# Patient Record
Sex: Male | Born: 1945 | Race: Black or African American | Hispanic: No | Smoking: Never smoker
Health system: Southern US, Community
[De-identification: ages and names within clinical notes are randomized; demographics above are authoritative.]

## PROBLEM LIST (undated history)

## (undated) DIAGNOSIS — I639 Cerebral infarction, unspecified: Secondary | ICD-10-CM

## (undated) DIAGNOSIS — I10 Essential (primary) hypertension: Secondary | ICD-10-CM

---

## 2001-12-26 ENCOUNTER — Emergency Department (HOSPITAL_COMMUNITY): Admission: AC | Admit: 2001-12-26 | Discharge: 2001-12-27 | Payer: Self-pay

## 2001-12-27 ENCOUNTER — Encounter: Payer: Self-pay | Admitting: Emergency Medicine

## 2003-06-22 ENCOUNTER — Emergency Department (HOSPITAL_COMMUNITY): Admission: AD | Admit: 2003-06-22 | Discharge: 2003-06-22 | Payer: Self-pay | Admitting: Family Medicine

## 2003-06-23 ENCOUNTER — Emergency Department (HOSPITAL_COMMUNITY): Admission: EM | Admit: 2003-06-23 | Discharge: 2003-06-23 | Payer: Self-pay | Admitting: Family Medicine

## 2003-11-05 ENCOUNTER — Inpatient Hospital Stay (HOSPITAL_COMMUNITY): Admission: AC | Admit: 2003-11-05 | Discharge: 2003-11-09 | Payer: Self-pay

## 2010-06-08 ENCOUNTER — Encounter (HOSPITAL_COMMUNITY): Payer: Self-pay | Admitting: Radiology

## 2010-06-08 ENCOUNTER — Inpatient Hospital Stay (HOSPITAL_COMMUNITY)
Admission: EM | Admit: 2010-06-08 | Discharge: 2010-06-12 | DRG: 065 | Disposition: A | Discharge status: Skilled Nursing Facility | Payer: PRIVATE HEALTH INSURANCE | Attending: Internal Medicine | Admitting: Internal Medicine

## 2010-06-08 ENCOUNTER — Emergency Department (HOSPITAL_COMMUNITY): Payer: PRIVATE HEALTH INSURANCE

## 2010-06-08 DIAGNOSIS — Z23 Encounter for immunization: Secondary | ICD-10-CM

## 2010-06-08 DIAGNOSIS — E860 Dehydration: Secondary | ICD-10-CM | POA: Diagnosis present

## 2010-06-08 DIAGNOSIS — R000 Tachycardia, unspecified: Secondary | ICD-10-CM | POA: Diagnosis present

## 2010-06-08 DIAGNOSIS — E119 Type 2 diabetes mellitus without complications: Secondary | ICD-10-CM | POA: Diagnosis present

## 2010-06-08 DIAGNOSIS — Z8673 Personal history of transient ischemic attack (TIA), and cerebral infarction without residual deficits: Secondary | ICD-10-CM

## 2010-06-08 DIAGNOSIS — Z7982 Long term (current) use of aspirin: Secondary | ICD-10-CM

## 2010-06-08 DIAGNOSIS — I672 Cerebral atherosclerosis: Secondary | ICD-10-CM | POA: Diagnosis present

## 2010-06-08 DIAGNOSIS — I10 Essential (primary) hypertension: Secondary | ICD-10-CM | POA: Diagnosis present

## 2010-06-08 DIAGNOSIS — E785 Hyperlipidemia, unspecified: Secondary | ICD-10-CM | POA: Diagnosis present

## 2010-06-08 DIAGNOSIS — Z794 Long term (current) use of insulin: Secondary | ICD-10-CM

## 2010-06-08 DIAGNOSIS — N179 Acute kidney failure, unspecified: Secondary | ICD-10-CM | POA: Diagnosis present

## 2010-06-08 DIAGNOSIS — G8110 Spastic hemiplegia affecting unspecified side: Secondary | ICD-10-CM | POA: Diagnosis present

## 2010-06-08 DIAGNOSIS — R1310 Dysphagia, unspecified: Secondary | ICD-10-CM | POA: Diagnosis present

## 2010-06-08 DIAGNOSIS — I6350 Cerebral infarction due to unspecified occlusion or stenosis of unspecified cerebral artery: Principal | ICD-10-CM | POA: Diagnosis present

## 2010-06-08 DIAGNOSIS — R4701 Aphasia: Secondary | ICD-10-CM | POA: Diagnosis present

## 2010-06-08 HISTORY — DX: Essential (primary) hypertension: I10

## 2010-06-08 HISTORY — DX: Cerebral infarction, unspecified: I63.9

## 2010-06-08 LAB — GLUCOSE, CAPILLARY
Glucose-Capillary: 207 mg/dL — ABNORMAL HIGH (ref 70–99)
Glucose-Capillary: 234 mg/dL — ABNORMAL HIGH (ref 70–99)
Glucose-Capillary: 311 mg/dL — ABNORMAL HIGH (ref 70–99)

## 2010-06-08 LAB — CBC
HCT: 41.2 % (ref 39.0–52.0)
Hemoglobin: 13.9 g/dL (ref 13.0–17.0)
MCH: 31 pg (ref 26.0–34.0)
MCHC: 33.7 g/dL (ref 30.0–36.0)
MCV: 92 fL (ref 78.0–100.0)
RBC: 4.48 MIL/uL (ref 4.22–5.81)

## 2010-06-08 LAB — URINALYSIS, ROUTINE W REFLEX MICROSCOPIC
Hgb urine dipstick: NEGATIVE
Ketones, ur: 15 mg/dL — AB
Leukocytes, UA: NEGATIVE
Protein, ur: NEGATIVE mg/dL
Urine Glucose, Fasting: >1000 mg/dL — AB
pH: 5 (ref 5.0–8.0)

## 2010-06-08 LAB — COMPREHENSIVE METABOLIC PANEL
AST: 12 U/L (ref 0–37)
CO2: 24 mEq/L (ref 19–32)
Calcium: 9 mg/dL (ref 8.4–10.5)
Chloride: 108 mEq/L (ref 96–112)
Creatinine, Ser: 1.79 mg/dL — ABNORMAL HIGH (ref 0.4–1.5)
GFR calc non Af Amer: 38 mL/min — ABNORMAL LOW (ref >60)
Glucose, Bld: 328 mg/dL — ABNORMAL HIGH (ref 70–99)
Total Bilirubin: 0.9 mg/dL (ref 0.3–1.2)

## 2010-06-08 LAB — DIFFERENTIAL
Lymphocytes Relative: 27 % (ref 12–46)
Lymphs Abs: 1.4 10*3/uL (ref 0.7–4.0)
Monocytes Absolute: 0.4 10*3/uL (ref 0.1–1.0)
Monocytes Relative: 7 % (ref 3–12)
Neutro Abs: 3.3 10*3/uL (ref 1.7–7.7)
Neutrophils Relative %: 63 % (ref 43–77)

## 2010-06-08 LAB — RAPID URINE DRUG SCREEN, HOSP PERFORMED
Barbiturates: NOT DETECTED
Benzodiazepines: NOT DETECTED
Cocaine: NOT DETECTED

## 2010-06-08 LAB — CK TOTAL AND CKMB (NOT AT ARMC)
CK, MB: 2 ng/mL (ref 0.3–4.0)
Relative Index: INVALID (ref 0.0–2.5)
Total CK: 62 U/L (ref 7–232)

## 2010-06-08 LAB — URINE MICROSCOPIC-ADD ON

## 2010-06-08 LAB — TROPONIN I: Troponin I: 0.02 ng/mL (ref 0.00–0.06)

## 2010-06-08 LAB — POCT CARDIAC MARKERS: Myoglobin, poc: 149 ng/mL (ref 12–200)

## 2010-06-09 ENCOUNTER — Inpatient Hospital Stay (HOSPITAL_COMMUNITY): Payer: PRIVATE HEALTH INSURANCE

## 2010-06-09 LAB — DIFFERENTIAL
Basophils Absolute: 0 10*3/uL (ref 0.0–0.1)
Lymphocytes Relative: 22 % (ref 12–46)
Lymphs Abs: 1.2 10*3/uL (ref 0.7–4.0)
Neutro Abs: 3.7 10*3/uL (ref 1.7–7.7)
Neutrophils Relative %: 71 % (ref 43–77)

## 2010-06-09 LAB — CBC
HCT: 41.6 % (ref 39.0–52.0)
MCV: 91.8 fL (ref 78.0–100.0)
RBC: 4.53 MIL/uL (ref 4.22–5.81)
WBC: 5.2 10*3/uL (ref 4.0–10.5)

## 2010-06-09 LAB — CARDIAC PANEL(CRET KIN+CKTOT+MB+TROPI)
CK, MB: 2.5 ng/mL (ref 0.3–4.0)
CK, MB: 2.9 ng/mL (ref 0.3–4.0)
Total CK: 80 U/L (ref 7–232)
Total CK: 89 U/L (ref 7–232)
Troponin I: 0.04 ng/mL (ref 0.00–0.06)

## 2010-06-09 LAB — GLUCOSE, CAPILLARY
Glucose-Capillary: 304 mg/dL — ABNORMAL HIGH (ref 70–99)
Glucose-Capillary: 159 mg/dL — ABNORMAL HIGH (ref 70–99)

## 2010-06-09 LAB — BASIC METABOLIC PANEL
BUN: 33 mg/dL — ABNORMAL HIGH (ref 6–23)
Chloride: 113 mEq/L — ABNORMAL HIGH (ref 96–112)
Glucose, Bld: 300 mg/dL — ABNORMAL HIGH (ref 70–99)
Potassium: 4.2 mEq/L (ref 3.5–5.1)
Sodium: 143 mEq/L (ref 135–145)

## 2010-06-09 LAB — LIPID PANEL
Cholesterol: 212 mg/dL — ABNORMAL HIGH (ref 0–200)
HDL: 31 mg/dL — ABNORMAL LOW (ref >39)
LDL Cholesterol: 147 mg/dL — ABNORMAL HIGH (ref 0–99)
Total CHOL/HDL Ratio: 6.8 RATIO
Triglycerides: 172 mg/dL — ABNORMAL HIGH (ref <150)
VLDL: 34 mg/dL (ref 0–40)

## 2010-06-09 LAB — TSH: TSH: 0.344 u[IU]/mL — ABNORMAL LOW (ref 0.350–4.500)

## 2010-06-10 ENCOUNTER — Inpatient Hospital Stay (HOSPITAL_COMMUNITY): Payer: PRIVATE HEALTH INSURANCE

## 2010-06-10 LAB — CBC
Hemoglobin: 11.8 g/dL — ABNORMAL LOW (ref 13.0–17.0)
MCH: 30.1 pg (ref 26.0–34.0)
MCHC: 32.8 g/dL (ref 30.0–36.0)
Platelets: 139 10*3/uL — ABNORMAL LOW (ref 150–400)

## 2010-06-10 LAB — GLUCOSE, CAPILLARY
Glucose-Capillary: 104 mg/dL — ABNORMAL HIGH (ref 70–99)
Glucose-Capillary: 125 mg/dL — ABNORMAL HIGH (ref 70–99)
Glucose-Capillary: 324 mg/dL — ABNORMAL HIGH (ref 70–99)

## 2010-06-10 LAB — BASIC METABOLIC PANEL
CO2: 23 mEq/L (ref 19–32)
GFR calc Af Amer: >60 mL/min (ref >60)
Sodium: 145 mEq/L (ref 135–145)

## 2010-06-10 LAB — SODIUM, URINE, RANDOM: Sodium, Ur: 74 mEq/L

## 2010-06-11 DIAGNOSIS — I6789 Other cerebrovascular disease: Secondary | ICD-10-CM

## 2010-06-11 LAB — GLUCOSE, CAPILLARY
Glucose-Capillary: 115 mg/dL — ABNORMAL HIGH (ref 70–99)
Glucose-Capillary: 132 mg/dL — ABNORMAL HIGH (ref 70–99)
Glucose-Capillary: 166 mg/dL — ABNORMAL HIGH (ref 70–99)

## 2010-06-11 NOTE — H&P (Signed)
NAME:  Robert Mcclure, Robert Mcclure NO.:  [id number]  MEDICAL RECORD NO.:  [id number]           PATIENT TYPE:  E  LOCATION:  [facility]                         FACILITY:  [facility]  PHYSICIAN:  Vania Rea, M.D. DATE OF BIRTH:  1945/07/11  DATE OF ADMISSION:  06/08/2010 DATE OF DISCHARGE:                             HISTORY & PHYSICAL   PRIMARY CARE PHYSICIAN:  Jarome Matin, MD  CHIEF COMPLAINT:  Difficulty walking and sitting.  HISTORY OF PRESENT ILLNESS:  This is a 65 year old African American gentleman with a history of left brain stroke in 2005 and mild right hemiplegia and dysphasia who lives alone and has a nurse's aide who visits daily to help with cooking, cleaning, and activities of daily living.  The patient says he has not noticed any change in his status. However, his nurse's aide called EMS and called his friend who helps to look after his fares because she said he had difficulty sitting up and had difficulty walking.  His friend who is present at the interview also indicates that his speech is different.  The patient denies any headache, chest pain, shortness of breath.  He denies any falls.  He denies any frequency or dysuria.  He says he has been eating as normal.  PAST MEDICAL HISTORY: 1. Hypertension. 2. Diabetes. 3. Stroke as noted above.  MEDICATIONS: 1. Lisinopril/HCTZ 20/25 one daily. 2. Metformin 1000 mg twice daily. 3. Amlodipine 10 mg daily. 4. Clonidine 0.3 mg 3 times daily. 5. Actos 30 mg daily. 6. Glipizide 10 mg daily.  ALLERGIES:  No known drug allergies.  SOCIAL HISTORY:  Denies tobacco, alcohol, or illicit drug use.  Lives alone as noted above.  He is divorced.  He has 3 grown children.  He has a friend who have a lady friend who helps to look after him and is present at the interview.  FAMILY HISTORY:  Significant for a mother who had breast cancer.  Father had lung cancer.  Sister who had a stroke in her 30s.  REVIEW OF  SYSTEMS:  Other than noted above, significant only for the fact that his blood sugars tend to run in the 200s and 300s despite taking his medications regularly.  His friend feels that he should be on insulin.  Other than this, a complete review of systems is unremarkable.  PHYSICAL EXAMINATION:  GENERAL:  Pleasant middle-aged African American gentleman, reclining in the stretcher, very difficulty understanding his speech because he takes a long time to get it out and it is not always clear what he is saying. VITAL SIGNS:  His temperature is 97.6, pulse 84, respiration 19, blood pressure 153/99, he is saturating 100% on room air. HEENT:  His pupils are round and equal.  Mucous membranes are pink. Anicteric.  No cervical lymphadenopathy.  He does not have facial asymmetry.  No thyromegaly or carotid bruits. CHEST:  Clear to auscultation bilaterally. CARDIOVASCULAR:  Regular rhythm.  No murmur. ABDOMEN:  Mildly obese, soft, nontender.  No masses. EXTREMITIES:  He has wasting of the small muscles of the right hand and wasting of the thenar eminence of the left hand, has arthritic deformities of  both knees, right greater than left.  He has no edema. CENTRAL NERVOUS SYSTEM:  He has speech impairment as noted above, but no facial asymmetry.  He does appear to be edentulous though.  His power in the right upper extremity is 3/5, power in the left upper extremity is 4/5 with 5/5 being full power, but he cannot lift both arms against gravity easily.  It is difficult to assess the strength of the lower extremities since he seems to have difficulty cooperating with the exam. Reflexes were equal and brisk in both upper extremities which could not be elicited in lower extremities.  Babinski appeared to be upgoing on the right upper extremity and downgoing on the left lower extremity. Gait was not tested.  LABORATORY DATA:  CBC is unremarkable with a white count of 5.2, hemoglobin 13.9, and  platelets 160 with a normal differential.  His sodium is 141, potassium 4.8, chloride 108, CO2 of 24, glucose 328, BUN 49, and creatinine elevated to 1.79.  His calcium was 9.0 and his liver functions were unremarkable.  Urine drug screen was negative for all tested constituents.  Urinalysis had greater than 1000 glucose, but was otherwise unremarkable.  Urine microscopy was unremarkable.  IMAGING DATA:  Two-view chest x-ray showed no acute finding.  CT scan of the brain showed an age indeterminate lacunar infarct involving the anterior limb of the right internal capsule, new since 2005, old left MCA distribution cerebral infarct, and chronic small vessel ischemic disease.  MRI of the brain without contrast showed multifocal areas of right basal ganglia infarction within the right MCA lenticulostriate territory.  No hemorrhage or midline shift.  ASSESSMENT: 1. Acute right basal ganglia infarct. 2. Diabetes type 2 uncontrolled. 3. Hypertension. 4. Renal failure probably acute due to dehydration.  PLAN: 1. We will admit this gentleman to the Stroke Unit for further stroke     workup and risks stratification.  I have discussed with he and his     friend the importance of having his blood pressure, blood sugars,     and his lipids well-controlled.  Neurologists was already been     consulted and we await her input.  Since this gentleman has     automatically failed a swallowing screen, he will be evaluated by     the speech therapist and his diet adjusted     accordingly.  For the time being, we will keep him on a sensitive     sliding scale insulin for management of his diabetes. 2. Other plans as per orders.     Vania Rea, M.D.     LC/MEDQ  D:  06/08/2010  T:  06/08/2010  Job:  [id number]  cc:   Jarome Matin, M.D.  Electronically Signed by Vania Rea M.D. on 06/11/2010 04:54:09 AM

## 2010-06-12 LAB — GLUCOSE, CAPILLARY: Glucose-Capillary: 157 mg/dL — ABNORMAL HIGH (ref 70–99)

## 2010-06-14 NOTE — Discharge Summary (Signed)
NAME:  Robert Mcclure, Robert Mcclure NO.:  [id number]  MEDICAL RECORD NO.:  [id number]           PATIENT TYPE:  I  LOCATION:  3703                         FACILITY:  [facility]  PHYSICIAN:  Andreas Blower, MD       DATE OF BIRTH:  [birth year]  DATE OF ADMISSION:  06/08/2010 DATE OF DISCHARGE:                        DISCHARGE SUMMARY - REFERRING   PRIMARY CARE PHYSICIAN:  Jarome Matin, MD  DISCHARGE DIAGNOSES: 1. Acute right basal ganglia infarct within the right middle cerebral     artery/lenticulostriate territory. 2. Acute renal failure, resolved. 3. Type 2 diabetes. 4. Hypertension. 5. Dysphagia.  Currently on a dysphagia III diet and thin liquids. 6. Tachycardia resolved. 7. Suppressed TSH was normal free T4. 8. History of CVA.  DISCHARGE MEDICATIONS: 1. Acetaminophen 650 mg every 6 years as needed. 2. Aspirin 325 mg p.o. daily. 3. Glipizide 5 mg p.o. twice daily with meals. 4. Metoprolol 25 mg p.o. twice daily. 5. Simvastatin 20 mg p.o. daily. 6. Thick-It food thickener 227 g as needed. 7. Pioglitazone 30 mg p.o. daily. 8. Amlodipine 10 mg p.o. daily. 9. Clonidine 0.3 mg p.o. 3 times a day. 10.Lantus 5 units subcu daily. 11.NovoLog 1-9 units subcu before meals and at bedtime.  BRIEF ADMITTING HISTORY AND PHYSICAL:  Robert Mcclure is a 65 year old African American gentleman with history of CVA, hypertension, and diabetes who presented on June 08, 2010, with difficulty walking and sitting.  RADIOLOGY/IMAGING DATA:  The patient had a chest x-ray two-view on June 08, 2010, which shows no acute findings.  The patient had a head CT without contrast, which was age indeterminate lacunar infarct involving the anterior limb of the right internal capsule, which is new since 2005.  Old left MCA distribution of cerebellar infarct.  Chronic small-vessel disease.  The patient had MRI of the brain, which showed multifocal areas of right basal ganglia infarct  within the right MCA/lenticulostriate territory. No hemorrhage or midline shift.  The patient had a renal ultrasound on June 09, 2010, which shows no hydronephrosis.  Echogenicity of right parenchyma is within normal limits.  Slightly prominent prostate.  The patient had 2-D echocardiogram, which showed cavity size was normal, wall thickness was increased in the pattern of mild to moderate LVH. There was moderate focal basal hypertrophy of the septum.  Systolic function was normal.  Ejection fraction was 55-65%.  Wall motion was normal.  There were no regional wall motion abnormalities.  Aortic valve was mildly calcified.  Pulmonary artery peak pressure was 34 mmHg.  The patient had carotid Dopplers on June 09, 2010, which showed no evidence of significant ICA stenosis.  CONSULTATIONS:  [facility] evaluated the patient during the course of the hospital stay.  HOSPITAL COURSE: 1. Acute right basil ganglia CVA.  Neurology was consulted.  The     patient had the above imaging.  The patient was seen by Neurology     and started the patient on aspirin.  Given his dysphagia, Speech     Therapy was also consulted and he was started on a dysphagia diet.     The patient was evaluated by physical therapy who thought that  the     patient would benefit from rehab at skilled nursing facility, which     will be arranged at the time of discharge. 2. Acute renal failure resolved most likely prerenal in etiology. 3. Hypertension.  Blood pressure improved during the course of the     hospital stay.  The patient had his lisinopril and     hydrochlorothiazide medication held and was started on metoprolol.     Further titration of antihypertensive medications should be done as     an outpatient. 4. Dysphagia.  The patient was evaluated by Speech Therapy and was     started on a dysphagia III diet with thin liquids. 5. Suppressed TSH with normal free T4.  The patient to have his labs      checked as an outpatient. 6. Diabetes.  The patient was continued on home dose of pioglitazone.     His glipizide was changed to 5 mg twice daily.  The patient was     started on Lantus and low-dose NovoLog for coverage during the     course of the hospital stay.  Further titration of that diabetic     medications as outpatient. 7. Tachycardia resolved during the course of the hospital stay.  The     patient was started on metoprolol 25 mg p.o. twice daily.  DISPOSITION AND FOLLOWUP:  The patient to follow up with Neurology as outpatient in 1-2 months.  The patient to follow up with his primary care physician within in 1-2 weeks.  Time spent on discharge talking to the patient, talking to consultants, and coordinating care was 35 minutes.     Andreas Blower, MD     SR/MEDQ  D:  06/12/2010  T:  06/12/2010  Job:  [id number]  cc:   Jarome Matin, M.D.  Electronically Signed by Wardell Heath Jaquayla Hege  on 06/14/2010 09:08:46 PM

## 2010-06-15 NOTE — Consult Note (Signed)
  NAME:  Robert Mcclure, BLANKENHORN NO.:  [id number]  MEDICAL RECORD NO.:  [id number]           PATIENT TYPE:  E  LOCATION:  [facility]                         FACILITY:  [facility]  PHYSICIAN:  Levert Feinstein, MD          DATE OF BIRTH:  December 10, 1945  DATE OF CONSULTATION: DATE OF DISCHARGE:                                CONSULTATION   CHIEF COMPLAINT:  Acute stroke.  HISTORY OF PRESENT ILLNESS:  The patient is a 65 year old right-handed African American male with past medical history of left MCA stroke in 2005.  At baseline, he has aphasia, spastic right hemiparesis, but ambulatory, takes care of personal needs, has aides coming regularly. Monday morning, when the aides came in, he was found to slump over, had increased gait difficulty.  Over the past 2 days, he had persistent gait difficulty, left-sided weakness, prompted ED visit.  MRI of the brain demonstrated a new acute stroke in right basal ganglion.  REVIEW OF SYSTEMS:  Pertinent as above.  PAST MEDICAL HISTORY: 1. Hypertension. 2. Diabetes. 3. Stroke.  MEDICATIONS:  Lisinopril, metformin, amlodipine, clonidine, Actos, glipizide.  ALLERGIES:  No known drug allergy.  FAMILY HISTORY:  Mother had breast cancer, father had colon cancer, in their 46s.  Denies smoking, drinking, illicit drug use.  PHYSICAL EXAMINATION:  VITAL SIGNS:  Temperature 97.6, blood pressure 153/99, heart rate of 84. CARDIAC:  Regular rate and rhythm. NEUROLOGIC:  He has paraphasic errors, slow to response, and following commands. Cranial nerves II through XII:  He had right visual field cut.  Pupils were equal, round, and reactive to light.  Extraocular movements were full.  Facial sensation was normal.  No significant asymmetry.  Uvula and tongue midline.  Head turning, shoulder shrugging normal and symmetric. Motor:  Mild spastic right hemiparesis, upper extremity 4+/5, lower extremity 4/5.  There was slight drift of left upper and  lower extremities. Sensory:  Not reliable and there was no extinction, no dysmetria.  LABORATORY DATA:  Creatinine 1.8.  Normal CBC.  Elevated glucose at 328. UDS negative.  INR 1.03.  ASSESSMENT/PLAN:  Acute right basal ganglion stroke, multiple vascular risk factors, previous stroke, hypertension, poorly controlled diabetes, hyperlipidemia. 1. Complete stroke evaluation including echo, ultrasound of carotid     artery. 2. Aspirin 325 mg every day. 3. Physical therapy, occupational therapy.     Levert Feinstein, MD     YY/MEDQ  D:  06/08/2010  T:  06/09/2010  Job:  [id number]  Electronically Signed by Levert Feinstein MD on 06/15/2010 07:46:22 AM

## 2012-01-23 ENCOUNTER — Encounter (HOSPITAL_COMMUNITY): Payer: Self-pay | Admitting: Emergency Medicine

## 2012-01-23 ENCOUNTER — Emergency Department (HOSPITAL_COMMUNITY): Payer: PRIVATE HEALTH INSURANCE

## 2012-01-23 ENCOUNTER — Inpatient Hospital Stay (HOSPITAL_COMMUNITY)
Admission: EM | Admit: 2012-01-23 | Discharge: 2012-01-27 | DRG: 683 | Disposition: A | Discharge status: Home Health | Payer: PRIVATE HEALTH INSURANCE | Attending: Internal Medicine | Admitting: Internal Medicine

## 2012-01-23 DIAGNOSIS — F0150 Vascular dementia without behavioral disturbance: Secondary | ICD-10-CM | POA: Diagnosis present

## 2012-01-23 DIAGNOSIS — I69959 Hemiplegia and hemiparesis following unspecified cerebrovascular disease affecting unspecified side: Secondary | ICD-10-CM

## 2012-01-23 DIAGNOSIS — Z794 Long term (current) use of insulin: Secondary | ICD-10-CM

## 2012-01-23 DIAGNOSIS — I672 Cerebral atherosclerosis: Secondary | ICD-10-CM | POA: Diagnosis present

## 2012-01-23 DIAGNOSIS — D649 Anemia, unspecified: Secondary | ICD-10-CM | POA: Diagnosis present

## 2012-01-23 DIAGNOSIS — N182 Chronic kidney disease, stage 2 (mild): Secondary | ICD-10-CM | POA: Diagnosis present

## 2012-01-23 DIAGNOSIS — N179 Acute kidney failure, unspecified: Secondary | ICD-10-CM | POA: Diagnosis present

## 2012-01-23 DIAGNOSIS — I674 Hypertensive encephalopathy: Secondary | ICD-10-CM | POA: Diagnosis present

## 2012-01-23 DIAGNOSIS — I69921 Dysphasia following unspecified cerebrovascular disease: Secondary | ICD-10-CM

## 2012-01-23 DIAGNOSIS — E1169 Type 2 diabetes mellitus with other specified complication: Secondary | ICD-10-CM | POA: Diagnosis present

## 2012-01-23 DIAGNOSIS — E119 Type 2 diabetes mellitus without complications: Secondary | ICD-10-CM | POA: Diagnosis present

## 2012-01-23 DIAGNOSIS — Z79899 Other long term (current) drug therapy: Secondary | ICD-10-CM

## 2012-01-23 DIAGNOSIS — I10 Essential (primary) hypertension: Secondary | ICD-10-CM | POA: Diagnosis present

## 2012-01-23 DIAGNOSIS — I129 Hypertensive chronic kidney disease with stage 1 through stage 4 chronic kidney disease, or unspecified chronic kidney disease: Principal | ICD-10-CM | POA: Diagnosis present

## 2012-01-23 DIAGNOSIS — I160 Hypertensive urgency: Secondary | ICD-10-CM

## 2012-01-23 DIAGNOSIS — I639 Cerebral infarction, unspecified: Secondary | ICD-10-CM | POA: Diagnosis present

## 2012-01-23 DIAGNOSIS — R569 Unspecified convulsions: Secondary | ICD-10-CM

## 2012-01-23 HISTORY — DX: Cerebral infarction, unspecified: I63.9

## 2012-01-23 LAB — TROPONIN I: Troponin I: <0.3 ng/mL (ref <0.30)

## 2012-01-23 LAB — COMPREHENSIVE METABOLIC PANEL
ALT: 11 U/L (ref 0–53)
Alkaline Phosphatase: 48 U/L (ref 39–117)
CO2: 23 mEq/L (ref 19–32)
GFR calc Af Amer: 70 mL/min — ABNORMAL LOW (ref >90)
GFR calc non Af Amer: 60 mL/min — ABNORMAL LOW (ref >90)
Glucose, Bld: 211 mg/dL — ABNORMAL HIGH (ref 70–99)
Potassium: 4 mEq/L (ref 3.5–5.1)
Sodium: 141 mEq/L (ref 135–145)

## 2012-01-23 LAB — CBC WITH DIFFERENTIAL/PLATELET
Eosinophils Relative: 0 % (ref 0–5)
Hemoglobin: 12.6 g/dL — ABNORMAL LOW (ref 13.0–17.0)
Lymphocytes Relative: 12 % (ref 12–46)
Lymphs Abs: 1 10*3/uL (ref 0.7–4.0)
MCV: 92.6 fL (ref 78.0–100.0)
Neutrophils Relative %: 83 % — ABNORMAL HIGH (ref 43–77)
Platelets: 172 10*3/uL (ref 150–400)
RBC: 4.03 MIL/uL — ABNORMAL LOW (ref 4.22–5.81)
WBC: 8.8 10*3/uL (ref 4.0–10.5)

## 2012-01-23 LAB — CREATININE, SERUM
Creatinine, Ser: 1.37 mg/dL — ABNORMAL HIGH (ref 0.50–1.35)
GFR calc non Af Amer: 52 mL/min — ABNORMAL LOW (ref >90)

## 2012-01-23 LAB — CBC
Hemoglobin: 11.9 g/dL — ABNORMAL LOW (ref 13.0–17.0)
MCHC: 33.4 g/dL (ref 30.0–36.0)
Platelets: 162 10*3/uL (ref 150–400)

## 2012-01-23 MED ORDER — LOSARTAN POTASSIUM 50 MG PO TABS: 100.0000 mg | ORAL_TABLET | Freq: Every day | ORAL | Status: DC

## 2012-01-23 MED ORDER — ONDANSETRON HCL 4 MG PO TABS: 4.0000 mg | ORAL_TABLET | Freq: Four times a day (QID) | ORAL | Status: DC | PRN

## 2012-01-23 MED ORDER — LOSARTAN POTASSIUM 50 MG PO TABS
100.0000 mg | ORAL_TABLET | Freq: Every day | ORAL | Status: DC
  Administered 2012-01-24: 100 mg via ORAL
  Filled 2012-01-23 (×2): qty 2

## 2012-01-23 MED ORDER — ONDANSETRON HCL 4 MG/2ML IJ SOLN: 4.0000 mg | Freq: Four times a day (QID) | INTRAMUSCULAR | Status: DC | PRN

## 2012-01-23 MED ORDER — ZOLPIDEM TARTRATE 5 MG PO TABS
5.0000 mg | ORAL_TABLET | Freq: Every day | ORAL | Status: DC
  Administered 2012-01-23 – 2012-01-25 (×3): 5 mg via ORAL
  Filled 2012-01-23 (×3): qty 1

## 2012-01-23 MED ORDER — LABETALOL HCL 5 MG/ML IV SOLN
20.0000 mg | Freq: Once | INTRAVENOUS | Status: AC
  Administered 2012-01-23: 20 mg via INTRAVENOUS
  Filled 2012-01-23: qty 4

## 2012-01-23 MED ORDER — BISACODYL 5 MG PO TBEC: 5.0000 mg | DELAYED_RELEASE_TABLET | Freq: Every day | ORAL | Status: DC | PRN

## 2012-01-23 MED ORDER — GLIPIZIDE ER 10 MG PO TB24
10.0000 mg | ORAL_TABLET | Freq: Two times a day (BID) | ORAL | Status: DC
  Administered 2012-01-24: 10 mg via ORAL
  Filled 2012-01-23 (×3): qty 1

## 2012-01-23 MED ORDER — ASPIRIN 325 MG PO TABS
325.0000 mg | ORAL_TABLET | Freq: Every day | ORAL | Status: DC
  Administered 2012-01-24 – 2012-01-27 (×4): 325 mg via ORAL
  Filled 2012-01-23 (×4): qty 1

## 2012-01-23 MED ORDER — SODIUM CHLORIDE 0.9 % IJ SOLN
3.0000 mL | Freq: Two times a day (BID) | INTRAMUSCULAR | Status: DC
  Administered 2012-01-23 – 2012-01-27 (×8): 3 mL via INTRAVENOUS

## 2012-01-23 MED ORDER — AMLODIPINE BESYLATE 10 MG PO TABS
10.0000 mg | ORAL_TABLET | Freq: Every day | ORAL | Status: DC
  Administered 2012-01-24 – 2012-01-27 (×4): 10 mg via ORAL
  Filled 2012-01-23 (×4): qty 1

## 2012-01-23 MED ORDER — DONEPEZIL HCL 10 MG PO TABS
10.0000 mg | ORAL_TABLET | Freq: Every day | ORAL | Status: DC
  Administered 2012-01-24 – 2012-01-27 (×4): 10 mg via ORAL
  Filled 2012-01-23 (×4): qty 1

## 2012-01-23 MED ORDER — LABETALOL HCL 200 MG PO TABS
200.0000 mg | ORAL_TABLET | Freq: Two times a day (BID) | ORAL | Status: DC
  Administered 2012-01-23 – 2012-01-27 (×8): 200 mg via ORAL
  Filled 2012-01-23 (×9): qty 1

## 2012-01-23 MED ORDER — INSULIN GLARGINE 100 UNIT/ML ~~LOC~~ SOLN
36.0000 [IU] | Freq: Every day | SUBCUTANEOUS | Status: DC
  Administered 2012-01-23 – 2012-01-25 (×3): 36 [IU] via SUBCUTANEOUS

## 2012-01-23 MED ORDER — SODIUM CHLORIDE 0.9 % IV SOLN: 250.0000 mL | INTRAVENOUS | Status: DC | PRN

## 2012-01-23 MED ORDER — FUROSEMIDE 20 MG PO TABS: 20.0000 mg | ORAL_TABLET | ORAL | Status: DC

## 2012-01-23 MED ORDER — INSULIN ASPART 100 UNIT/ML ~~LOC~~ SOLN
0.0000 [IU] | Freq: Every day | SUBCUTANEOUS | Status: DC
  Administered 2012-01-23 – 2012-01-25 (×2): 2 [IU] via SUBCUTANEOUS

## 2012-01-23 MED ORDER — LABETALOL HCL 5 MG/ML IV SOLN
2.0000 mg/min | INTRAVENOUS | Status: DC
  Filled 2012-01-23: qty 100

## 2012-01-23 MED ORDER — ENOXAPARIN SODIUM 40 MG/0.4ML ~~LOC~~ SOLN
40.0000 mg | SUBCUTANEOUS | Status: DC
  Administered 2012-01-23 – 2012-01-26 (×4): 40 mg via SUBCUTANEOUS
  Filled 2012-01-23 (×5): qty 0.4

## 2012-01-23 MED ORDER — METOPROLOL TARTRATE 100 MG PO TABS
100.0000 mg | ORAL_TABLET | Freq: Two times a day (BID) | ORAL | Status: DC
  Administered 2012-01-23: 100 mg via ORAL
  Filled 2012-01-23: qty 1

## 2012-01-23 MED ORDER — CLONIDINE HCL 0.3 MG PO TABS
0.3000 mg | ORAL_TABLET | Freq: Three times a day (TID) | ORAL | Status: DC
  Administered 2012-01-23 – 2012-01-27 (×11): 0.3 mg via ORAL
  Filled 2012-01-23 (×14): qty 1

## 2012-01-23 MED ORDER — INSULIN ASPART 100 UNIT/ML ~~LOC~~ SOLN
0.0000 [IU] | Freq: Three times a day (TID) | SUBCUTANEOUS | Status: DC
  Administered 2012-01-24 (×2): 2 [IU] via SUBCUTANEOUS
  Administered 2012-01-25 – 2012-01-26 (×3): 3 [IU] via SUBCUTANEOUS
  Administered 2012-01-26: 11 [IU] via SUBCUTANEOUS

## 2012-01-23 MED ORDER — SODIUM CHLORIDE 0.9 % IJ SOLN: 3.0000 mL | INTRAMUSCULAR | Status: DC | PRN

## 2012-01-23 MED ORDER — HYDRALAZINE HCL 20 MG/ML IJ SOLN
10.0000 mg | INTRAMUSCULAR | Status: DC | PRN
  Filled 2012-01-23: qty 0.5

## 2012-01-23 MED ORDER — ACETAMINOPHEN 325 MG PO TABS: 650.0000 mg | ORAL_TABLET | Freq: Four times a day (QID) | ORAL | Status: DC | PRN

## 2012-01-23 MED ORDER — PIOGLITAZONE HCL 30 MG PO TABS
30.0000 mg | ORAL_TABLET | Freq: Every day | ORAL | Status: DC
  Administered 2012-01-24 – 2012-01-27 (×4): 30 mg via ORAL
  Filled 2012-01-23 (×5): qty 1

## 2012-01-23 MED ORDER — HYDRALAZINE HCL 20 MG/ML IJ SOLN
10.0000 mg | Freq: Once | INTRAMUSCULAR | Status: AC
  Administered 2012-01-23: 10 mg via INTRAVENOUS
  Filled 2012-01-23: qty 1

## 2012-01-23 MED ORDER — LABETALOL HCL 5 MG/ML IV SOLN
10.0000 mg | INTRAVENOUS | Status: DC | PRN
  Administered 2012-01-23: 10 mg via INTRAVENOUS
  Filled 2012-01-23: qty 4

## 2012-01-23 MED ORDER — ACETAMINOPHEN 650 MG RE SUPP: 650.0000 mg | Freq: Four times a day (QID) | RECTAL | Status: DC | PRN

## 2012-01-23 MED ORDER — SIMVASTATIN 20 MG PO TABS
20.0000 mg | ORAL_TABLET | Freq: Every evening | ORAL | Status: DC
  Administered 2012-01-23 – 2012-01-24 (×2): 20 mg via ORAL
  Filled 2012-01-23 (×3): qty 1

## 2012-01-23 MED ORDER — SODIUM CHLORIDE 0.9 % IV SOLN
INTRAVENOUS | Status: DC
  Administered 2012-01-23: 125 mL via INTRAVENOUS

## 2012-01-23 NOTE — ED Notes (Signed)
Nephew came home from work and his uncle was shaking alert called EMS [facility] 151 with BP 240/130 and 200/121 EKG NSR.

## 2012-01-23 NOTE — ED Notes (Signed)
Pt given urinal and asked to call us to assist with a sample as soon as able.

## 2012-01-23 NOTE — Consult Note (Signed)
Name: Robert Mcclure MRN: [id number] DOB: [birth year]    LOS: 0  Referring Provider:  [facility] Reason for Referral:  Hypertensive urgency  PULMONARY / CRITICAL CARE MEDICINE  The patient is sedated, intubated and unable to provide history, which was obtained for available medical records.    HPI:  66 yo with recent CVA, vascular dementia and HTN on multiple medications brought to [facility] ED after having "shaking spell" and found to by hypertensive 220/128.  Past Medical History  Diagnosis Date  . Hypertension   . Diabetes mellitus   . CVA (cerebral vascular accident)   . Stroke    History reviewed. No pertinent past surgical history. Prior to Admission medications   Medication Sig Start Date End Date Taking? Authorizing Provider  amLODipine (NORVASC) 10 MG tablet Take 10 mg by mouth daily.   Yes Historical Provider, MD  aspirin 325 MG tablet Take 325 mg by mouth daily.   Yes Historical Provider, MD  cloNIDine (CATAPRES) 0.3 MG tablet Take 0.3 mg by mouth every 8 (eight) hours.   Yes Historical Provider, MD  donepezil (ARICEPT) 10 MG tablet Take 10 mg by mouth daily.   Yes Historical Provider, MD  furosemide (LASIX) 20 MG tablet Take 20 mg by mouth every Monday, Wednesday, and Friday.   Yes Historical Provider, MD  glipiZIDE (GLUCOTROL XL) 10 MG 24 hr tablet Take 10 mg by mouth 2 (two) times daily.   Yes Historical Provider, MD  insulin glargine (LANTUS) 100 UNIT/ML injection Inject 36 Units into the skin at bedtime.   Yes Historical Provider, MD  losartan (COZAAR) 100 MG tablet Take 100 mg by mouth daily.   Yes Historical Provider, MD  metFORMIN (GLUCOPHAGE) 500 MG tablet Take 500 mg by mouth 2 (two) times daily with a meal.   Yes Historical Provider, MD  metoprolol (LOPRESSOR) 100 MG tablet Take 100 mg by mouth 2 (two) times daily.   Yes Historical Provider, MD  pioglitazone (ACTOS) 30 MG tablet Take 30 mg by mouth daily.   Yes Historical Provider, MD  simvastatin (ZOCOR) 20 MG tablet  Take 20 mg by mouth every evening.   Yes Historical Provider, MD  zolpidem (AMBIEN) 5 MG tablet Take 5 mg by mouth at bedtime.   Yes Historical Provider, MD   Allergies No Known Allergies  Family History No family history on file.  Social History  reports that he has never smoked. He does not have any smokeless tobacco history on file. He reports that he does not drink alcohol or use illicit drugs.  Review Of Systems:  Unable to provide  Vital Signs: Temp:  [99 F (37.2 C)-99.5 F (37.5 C)] 99.5 F (37.5 C) (10/08 1930) Pulse Rate:  [75-105] 105  (10/08 1930) Resp:  [16-27] 19  (10/08 1930) BP: (131-220)/(100-133) 213/120 mmHg (10/08 1930) SpO2:  [97 %-100 %] 98 % (10/08 1930) Weight:  [93.8 kg (206 lb 12.7 oz)] 93.8 kg (206 lb 12.7 oz) (10/08 1930)  Physical Examination: General: No acute distress Neuro:  A x O to self, left sided hemiparesis (baseline) HEENT:  PERRL Neck:  No JVD Cardiovascular:  RRR, no m/r/g Lungs:  Bilateral air entry, no w/r/r Abdomen:  Soft, nontender, nondistended, bowel sounds present Musculoskeletal:  Moves allextremities Skin:  No rash  Labs:  Lab 01/23/12 1933 01/23/12 1056 01/23/12 1008  HGB 11.9* -- 12.6*  WBC 8.4 -- 8.8  PLT 162 -- 172  NA -- -- 141  K -- -- 4.0  CL -- --  107  CO2 -- -- 23  GLUCOSE -- -- 211*  BUN -- -- 21  CREATININE 1.37* -- 1.21  CALCIUM -- -- 9.1  MG -- -- --  PHOS -- -- --  AST -- -- 15  ALT -- -- 11  ALKPHOS -- -- 48  BILITOT -- -- 0.4  PROT -- -- 7.3  ALBUMIN -- -- 3.6  INR -- 1.08 --  APTT -- 37 --  INR -- 1.08 --  LATICACIDVEN -- -- --  TROPONINI <0.30 -- --  PHART -- -- --  PCO2ART -- -- --  PO2ART -- -- --  HCO3 -- -- --  O2SAT -- -- --   Imaging: Ct Head Wo Contrast  01/23/2012  *RADIOLOGY REPORT*  Clinical Data: Tremors with urinary incontinence.  CT HEAD WITHOUT CONTRAST  Technique:  Contiguous axial images were obtained from the base of the skull through the vertex without contrast.   Comparison: None.  Findings: There is no evidence for acute infarction, intracranial hemorrhage, mass lesion, hydrocephalus, or extra-axial fluid. There is mild to moderate premature atrophy with chronic microvascular ischemic change.  There is a moderate sized remote left hemisphere infarct affecting the left basal ganglia, external capsule, insular cortex, parietal cortex, temporal cortex, and associated subcortical white matter.  Compensatory enlargement left lateral ventricle has resulted. There is a smaller area of chronic infarction affecting the right basal ganglia.  The calvarium is intact.  There is advanced vascular calcification. There are no CT signs of proximal vascular thrombosis.  Paranasal sinuses and mastoids are unremarkable.  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 Smaller right basal ganglia infarct.  Atrophy with chronic microvascular ischemic change.  No visible acute stroke or intracranial hemorrhage.   Original Report Authenticated By: Elsie Stain, M.D.    Dg Chest Portable 1 View  01/23/2012  *RADIOLOGY REPORT*  Clinical Data: Hypertension.  Diabetic.  Nonsmoker.  PORTABLE CHEST - 1 VIEW  Comparison: None.  Findings: Cardiomegaly.  Scoliosis with mildly tortuous aorta.  Central pulmonary vascular prominence.  No infiltrate, congestive heart failure or pneumothorax.  The patient would eventually benefit from follow-up two-view chest with cardiac leads removed.  IMPRESSION: Cardiomegaly.  Scoliosis with mildly tortuous aorta.  Central pulmonary vascular prominence.  No infiltrate, congestive heart failure or pneumothorax.  The patient would eventually benefit from follow-up two-view chest with cardiac leads removed.   Original Report Authenticated By: Fuller Canada, M.D.    ASSESSMENT AND PLAN  Hypertension Hypertensive urgency (no evidence of end-organ damage) History of CVA with R hemiparesis and vascular dementia - at baseline, no evidence of  acute CVA Diabetes Renal insufficiency  -->  No indications for ICU admission, appropriate for SDU. -->  Agree with restarting home HTN regimen, with an exception of Metoprolol (poor antihypertensive agent).  Would use Labetalol PO instead. -->  Agree with Labetalol / Hydralazine prn.  Would aim for 25% in BP reduction in the first 24 hours (goal  SBP < 170, DBP < 90). -->  Would hold Lasix as bump in Cr. -->  PCCM will sign off, please reconsult if new issues.  Lonia Farber, M.D. Pulmonary and Critical Care Medicine [facility] Pager: [phone]  01/23/2012, 8:57 PM

## 2012-01-23 NOTE — ED Notes (Signed)
Pt eating applesauce, able to feed himself.

## 2012-01-23 NOTE — ED Notes (Signed)
Family friend Tyler Aas [phone]

## 2012-01-23 NOTE — Progress Notes (Signed)
[facility] History and Physical  Robert Mcclure:[id number] DOB: [birth year] DOA: 01/23/2012  Referring physician: Dr. Ignacia Palma PCP: No primary provider on file.    Chief Complaint: "Shaking" spell, and per ED markedly elevated blood pressures  HPI: Robert Mcclure is a 66 y.o. 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 The history is obtained from EDP and records. It is reported that patient's nephew came home this a.m. and found him to be "shaking" and so brought him to the ED. He has no prior history of seizures, no loss of consciousness reported, or incontinence reported. He denies cough, chest pain, change in weakness, headaches, fevers, diarrhea, melena and no hematochezia. He was seen in the ED and his blood pressures were noted to be markedly elevated -up to 220/128. CT scan of his head was done and showed no acute findings, and chest x-ray showed no acute infiltrates. The patient received a total of 60 of IV labetalol but his blood pressures remained markedly elevated in the 180s/low 100s. Labetalol drip was ordered per EDP and a stepdown bed requested for admission to the hospitalist service, but nursing staff indicated that labetalol drips to not go to step down unit. EDP discussed patient with CCM on the recommended giving patient IV hydralazine stating the patient did not require a continuous antihypertensive drip. This was done but his blood pressures remained unchanged. No seizures reported in the ED. He is admitted to hospitalist service for further valuation and management.  Review of Systems: The patient denies anorexia, fever, weight loss,, vision loss, decreased hearing, hoarseness, chest pain, syncope, dyspnea on exertion, peripheral edema, hemoptysis, abdominal pain, melena, hematochezia, severe ,  suspicious skin lesions,  transient blindness  Past Medical History  Diagnosis Date  . Hypertension   . Diabetes mellitus   . CVA (cerebral vascular accident)   . Stroke    History reviewed. No pertinent past surgical history. Social History:  reports that he has never smoked. He does not have any smokeless tobacco history on file. He reports that he does not drink alcohol or use illicit drugs.  where does patient live--home   No Known Allergies  No family history on file.   Prior to Admission medications   Medication Sig Start Date End Date Taking? Authorizing Provider  amLODipine (NORVASC) 10 MG tablet Take 10 mg by mouth daily.   Yes Historical Provider, MD  aspirin 325 MG tablet Take 325 mg by mouth daily.   Yes Historical Provider, MD  cloNIDine (CATAPRES) 0.3 MG tablet Take 0.3 mg by mouth every 8 (eight) hours.   Yes Historical Provider, MD  donepezil (ARICEPT) 10 MG tablet Take 10 mg by mouth daily.   Yes Historical Provider, MD  furosemide (LASIX) 20 MG tablet Take 20 mg by mouth every Monday, Wednesday, and Friday.   Yes Historical Provider, MD  glipiZIDE (GLUCOTROL XL) 10 MG 24 hr tablet Take 10 mg by mouth 2 (two) times daily.   Yes Historical Provider, MD  insulin glargine (LANTUS) 100 UNIT/ML injection Inject 36 Units into the skin at bedtime.   Yes Historical Provider, MD  losartan (COZAAR) 100 MG tablet Take 100 mg by mouth daily.   Yes Historical Provider, MD  metFORMIN (GLUCOPHAGE) 500 MG tablet Take 500 mg by mouth 2 (two) times daily with a meal.   Yes Historical Provider, MD  metoprolol (LOPRESSOR) 100 MG  tablet Take 100 mg by mouth 2 (two) times daily.   Yes Historical Provider, MD  pioglitazone (ACTOS) 30 MG tablet Take 30 mg by mouth daily.   Yes Historical Provider, MD  simvastatin (ZOCOR) 20 MG tablet Take 20 mg by mouth every evening.   Yes Historical Provider, MD  zolpidem (AMBIEN) 5 MG tablet Take 5 mg by mouth at bedtime.   Yes Historical Provider, MD   Physical Exam: Filed  Vitals:   01/23/12 1600 01/23/12 1624 01/23/12 1630 01/23/12 1646  BP: 185/102 185/102 185/103 184/100  Pulse: 92  95 102  Temp:      TempSrc:      Resp: 22  24 26   SpO2: 97%  98% 97%     Constitutional: Vital signs reviewed.  Patient is a well-developed and well-nourished in no acute distress and cooperative with exam. Alert and oriented x1.  Head: Normocephalic and atraumatic Mouth: no erythema or exudates, MMM Eyes: PERRL, EOMI, conjunctivae normal, No scleral icterus.  Neck: Supple, Trachea midline normal ROM, No JVD, mass, thyromegaly, or carotid bruit present.  Cardiovascular: RRR, S1 normal, S2 normal, no MRG, pulses symmetric and intact bilaterally Pulmonary/Chest: CTAB, no wheezes, rales, or rhonchi Abdominal: Soft. Non-tender, non-distended, bowel sounds are normal, no masses, organomegaly, or guarding present.  Extremities: No cyanosis and no edema  Neurological: A&O x1, right-sided hemiparesis, spastic tone, left upper and lower extremity about 4/5 strength Skin: Warm, dry and intact. No rash.  Psychiatric: Normal mood and affect.   Labs on Admission:  Basic Metabolic Panel:  Lab 01/23/12 1610  NA 141  K 4.0  CL 107  CO2 23  GLUCOSE 211*  BUN 21  CREATININE 1.21  CALCIUM 9.1  MG --  PHOS --   Liver Function Tests:  Lab 01/23/12 1008  AST 15  ALT 11  ALKPHOS 48  BILITOT 0.4  PROT 7.3  ALBUMIN 3.6   No results found for this basename: LIPASE:5,AMYLASE:5 in the last 168 hours No results found for this basename: AMMONIA:5 in the last 168 hours CBC:  Lab 01/23/12 1008  WBC 8.8  NEUTROABS 7.3  HGB 12.6*  HCT 37.3*  MCV 92.6  PLT 172   Cardiac Enzymes: No results found for this basename: CKTOTAL:5,CKMB:5,CKMBINDEX:5,TROPONINI:5 in the last 168 hours  BNP (last 3 results) No results found for this basename: PROBNP:3 in the last 8760 hours CBG: No results found for this basename: GLUCAP:5 in the last 168 hours  Radiological Exams on  Admission: Ct Head Wo Contrast  01/23/2012  *RADIOLOGY REPORT*  Clinical Data: Tremors with urinary incontinence.  CT HEAD WITHOUT CONTRAST  Technique:  Contiguous axial images were obtained from the base of the skull through the vertex without contrast.  Comparison: None.  Findings: There is no evidence for acute infarction, intracranial hemorrhage, mass lesion, hydrocephalus, or extra-axial fluid. There is mild to moderate premature atrophy with chronic microvascular ischemic change.  There is a moderate sized remote left hemisphere infarct affecting the left basal ganglia, external capsule, insular cortex, parietal cortex, temporal cortex, and associated subcortical white matter.  Compensatory enlargement left lateral ventricle has resulted. There is a smaller area of chronic infarction affecting the right basal ganglia.  The calvarium is intact.  There is advanced vascular calcification. There are no CT signs of proximal vascular thrombosis.  Paranasal sinuses and mastoids are unremarkable.  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 Smaller right basal ganglia infarct.  Atrophy with chronic microvascular ischemic change.  No  visible acute stroke or intracranial hemorrhage.   Original Report Authenticated By: Elsie Stain, M.D.    Dg Chest Portable 1 View  01/23/2012  *RADIOLOGY REPORT*  Clinical Data: Hypertension.  Diabetic.  Nonsmoker.  PORTABLE CHEST - 1 VIEW  Comparison: None.  Findings: Cardiomegaly.  Scoliosis with mildly tortuous aorta.  Central pulmonary vascular prominence.  No infiltrate, congestive heart failure or pneumothorax.  The patient would eventually benefit from follow-up two-view chest with cardiac leads removed.  IMPRESSION: Cardiomegaly.  Scoliosis with mildly tortuous aorta.  Central pulmonary vascular prominence.  No infiltrate, congestive heart failure or pneumothorax.  The patient would eventually benefit from follow-up two-view chest with cardiac  leads removed.   Original Report Authenticated By: Fuller Canada, M.D.     EKG: Independently reviewed. Sinus rhythm rate of 75, with no acute ischemic changes.  Assessment/Plan Active Problems:  HTN (hypertension), malignant -As discussed above, he is on multiple oral meds outpatient-will resume the Norvasc, clonidine, metoprolol and Lorsartan -IV labetalol when necessary -The etiology of the" shaking spell" is unclear,? Hypertensive encephalopathy with seizures-will obtain an EEG and follow -Monitor her closely in step down unit and further recommendations pending evolution of clinical course.  History of CVA (cerebral vascular accident) -Continue outpatient medications, blood pressure control as above. -CT with no acute findings as above.  Diabetes mellitus -Continue Lantus, Actos, SSI and follow.   Code Status: presumed full code Family Communication: no family available Disposition Plan: admit to step down  Time spent: >14mins  Kela Millin [facility] Pager [phone]  If 7PM-7AM, please contact night-coverage [email or web address] Password [email or web address] 01/23/2012, 5:46 PM

## 2012-01-23 NOTE — ED Notes (Signed)
Pt assisted to bedside commode.  Pt very unsteady, confused and irritable.  Sample collected but contaminated with tissues.  Sample was disposed of and will collect as soon as possible

## 2012-01-23 NOTE — ED Provider Notes (Addendum)
History     CSN: [id number]  Arrival date & time 01/23/12  2952   None     Chief Complaint  Patient presents with  . Hypertension    (Consider location/radiation/quality/duration/timing/severity/associated sxs/prior treatment) HPI Comments: Pt is a 66 yo man who has a prior history of stroke, diabetes and hypertension.  His nephew came home from work this morning, and found him shaking, with a very high blood pressure.  EMS was called and he was brought to [facility] ED for evaluation.  Review of prior charts shows he was hospitalized for stroke in February of 2012.  He has significant residual dysphasia and right sided weakness from prior stokes, and is unable to give his review of symptoms or much history.  Patient is a 66 y.o. male presenting with hypertension. The history is provided by the patient, a relative and medical records. No language interpreter was used.  Hypertension This is a chronic problem. The current episode started 1 to 2 hours ago (Pt's nephew found him shaky, with very high blood pressure.). The problem occurs constantly. The problem has not changed since onset.Pertinent negatives include no chest pain, no abdominal pain, no headaches and no shortness of breath. Nothing aggravates the symptoms. Nothing relieves the symptoms. Treatments tried: Brought to [facility] ED via EMS.    Past Medical History  Diagnosis Date  . Hypertension   . Diabetes mellitus   . CVA (cerebral vascular accident)   . Stroke     History reviewed. No pertinent past surgical history.  No family history on file.  History  Substance Use Topics  . Smoking status: Never Smoker   . Smokeless tobacco: Not on file  . Alcohol Use: No      Review of Systems  Unable to perform ROS: Other  Respiratory: Negative for shortness of breath.   Cardiovascular: Negative for chest pain.  Gastrointestinal: Negative for abdominal pain.  Neurological: Negative for headaches.    Allergies    Review of patient's allergies indicates no known allergies.  Home Medications  No current outpatient prescriptions on file.  BP 194/107  Pulse 78  Temp 99.4 F (37.4 C) (Oral)  Resp 23  SpO2 99%  Physical Exam  Nursing note and vitals reviewed. Constitutional:       BP 194/107.  He has an expressive dysphasia which his nephew says is his baseline.  He also has right sided weakness, also apparently at baseline.  HENT:  Head: Normocephalic and atraumatic.  Right Ear: External ear normal.  Left Ear: External ear normal.  Mouth/Throat: Oropharynx is clear and moist.  Eyes: Conjunctivae normal and EOM are normal. Pupils are equal, round, and reactive to light.  Neck: Normal range of motion. Neck supple.       No carotid bruit  Cardiovascular: Normal rate, regular rhythm and normal heart sounds.   Pulmonary/Chest: Effort normal and breath sounds normal.  Abdominal: Soft. Bowel sounds are normal.  Musculoskeletal:       1 + edema in ankles.  Neurological: He is alert.       Has right sided weakness, expressive dysphasia.  Skin: Skin is warm and dry.  Psychiatric: He has a normal mood and affect. His behavior is normal.    ED Course  Procedures (including critical care time)   Labs Reviewed  CBC WITH DIFFERENTIAL  COMPREHENSIVE METABOLIC PANEL   84:13 AM  Date: 01/23/2012  Rate: 75  Rhythm: normal sinus rhythm  QRS Axis: left  Intervals: normal  ST/T Wave abnormalities: nonspecific T wave changes  Conduction Disutrbances:none  Narrative Interpretation: Abnormal EKG  Old EKG Reviewed: changes noted--Q waves noted on tracing of 06/09/2010 have normalized.  10:47 AM Pt seen -->> physical exam performed.  Lab workup ordered.  IV labetalol ordered.  12:13 PM BP back up.  Labetalol 20 mg IV repeated.  Results for orders placed during the hospital encounter of 01/23/12  CBC WITH DIFFERENTIAL      Component Value Range   WBC 8.8  4.0 - 10.5 K/uL   RBC 4.03 (*) 4.22 -  5.81 MIL/uL   Hemoglobin 12.6 (*) 13.0 - 17.0 g/dL   HCT 16.1 (*) 09.6 - 04.5 %   MCV 92.6  78.0 - 100.0 fL   MCH 31.3  26.0 - 34.0 pg   MCHC 33.8  30.0 - 36.0 g/dL   RDW 40.9  81.1 - 91.4 %   Platelets 172  150 - 400 K/uL   Neutrophils Relative 83 (*) 43 - 77 %   Neutro Abs 7.3  1.7 - 7.7 K/uL   Lymphocytes Relative 12  12 - 46 %   Lymphs Abs 1.0  0.7 - 4.0 K/uL   Monocytes Relative 5  3 - 12 %   Monocytes Absolute 0.4  0.1 - 1.0 K/uL   Eosinophils Relative 0  0 - 5 %   Eosinophils Absolute 0.0  0.0 - 0.7 K/uL   Basophils Relative 0  0 - 1 %   Basophils Absolute 0.0  0.0 - 0.1 K/uL  COMPREHENSIVE METABOLIC PANEL      Component Value Range   Sodium 141  135 - 145 mEq/L   Potassium 4.0  3.5 - 5.1 mEq/L   Chloride 107  96 - 112 mEq/L   CO2 23  19 - 32 mEq/L   Glucose, Bld 211 (*) 70 - 99 mg/dL   BUN 21  6 - 23 mg/dL   Creatinine, Ser 7.82  0.50 - 1.35 mg/dL   Calcium 9.1  8.4 - 95.6 mg/dL   Total Protein 7.3  6.0 - 8.3 g/dL   Albumin 3.6  3.5 - 5.2 g/dL   AST 15  0 - 37 U/L   ALT 11  0 - 53 U/L   Alkaline Phosphatase 48  39 - 117 U/L   Total Bilirubin 0.4  0.3 - 1.2 mg/dL   GFR calc non Af Amer 60 (*) >90 mL/min   GFR calc Af Amer 70 (*) >90 mL/min  PROTIME-INR      Component Value Range   Prothrombin Time 13.9  11.6 - 15.2 seconds   INR 1.08  0.00 - 1.49  APTT      Component Value Range   aPTT 37  24 - 37 seconds   Ct Head Wo Contrast  01/23/2012  *RADIOLOGY REPORT*  Clinical Data: Tremors with urinary incontinence.  CT HEAD WITHOUT CONTRAST  Technique:  Contiguous axial images were obtained from the base of the skull through the vertex without contrast.  Comparison: None.  Findings: There is no evidence for acute infarction, intracranial hemorrhage, mass lesion, hydrocephalus, or extra-axial fluid. There is mild to moderate premature atrophy with chronic microvascular ischemic change.  There is a moderate sized remote left hemisphere infarct affecting the left basal  ganglia, external capsule, insular cortex, parietal cortex, temporal cortex, and associated subcortical white matter.  Compensatory enlargement left lateral ventricle has resulted. There is a smaller area of chronic infarction affecting the right basal ganglia.  The  calvarium is intact.  There is advanced vascular calcification. There are no CT signs of proximal vascular thrombosis.  Paranasal sinuses and mastoids are unremarkable.  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 Smaller right basal ganglia infarct.  Atrophy with chronic microvascular ischemic change.  No visible acute stroke or intracranial hemorrhage.   Original Report Authenticated By: Elsie Stain, M.D.    Dg Chest Portable 1 View  01/23/2012  *RADIOLOGY REPORT*  Clinical Data: Hypertension.  Diabetic.  Nonsmoker.  PORTABLE CHEST - 1 VIEW  Comparison: None.  Findings: Cardiomegaly.  Scoliosis with mildly tortuous aorta.  Central pulmonary vascular prominence.  No infiltrate, congestive heart failure or pneumothorax.  The patient would eventually benefit from follow-up two-view chest with cardiac leads removed.  IMPRESSION: Cardiomegaly.  Scoliosis with mildly tortuous aorta.  Central pulmonary vascular prominence.  No infiltrate, congestive heart failure or pneumothorax.  The patient would eventually benefit from follow-up two-view chest with cardiac leads removed.   Original Report Authenticated By: Fuller Canada, M.D.     Lab workup showed no new stroke.  His blood pressure remains high despite two doses of labetalol.  Will ask [facility] to admit him for hypertensive urgency.  3:22 PM Case discussed with Dr. Donna Bernard.  Admit to observation staues to a telemetry bed to [facility] Team 2.    1. Hypertensive urgency     3:56 PM No response to 3 doses of Labetalol, so IV Labetalol continuous infusion ordered.  4:05 PM Flow manager advises that if pt needs Labetalol infusion he will need  to go to ICU.  Will discuss with pulmonary and critical care.   4:14 PM Case discussed with Dr. Craige Cotta of Critical Care.  He advised giving hydralazine 10 mg IV.  He does not think pt will need labetalol continuous infusion, recommends admission to [facility].      Carleene Cooper III, MD 01/23/12 1523    Carleene Cooper III, MD 01/23/12 1601    Carleene Cooper III, MD 01/23/12 2015

## 2012-01-23 NOTE — ED Notes (Signed)
Pt moved to room 29. Pt is alert and eating applesauce at time of moving. Pt denies any pain. Is able to follow simple commands

## 2012-01-23 NOTE — ED Notes (Signed)
Attempted IV access x3 without success; another RN to attempt US guided IV

## 2012-01-24 ENCOUNTER — Observation Stay (HOSPITAL_COMMUNITY): Payer: PRIVATE HEALTH INSURANCE

## 2012-01-24 DIAGNOSIS — I6350 Cerebral infarction due to unspecified occlusion or stenosis of unspecified cerebral artery: Secondary | ICD-10-CM

## 2012-01-24 LAB — GLUCOSE, CAPILLARY
Glucose-Capillary: 121 mg/dL — ABNORMAL HIGH (ref 70–99)
Glucose-Capillary: 123 mg/dL — ABNORMAL HIGH (ref 70–99)
Glucose-Capillary: 131 mg/dL — ABNORMAL HIGH (ref 70–99)

## 2012-01-24 LAB — TROPONIN I
Troponin I: <0.3 ng/mL (ref <0.30)
Troponin I: <0.3 ng/mL (ref <0.30)

## 2012-01-24 MED ORDER — GLIPIZIDE 10 MG PO TABS
10.0000 mg | ORAL_TABLET | Freq: Two times a day (BID) | ORAL | Status: DC
  Administered 2012-01-24: 10 mg via ORAL
  Filled 2012-01-24 (×5): qty 1

## 2012-01-24 NOTE — Progress Notes (Signed)
Patient being transferred to [facility] per MD order. All VSS, pt A&O, patient will transfer in wheel chair.

## 2012-01-24 NOTE — Progress Notes (Signed)
Utilization review completed.  

## 2012-01-24 NOTE — Progress Notes (Signed)
EEG completed bedside

## 2012-01-24 NOTE — Evaluation (Signed)
Occupational Therapy Evaluation Patient Details Name: Robert Mcclure MRN: [id number] DOB: 11-18-1945 Today's Date: 01/24/2012 Time: 4403-4742 OT Time Calculation (min): 30 min  OT Assessment / Plan / Recommendation Clinical Impression  Pt admitted with Possible seizure activity versus hypertensive encephalopathy and high BP thus affecting PLOF. Will benefit from acute OT services to address below problem list in prep for d/c home.    OT Assessment  Patient needs continued OT Services    Follow Up Recommendations  No OT follow up;Home health OT (HHOT vs none pending progress)    Barriers to Discharge Decreased caregiver support Pt alone most of time except for aide who comes in 2-3 hrs Monday-Friday.  Equipment Recommendations  None recommended by PT;None recommended by OT    Recommendations for Other Services    Frequency  Min 2X/week    Precautions / Restrictions     Pertinent Vitals/Pain See vitals    ADL  Eating/Feeding: Performed;Modified independent Where Assessed - Eating/Feeding: Bed level Upper Body Dressing: Performed;Set up Where Assessed - Upper Body Dressing: Unsupported sitting Toilet Transfer: Minimal assistance;Simulated Toilet Transfer Method: Sit to Barista:  (ambulated from bed to recliner) Toileting - Clothing Manipulation and Hygiene: Performed;Minimal assistance Where Assessed - Engineer, mining and Hygiene: Standing Equipment Used: Gait belt;Rolling walker Transfers/Ambulation Related to ADLs: Min assist with RW and VC for safety with maneuvering RW. ADL Comments: Upon OT arrival, pt's condom cath had come off and bed linens wet.  Pt able to stand and perform front/back peri care with min assist for thoroughness. 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    OT Diagnosis: Generalized weakness  OT Problem List: Decreased strength;Impaired  balance (sitting and/or standing) OT Treatment Interventions: Self-care/ADL training;DME and/or AE instruction;Therapeutic activities;Patient/family education;Balance training   OT Goals Acute Rehab OT Goals OT Goal Formulation: With patient Time For Goal Achievement: 01/31/12 Potential to Achieve Goals: Good ADL Goals Pt Will Perform Grooming: with modified independence;Standing at sink ADL Goal: Grooming - Progress: Goal set today Pt Will Perform Upper Body Dressing: with modified independence;Sitting, chair;Sitting, bed;Unsupported ADL Goal: Upper Body Dressing - Progress: Goal set today Pt Will Perform Lower Body Dressing: with modified independence;Sit to stand from bed;Sit to stand from chair;Unsupported ADL Goal: Lower Body Dressing - Progress: Goal set today Pt Will Transfer to Toilet: with modified independence;Ambulation;with DME;Regular height toilet ADL Goal: Toilet Transfer - Progress: Goal set today Pt Will Perform Toileting - Clothing Manipulation: with modified independence;Sitting on 3-in-1 or toilet;Standing ADL Goal: Toileting - Clothing Manipulation - Progress: Goal set today Pt Will Perform Toileting - Hygiene: with modified independence;Sit to stand from 3-in-1/toilet;Standing at 3-in-1/toilet ADL Goal: Toileting - Hygiene - Progress: Goal set today  Visit Information  Last OT Received On: 01/24/12 Assistance Needed: +1    Subjective Data      Prior Functioning     Home Living Lives With: Alone Available Help at Discharge: Personal care attendant;Family;Friend(s) (5 days/week, 2-3 hrs; family/friends on w/e & some evenings) Type of Home: Apartment Home Access: Elevator Home Layout: One level Bathroom Shower/Tub: Engineer, manufacturing systems: Standard Bathroom Accessibility: Yes How Accessible: Accessible via walker Home Adaptive Equipment: Walker - rolling;Straight cane;Other (comment) (some type of shower seat--unclear) Prior Function Level of  Independence: Needs assistance Needs Assistance: Bathing;Meal Prep;Light Housekeeping Communication Communication: Expressive difficulties Dominant Hand: Right         Vision/Perception  Cognition  Overall Cognitive Status: Difficult to assess Difficult to assess due to: Impaired communication Arousal/Alertness: Awake/alert Orientation Level: Appears intact for tasks assessed Behavior During Session: [facility] for tasks performed Cognition - Other Comments: pt not able to state place or situation, however when given choices, he could choose the correct answer    Extremity/Trunk Assessment Right Upper Extremity Assessment RUE ROM/Strength/Tone: Deficits;WFL for tasks assessed RUE ROM/Strength/Tone Deficits: h/o CVA .  3+/5 throughout. RUE Sensation: Deficits RUE Sensation Deficits: decreased sensation to light touch (baseline) RUE Coordination: WFL - gross/fine motor Left Upper Extremity Assessment LUE ROM/Strength/Tone: Within functional levels LUE Sensation: WFL - Light Touch;WFL - Proprioception LUE Coordination: WFL - gross/fine motor Right Lower Extremity Assessment RLE ROM/Strength/Tone: Deficits RLE ROM/Strength/Tone Deficits: h/o CVA and pt indicates degree of weakness is his baseline; grossly 3+ to 4/5 RLE Sensation: Deficits RLE Sensation Deficits: decr light touch Left Lower Extremity Assessment LLE ROM/Strength/Tone: WFL for tasks assessed LLE Sensation: WFL - Light Touch     Mobility Bed Mobility Bed Mobility: Supine to Sit;Sitting - Scoot to Edge of Bed Supine to Sit: 5: Supervision;With rails;HOB elevated Sitting - Scoot to Edge of Bed: 5: Supervision;With rail Details for Bed Mobility Assistance: significantly incr time and vc to continue working his way to get his feet on the floor Transfers Sit to Stand: 5: Supervision;With upper extremity assist;With armrests;From bed;From chair/3-in-1 Stand to Sit: 4: Min guard;With upper extremity assist;With  armrests;To chair/3-in-1 Details for Transfer Assistance: pt appropriately pushed off surface to come to stand; required cues for releasing RW and reaching back to surface     Shoulder Instructions     Exercise     Balance Balance Balance Assessed: Yes Static Sitting Balance Static Sitting - Balance Support: No upper extremity supported;Feet unsupported Static Sitting - Level of Assistance: 7: Independent Static Standing Balance Static Standing - Balance Support: No upper extremity supported Static Standing - Level of Assistance: 5: Stand by assistance Dynamic Standing Balance Dynamic Standing - Balance Support: No upper extremity supported Dynamic Standing - Level of Assistance: 5: Stand by assistance   End of Session OT - End of Session Equipment Utilized During Treatment: Gait belt Activity Tolerance: Patient tolerated treatment well Patient left: in chair;with call bell/phone within reach Nurse Communication: Mobility status  GO    01/24/2012 Cipriano Mile OTR/L Pager [phone] Office [phone]  Cipriano Mile 01/24/2012, 5:14 PM

## 2012-01-24 NOTE — Procedures (Signed)
EEG NUMBER:  REFERRING PHYSICIAN:  Kela Millin, M.D.  HISTORY:  A 66 year old male with episode of shaking at home.  MEDICATIONS:  Norvasc, aspirin, clonidine, Aricept, Lasix, Glucotrol, Lantus, Cozaar, Glucophage, Lopressor, Actos, Zocor, Ambien.  CONDITIONS OF RECORDING:  This is a 16-channel EEG carried out with the patient in the drowsy and asleep state.  DESCRIPTION:  The patient does not achieve full wakefulness throughout the tracing.  During drowse, the background activity is slow and poorly organized with the predominance of theta activity noted.  Also noted during drowsiness of further slowing over the left hemisphere that is intermittent and consists of underlying polymorphic delta rhythm.  The patient goes into a light sleep with symmetrical sleep spindles.  The vertex was a sharp activity and irregular slow activity.  It persist intermittently during sleep as well, some focal slowing over the left hemisphere.  Also, noted intermittently over the left hemisphere with sharp transient with phase reversal at T3.  Hyperventilation was not performed.  Intermittent photic stimulation failed to elicit any change in the tracing.  IMPRESSION:  This is an abnormal EEG secondary to focal slowing with sharp transients over the left hemisphere with phase reversal at T3. This finding is suggestive of a focal disturbance that is etiologically nonspecific, but possesses epileptogenic potential.          ______________________________ Thana Farr, MD    [id number] D:  01/24/2012 17:50:43  T:  01/24/2012 18:48:00  Job #:  [id number]

## 2012-01-24 NOTE — Progress Notes (Signed)
[facility] Progress Note [place] TEAM 1 - Stepdown/ICU TEAM   Robert Mcclure ZOX:[id number] DOB: Nov 06, 1945 DOA: 01/23/2012 PCP: No primary provider on file.  Brief narrative: 66 y.o. 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 The patient's nephew came home and found him to be "shaking" and so brought him to the ED. He has no prior history of seizures, no loss of consciousness reported, or incontinence reported. He was seen in the ED and his blood pressures were noted to be markedly elevated -up to 220/128. CT scan of his head was done and showed no acute findings, and chest x-ray showed no acute infiltrates. The patient received a total of 60 of IV labetalol but his blood pressures remained markedly elevated in the 180s/low 100s.   Assessment/Plan:  Malignant hypertension/hypertensive urgency Blood pressure now very well controlled compared to admission - no change in treatment regimen today - continue to follow trend   Possible seizure activity versus hypertensive encephalopathy Exact etiology unclear - awaiting results of the EEG - CT scan of the head without acute findings - with mental status now normalized and with nonfocal exam MRI is likely a low yield test  Diabetes mellitus Reasonably controlled at the present time - follow  History of CVA 2012 - residual right sided weakness and dysphagia No focal neurologic deficits beyond the patient's baseline  Renal insufficiency Creatinine has slightly risen - recheck renal function in a.m.  Normocytic anemia Check anemia panel - guaiac stool  Code Status: Full code Disposition Plan: Transfer to neurology floor  Consultants: None  Procedures: None  Antibiotics: None  DVT prophylaxis: Lovenox  HPI/Subjective: The patient is sitting up in a chair at the bedside.  He denies  headache chest pain shortness of breath nausea vomiting or abdominal pain.  He is alert and oriented.   Objective: Blood pressure 138/94, pulse 80, temperature 97.5 F (36.4 C), temperature source Oral, resp. rate 18, height 5\' 11"  (1.803 m), weight 93.8 kg (206 lb 12.7 oz), SpO2 98.00%.  Intake/Output Summary (Last 24 hours) at 01/24/12 1338 Last data filed at 01/24/12 1202  Gross per 24 hour  Intake    183 ml  Output    150 ml  Net     33 ml     Exam: General: No acute respiratory distress Lungs: Clear to auscultation bilaterally without wheezes or crackles Cardiovascular: Regular rate and rhythm without murmur gallop or rub  Abdomen: Nontender, nondistended, soft, bowel sounds positive, no rebound, no ascites, no appreciable mass Extremities: No significant cyanosis, clubbing, or edema bilateral lower extremities Neuro:  Alert, oriented, 4/5 strength in right upper extremity with 5/5 strength in left upper extremity, cranial nerves intact bilaterally to gross exam  Data Reviewed: Basic Metabolic Panel:  Lab 01/23/12 8119 01/23/12 1008  NA -- 141  K -- 4.0  CL -- 107  CO2 -- 23  GLUCOSE -- 211*  BUN -- 21  CREATININE 1.37* 1.21  CALCIUM -- 9.1  MG -- --  PHOS -- --   Liver Function Tests:  Lab 01/23/12 1008  AST 15  ALT 11  ALKPHOS 48  BILITOT 0.4  PROT 7.3  ALBUMIN 3.6   CBC:  Lab 01/23/12 1933 01/23/12 1008  WBC 8.4 8.8  NEUTROABS -- 7.3  HGB 11.9* 12.6*  HCT 35.6* 37.3*  MCV 91.3 92.6  PLT 162 172  Cardiac Enzymes:  Lab 01/24/12 0720 01/24/12 0125 01/23/12 1933  CKTOTAL -- -- --  CKMB -- -- --  CKMBINDEX -- -- --  TROPONINI <0.30 <0.30 <0.30   CBG:  Lab 01/24/12 1201 01/24/12 0931 01/24/12 0835 01/23/12 2141  GLUCAP 123* 121* 70 217*    Recent Results (from the past 240 hour(s))  MRSA PCR SCREENING     Status: Normal   Collection Time   01/23/12  7:21 PM      Component Value Range Status Comment   MRSA by PCR NEGATIVE  NEGATIVE Final       Studies:  Recent x-ray studies have been reviewed in detail by the Attending Physician  Scheduled Meds:  Reviewed in detail by the Attending Physician   Lonia Blood, MD [facility] Office  [phone] Pager [phone]  On-Call/Text Page:      [email or web address]      password [email or web address]  If 7PM-7AM, please contact night-coverage [email or web address] Password [email or web address] 01/24/2012, 1:38 PM   LOS: 1 day

## 2012-01-24 NOTE — Evaluation (Signed)
Physical Therapy Evaluation Patient Details Name: Robert Mcclure MRN: [id number] DOB: 1945/08/12 Today's Date: 01/24/2012 Time: 5366-4403 PT Time Calculation (min): 30 min  PT Assessment / Plan / Recommendation Clinical Impression  66 yo adm with "shaking episode" and high BP. Pt appears to be at or near his baseline, although there is no family present to confirm this.  Anticipate he will be able to return home with continued support of [facility] aide and family/friends.    PT Assessment  Patient needs continued PT services    Follow Up Recommendations  Home health PT;Supervision - Intermittent    Does the patient have the potential to tolerate intense rehabilitation      Barriers to Discharge Decreased caregiver support      Equipment Recommendations  None recommended by PT    Recommendations for Other Services     Frequency Min 3X/week    Precautions / Restrictions     Pertinent Vitals/Pain BP 138/101 supine       150/101 sitting        170/99  Sit after walking              Mobility  Bed Mobility Bed Mobility: Supine to Sit;Sitting - Scoot to Edge of Bed Supine to Sit: 5: Supervision;With rails;HOB elevated Sitting - Scoot to Edge of Bed: 5: Supervision;With rail Details for Bed Mobility Assistance: significantly incr time and vc to continue working his way to get his feet on the floor Transfers Transfers: Sit to Stand;Stand to Sit Sit to Stand: 5: Supervision;With upper extremity assist;With armrests;From bed;From chair/3-in-1 Stand to Sit: 4: Min guard;With upper extremity assist;With armrests;To chair/3-in-1 Details for Transfer Assistance: pt appropriately pushed off surface to come to stand; required cues for releasing RW and reaching back to surface Ambulation/Gait Ambulation/Gait Assistance: 4: Min assist Ambulation Distance (Feet): 12 Feet Assistive device: Rolling walker Ambulation/Gait Assistance Details: pt required assist x 1 to maneuver RW around an  obstacle on his left; otherwise managed to steer RW well with weak RUE Gait Pattern: Step-through pattern;Decreased stride length;Wide base of support;Decreased hip/knee flexion - right;Decreased hip/knee flexion - left Gait velocity: decr Stairs: No    Shoulder Instructions     Exercises     PT Diagnosis: Hemiplegia dominant side;Generalized weakness  PT Problem List: Decreased strength;Decreased balance;Decreased mobility;Decreased knowledge of use of DME;Impaired sensation PT Treatment Interventions: DME instruction;Gait training;Functional mobility training;Therapeutic activities;Therapeutic exercise;Balance training;Neuromuscular re-education;Patient/family education   PT Goals Acute Rehab PT Goals PT Goal Formulation: With patient Time For Goal Achievement: 01/31/12 Potential to Achieve Goals: Good Pt will go Supine/Side to Sit: with modified independence;with HOB 0 degrees PT Goal: Supine/Side to Sit - Progress: Goal set today Pt will go Sit to Supine/Side: with modified independence;with HOB 0 degrees PT Goal: Sit to Supine/Side - Progress: Goal set today Pt will go Sit to Stand: with modified independence;with upper extremity assist PT Goal: Sit to Stand - Progress: Goal set today Pt will go Stand to Sit: with modified independence;with upper extremity assist PT Goal: Stand to Sit - Progress: Goal set today Pt will Ambulate: 51 - 150 feet;with modified independence;with least restrictive assistive device PT Goal: Ambulate - Progress: Goal set today  Visit Information  Last PT Received On: 01/24/12 Assistance Needed: +1    Subjective Data  Subjective: Pt provided all history; slow, stuttering speech due to expressive aphasia Patient Stated Goal: wants to return to his apartment   Prior Functioning  Home Living Lives With: Alone Available Help at  Discharge: Personal care attendant;Family;Friend(s) (5 days/week, 2-3 hrs; family/friends on w/e & some evenings) Type of  Home: Apartment Home Access: Elevator Home Layout: One level Bathroom Shower/Tub: Engineer, manufacturing systems: Standard Bathroom Accessibility: Yes How Accessible: Accessible via walker Home Adaptive Equipment: Walker - rolling;Straight cane;Other (comment) (some type of shower seat--unclear) Prior Function Level of Independence: Needs assistance Needs Assistance: Bathing;Meal Prep;Light Housekeeping Communication Communication: Expressive difficulties Dominant Hand: Right    Cognition  Overall Cognitive Status: Difficult to assess Difficult to assess due to: Impaired communication Arousal/Alertness: Awake/alert Orientation Level: Appears intact for tasks assessed Behavior During Session: [facility] for tasks performed Cognition - Other Comments: pt not able to state place or situation, however when given choices, he could choose the correct answer    Extremity/Trunk Assessment Right Lower Extremity Assessment RLE ROM/Strength/Tone: Deficits RLE ROM/Strength/Tone Deficits: h/o CVA and pt indicates degree of weakness is his baseline; grossly 3+ to 4/5 RLE Sensation: Deficits RLE Sensation Deficits: decr light touch Left Lower Extremity Assessment LLE ROM/Strength/Tone: WFL for tasks assessed LLE Sensation: WFL - Light Touch   Balance Balance Balance Assessed: Yes Static Sitting Balance Static Sitting - Balance Support: No upper extremity supported;Feet unsupported Static Sitting - Level of Assistance: 7: Independent Static Standing Balance Static Standing - Balance Support: No upper extremity supported Static Standing - Level of Assistance: 5: Stand by assistance Dynamic Standing Balance Dynamic Standing - Balance Support: No upper extremity supported Dynamic Standing - Level of Assistance: 5: Stand by assistance  End of Session PT - End of Session Equipment Utilized During Treatment: Gait belt Activity Tolerance: Patient tolerated treatment well Patient left: in chair;with  call bell/phone within reach Nurse Communication: Mobility status;Other (comment) (condom cath off on arrival with bed linens wet)  GP Functional Assessment Tool Used: clinical judgement Functional Limitation: Mobility: Walking and moving around Mobility: Walking and Moving Around Current Status ([id number]): At least 1 percent but less than 20 percent impaired, limited or restricted Mobility: Walking and Moving Around Goal Status [phone]): At least 1 percent but less than 20 percent impaired, limited or restricted   Shantese Raven 01/24/2012, 2:16 PM  Pager [phone]

## 2012-01-25 DIAGNOSIS — R569 Unspecified convulsions: Secondary | ICD-10-CM

## 2012-01-25 LAB — CBC
Hemoglobin: 10.7 g/dL — ABNORMAL LOW (ref 13.0–17.0)
MCH: 31.7 pg (ref 26.0–34.0)
MCV: 93.5 fL (ref 78.0–100.0)
Platelets: 127 10*3/uL — ABNORMAL LOW (ref 150–400)
RBC: 3.38 MIL/uL — ABNORMAL LOW (ref 4.22–5.81)
WBC: 5 10*3/uL (ref 4.0–10.5)

## 2012-01-25 LAB — BASIC METABOLIC PANEL
CO2: 24 mEq/L (ref 19–32)
Chloride: 109 mEq/L (ref 96–112)
Glucose, Bld: 97 mg/dL (ref 70–99)
Potassium: 3.4 mEq/L — ABNORMAL LOW (ref 3.5–5.1)
Sodium: 143 mEq/L (ref 135–145)

## 2012-01-25 LAB — IRON AND TIBC
Iron: 65 ug/dL (ref 42–135)
TIBC: 219 ug/dL (ref 215–435)

## 2012-01-25 LAB — VITAMIN B12: Vitamin B-12: 374 pg/mL (ref 211–911)

## 2012-01-25 LAB — GLUCOSE, CAPILLARY
Glucose-Capillary: 81 mg/dL (ref 70–99)
Glucose-Capillary: 198 mg/dL — ABNORMAL HIGH (ref 70–99)

## 2012-01-25 LAB — FOLATE: Folate: 19.1 ng/mL

## 2012-01-25 LAB — RETICULOCYTES: Retic Count, Absolute: 40.6 10*3/uL (ref 19.0–186.0)

## 2012-01-25 MED ORDER — GLIPIZIDE 10 MG PO TABS
10.0000 mg | ORAL_TABLET | Freq: Every day | ORAL | Status: DC
  Administered 2012-01-25: 10 mg via ORAL
  Filled 2012-01-25 (×3): qty 1

## 2012-01-25 MED ORDER — POTASSIUM CHLORIDE CRYS ER 10 MEQ PO TBCR
10.0000 meq | EXTENDED_RELEASE_TABLET | Freq: Once | ORAL | Status: AC
  Administered 2012-01-25: 10 meq via ORAL
  Filled 2012-01-25 (×3): qty 1

## 2012-01-25 MED ORDER — SIMVASTATIN 40 MG PO TABS: 40.0000 mg | ORAL_TABLET | Freq: Every evening | ORAL | Status: DC

## 2012-01-25 MED ORDER — LEVETIRACETAM 500 MG PO TABS
500.0000 mg | ORAL_TABLET | Freq: Two times a day (BID) | ORAL | Status: DC
  Administered 2012-01-25 – 2012-01-27 (×5): 500 mg via ORAL
  Filled 2012-01-25 (×6): qty 1

## 2012-01-25 MED ORDER — ATORVASTATIN CALCIUM 20 MG PO TABS
20.0000 mg | ORAL_TABLET | Freq: Every day | ORAL | Status: DC
  Administered 2012-01-25 – 2012-01-26 (×2): 20 mg via ORAL
  Filled 2012-01-25 (×3): qty 1

## 2012-01-25 NOTE — Progress Notes (Signed)
Physical Therapy Treatment Patient Details Name: RAFAEL SALWAY MRN: [id number] DOB: 03/23/1946 Today's Date: 01/25/2012 Time: 0842-0906 PT Time Calculation (min): 24 min  PT Assessment / Plan / Recommendation Comments on Treatment Session  Patient making progress towards goal and willing to participate in therapy. Patient able to increase ambulation today. Noted some increased swelling of R LE. RN made aware.     Follow Up Recommendations  Home health PT;Supervision - Intermittent     Does the patient have the potential to tolerate intense rehabilitation     Barriers to Discharge        Equipment Recommendations  None recommended by OT;None recommended by PT    Recommendations for Other Services    Frequency Min 3X/week   Plan Discharge plan remains appropriate;Frequency remains appropriate    Precautions / Restrictions Restrictions Weight Bearing Restrictions: No   Pertinent Vitals/Pain     Mobility  Bed Mobility Supine to Sit: 5: Supervision;With rails Sitting - Scoot to Edge of Bed: 5: Supervision Details for Bed Mobility Assistance: Patient continues to require VCs to place feet on the floor Transfers Sit to Stand: 5: Supervision;With upper extremity assist;From chair/3-in-1 Stand to Sit: 5: Supervision;With upper extremity assist;To chair/3-in-1 Details for Transfer Assistance: Cues for safe hand placement with stand and for positioning prior to sitting.  Ambulation/Gait Ambulation/Gait Assistance: 4: Min assist Ambulation Distance (Feet): 150 Feet Assistive device: Rolling walker Ambulation/Gait Assistance Details: Patient able to maneuver RW pretty well this session. Patient with one R knee buckle but no LOB.  Gait Pattern: Step-through pattern;Decreased stride length;Wide base of support Gait velocity: decreased    Exercises General Exercises - Lower Extremity Ankle Circles/Pumps: AROM;Right;15 reps Long Arc Quad: AAROM;Right;15 reps Heel Slides:  AAROM;Right;15 reps Straight Leg Raises: AAROM;Right;15 reps Hip Flexion/Marching: AAROM;Right;15 reps   PT Diagnosis:    PT Problem List:   PT Treatment Interventions:     PT Goals Acute Rehab PT Goals PT Goal: Supine/Side to Sit - Progress: Progressing toward goal PT Goal: Sit to Stand - Progress: Progressing toward goal PT Goal: Stand to Sit - Progress: Progressing toward goal PT Goal: Ambulate - Progress: Progressing toward goal  Visit Information  Last PT Received On: 01/25/12 Assistance Needed: +1    Subjective Data      Cognition  Difficult to assess due to: Impaired communication Arousal/Alertness: Awake/alert Orientation Level: Appears intact for tasks assessed Behavior During Session: [facility] for tasks performed    Balance     End of Session PT - End of Session Equipment Utilized During Treatment: Gait belt Activity Tolerance: Patient tolerated treatment well Patient left: in chair;with call bell/phone within reach Nurse Communication: Mobility status   GP     Fredrich Birks 01/25/2012, 9:11 AM 01/25/2012 Fredrich Birks PTA [phone] pager [phone] office

## 2012-01-25 NOTE — Progress Notes (Signed)
[facility] Progress Note [place] TEAM 1 - Stepdown/ICU TEAM   IVEY CINA ZOX:[id number] DOB: April 22, 1945 DOA: 01/23/2012 PCP: Jeri Cos, MD  Brief narrative: 66 y.o. 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 The patient's nephew came home and found him to be "shaking" and so brought him to the ED. He has no prior history of seizures, no loss of consciousness reported, or incontinence reported. He was seen in the ED and his blood pressures were noted to be markedly elevated -up to 220/128. CT scan of his head was done and showed no acute findings, and chest x-ray showed no acute infiltrates. The patient received a total of 60 of IV labetalol but his blood pressures remained markedly elevated in the 180s/low 100s.   Assessment/Plan:  Malignant hypertension/hypertensive urgency The patient was admitted on 01/23/12 from the ED with severe HTN and shaking - he was already on multiple oral meds at home and they were resumed - immediately - including amlodipine 10 mg daily , clonidine 0.3 TID, cozaar 100 daily and labetalol 200 BID. Stopped cozaar 10/10 due to rising creatinine     Possible seizure activity versus hypertensive encephalopathy Exact etiology unclear - - CT scan of the head without acute findings. EEG from 10/9 showing potential epileptogenic area. Neurology - dr. Thana Farr consulted and she recommended we start patient on keppra 500 BID - shich we did from 10/10   Diabetes mellitus With developing renal failure he is at high risk for hypoglycemia - Glipizide decreased to 10 mg daily 10/10   History of CVA 2012 - residual right sided weakness and dysphagia No focal neurologic deficits beyond the patient's baseline  Acute Renal insufficiency With better BP control patient has developed acute renal failure  By 10/10 -  ? Hypoperfusion. On 10/10 ARB placed on hold    Normocytic anemia Anemia panel pending   Code Status: Full code Disposition Plan: home   Consultants: Neurology   Procedures: EEG    DVT prophylaxis: Lovenox  HPI/Subjective: No headache, no seizures   Objective: Blood pressure 155/82, pulse 78, temperature 98.2 F (36.8 C), temperature source Oral, resp. rate 18, height 5\' 11"  (1.803 m), weight 93.8 kg (206 lb 12.7 oz), SpO2 100.00%. No intake or output data in the 24 hours ending 01/25/12 1549  Patient Vitals for the past 24 hrs:  BP Temp Temp src Pulse Resp SpO2  01/25/12 1000 155/82 mmHg 98.2 F (36.8 C) Oral 78  18  100 %  01/25/12 0629 154/84 mmHg 98.1 F (36.7 C) Oral 73  16  100 %  01/25/12 0209 116/68 mmHg 97.8 F (36.6 C) Oral 69  16  98 %  01/24/12 2150 149/93 mmHg 97.5 F (36.4 C) Oral 70  18  100 %  01/24/12 1805 146/83 mmHg 98.3 F (36.8 C) Oral 68  20  99 %  01/24/12 1556 148/90 mmHg 98.1 F (36.7 C) Oral 84  20  100 %    Exam: General: No acute respiratory distress Lungs: Clear to auscultation bilaterally without wheezes or crackles Cardiovascular: Regular rate and rhythm without murmur gallop or rub  Abdomen: Nontender, nondistended, soft, bowel sounds positive, no rebound, no ascites, no appreciable mass Neuro:  Alert, oriented, 4/5 strength in right upper extremity with 5/5 strength in left upper extremity, cranial nerves intact bilaterally to gross exam  Data Reviewed: Basic Metabolic  Panel:  Lab 01/25/12 0630 01/23/12 1933 01/23/12 1008  NA 143 -- 141  K 3.4* -- 4.0  CL 109 -- 107  CO2 24 -- 23  GLUCOSE 97 -- 211*  BUN 33* -- 21  CREATININE 1.65* 1.37* 1.21  CALCIUM 8.7 -- 9.1  MG -- -- --  PHOS -- -- --   Liver Function Tests:  Lab 01/23/12 1008  AST 15  ALT 11  ALKPHOS 48  BILITOT 0.4  PROT 7.3  ALBUMIN 3.6   CBC:  Lab 01/25/12 0630 01/23/12 1933 01/23/12 1008  WBC 5.0 8.4 8.8  NEUTROABS -- -- 7.3  HGB 10.7* 11.9*  12.6*  HCT 31.6* 35.6* 37.3*  MCV 93.5 91.3 92.6  PLT 127* 162 172   Cardiac Enzymes:  Lab 01/24/12 0720 01/24/12 0125 01/23/12 1933  CKTOTAL -- -- --  CKMB -- -- --  CKMBINDEX -- -- --  TROPONINI <0.30 <0.30 <0.30   CBG:  Lab 01/25/12 1256 01/25/12 0632 01/24/12 2154 01/24/12 1641 01/24/12 1201  GLUCAP 198* 81 174* 131* 123*    Recent Results (from the past 240 hour(s))  MRSA PCR SCREENING     Status: Normal   Collection Time   01/23/12  7:21 PM      Component Value Range Status Comment   MRSA by PCR NEGATIVE  NEGATIVE Final      Studies:  Recent x-ray studies have been reviewed in detail by the Attending Physician  Scheduled Meds:     . amLODipine  10 mg Oral Daily  . aspirin  325 mg Oral Daily  . cloNIDine  0.3 mg Oral Q8H  . donepezil  10 mg Oral Daily  . enoxaparin (LOVENOX) injection  40 mg Subcutaneous Q24H  . glipiZIDE  10 mg Oral QAC breakfast  . insulin aspart  0-15 Units Subcutaneous TID WC  . insulin aspart  0-5 Units Subcutaneous QHS  . insulin glargine  36 Units Subcutaneous QHS  . labetalol  200 mg Oral BID  . levETIRAcetam  500 mg Oral BID  . pioglitazone  30 mg Oral Q breakfast  . potassium chloride  10 mEq Oral Once  . simvastatin  40 mg Oral QPM  . sodium chloride  3 mL Intravenous Q12H  . zolpidem  5 mg Oral QHS  . DISCONTD: glipiZIDE  10 mg Oral BID AC  . DISCONTD: losartan  100 mg Oral Daily  . DISCONTD: simvastatin  20 mg Oral QPM      Eliot Bencivenga, MD [phone] If 7PM-7AM, please contact night-coverage [email or web address] Password [email or web address] 01/25/2012, 3:49 PM   LOS: 2 days

## 2012-01-25 NOTE — Consult Note (Signed)
[facility] CONSULT [facility]     Reason for Consult: shaking spells    HPI:    Robert Mcclure is an 66 y.o. male who was brought to the hospital after noted shaking spells.  Per notes, patient was found by nephew "shaking".  After admission to ED her BP was found to be 220/128. CT head showed no acute findings. While hospitalized patients BP has been reasonably controlled between 174-116/93-68.  EEG was obtained showing no epileptiform activity but did show sharp transients over left hemisphere which posses epileptic potential .   Past Medical History  Diagnosis Date  . Hypertension   . Diabetes mellitus   . CVA (cerebral vascular accident)   . Stroke     History reviewed. No pertinent past surgical history.  No family history on file.  Social History:  reports that he has never smoked. He does not have any smokeless tobacco history on file. He reports that he does not drink alcohol or use illicit drugs.  No Known Allergies  Medications:    Prior to Admission:  Prescriptions prior to admission  Medication Sig Dispense Refill  . amLODipine (NORVASC) 10 MG tablet Take 10 mg by mouth daily.      Marland Kitchen aspirin 325 MG tablet Take 325 mg by mouth daily.      . cloNIDine (CATAPRES) 0.3 MG tablet Take 0.3 mg by mouth every 8 (eight) hours.      Marland Kitchen donepezil (ARICEPT) 10 MG tablet Take 10 mg by mouth daily.      . furosemide (LASIX) 20 MG tablet Take 20 mg by mouth every Monday, Wednesday, and Friday.      Marland Kitchen glipiZIDE (GLUCOTROL XL) 10 MG 24 hr tablet Take 10 mg by mouth 2 (two) times daily.      . insulin glargine (LANTUS) 100 UNIT/ML injection Inject 36 Units into the skin at bedtime.      Marland Kitchen losartan (COZAAR) 100 MG tablet Take 100 mg by mouth daily.      . metFORMIN (GLUCOPHAGE) 500 MG tablet Take 500 mg by mouth 2 (two) times daily with a meal.      . metoprolol (LOPRESSOR) 100 MG tablet Take 100 mg by mouth 2 (two) times daily.      . pioglitazone  (ACTOS) 30 MG tablet Take 30 mg by mouth daily.      . simvastatin (ZOCOR) 20 MG tablet Take 20 mg by mouth every evening.      . zolpidem (AMBIEN) 5 MG tablet Take 5 mg by mouth at bedtime.       Scheduled:   . amLODipine  10 mg Oral Daily  . aspirin  325 mg Oral Daily  . cloNIDine  0.3 mg Oral Q8H  . donepezil  10 mg Oral Daily  . enoxaparin (LOVENOX) injection  40 mg Subcutaneous Q24H  . glipiZIDE  10 mg Oral QAC breakfast  . insulin aspart  0-15 Units Subcutaneous TID WC  . insulin aspart  0-5 Units Subcutaneous QHS  . insulin glargine  36 Units Subcutaneous QHS  . labetalol  200 mg Oral BID  . pioglitazone  30 mg Oral Q breakfast  . potassium chloride  10 mEq Oral Once  . simvastatin  20 mg Oral QPM  . sodium chloride  3 mL Intravenous Q12H  . zolpidem  5 mg Oral QHS  . DISCONTD:  glipiZIDE  10 mg Oral BID WC  . DISCONTD: glipiZIDE  10 mg Oral BID AC  . DISCONTD: losartan  100 mg Oral Daily    Review of Systems - General ROS: negative for - chills, fatigue, fever or hot flashes Hematological and Lymphatic ROS: negative for - bruising, fatigue, jaundice or pallor Endocrine ROS: negative for - hair pattern changes, hot flashes, mood swings or skin changes Respiratory ROS: negative for - cough, hemoptysis, orthopnea or wheezing Cardiovascular ROS: negative for - dyspnea on exertion, orthopnea, palpitations or shortness of breath Gastrointestinal ROS: negative for - abdominal pain, appetite loss, blood in stools, diarrhea or hematemesis Musculoskeletal ROS: negative for - joint pain, joint stiffness, joint swelling or muscle pain Neurological ROS: positive for - shaking, weakness, dementia, stroke Dermatological ROS: negative for dry skin, pruritus and rash   Blood pressure 154/84, pulse 73, temperature 98.1 F (36.7 C), temperature source Oral, resp. rate 16, height 5\' 11"  (1.803 m), weight 93.8 kg (206 lb 12.7 oz), SpO2 100.00%.   Neurologic Examination:  Mental  Status: Alert, not oriented, will laugh when he does not know the answer.  He shows some difficulty expressing his thoughts.   Able to follow simple commands without difficulty. Cranial Nerves: II: Discs flat bilaterally; Visual fields grossly normal, pupils equal, round, reactive to light and accommodation III,IV, VI: ptosis not present, extra-ocular motions intact bilaterally V,VII: smile asymmetric on right, facial light touch sensation decreased on right lower face VIII: hearing normal bilaterally IX,X: Uvula  Rises in midline XI: bilateral shoulder shrug XII: midline tongue extension Motor: Action tremor noted when holding arms out stretched Right : Upper extremity   4-/5 Pronator drift   Left:     Upper extremity   5/5  Lower extremity   4-/5     Lower extremity   4/5 Increased tone on the right Sensory: Pinprick and light touch intact throughout, bilaterally Deep Tendon Reflexes: 2+ bilateral UE, 1+ right KJ, 2+ left KJ depressed AJ.  Plantars: Right: downgoing   Left: downgoing Cerebellar: normal finger-to-nose with limitations due to weakness on the right CV: pulses palpable throughout      Lab Results  Component Value Date/Time   CHOL  Value: 212        ATP III CLASSIFICATION:  <200     mg/dL   Desirable  161-096  mg/dL   Borderline High  >=045    mg/dL   High       * 07/24/8117  6:45 AM    Results for orders placed during the hospital encounter of 01/23/12 (from the past 48 hour(s))  MRSA PCR SCREENING     Status: Normal   Collection Time   01/23/12  7:21 PM      Component Value Range Comment   MRSA by PCR NEGATIVE  NEGATIVE   TROPONIN I     Status: Normal   Collection Time   01/23/12  7:33 PM      Component Value Range Comment   Troponin I <0.30  <0.30 ng/mL   CBC     Status: Abnormal   Collection Time   01/23/12  7:33 PM      Component Value Range Comment   WBC 8.4  4.0 - 10.5 K/uL    RBC 3.90 (*) 4.22 - 5.81 MIL/uL    Hemoglobin 11.9 (*) 13.0 - 17.0 g/dL     HCT 14.7 (*) 82.9 - 52.0 %    MCV 91.3  78.0 - 100.0 fL  MCH 30.5  26.0 - 34.0 pg    MCHC 33.4  30.0 - 36.0 g/dL    RDW 40.9  81.1 - 91.4 %    Platelets 162  150 - 400 K/uL   CREATININE, SERUM     Status: Abnormal   Collection Time   01/23/12  7:33 PM      Component Value Range Comment   Creatinine, Ser 1.37 (*) 0.50 - 1.35 mg/dL    GFR calc non Af Amer 52 (*) >90 mL/min    GFR calc Af Amer 60 (*) >90 mL/min   GLUCOSE, CAPILLARY     Status: Abnormal   Collection Time   01/23/12  9:41 PM      Component Value Range Comment   Glucose-Capillary 217 (*) 70 - 99 mg/dL    Comment 1 Notify RN      Comment 2 Documented in Chart     TROPONIN I     Status: Normal   Collection Time   01/24/12  1:25 AM      Component Value Range Comment   Troponin I <0.30  <0.30 ng/mL   TROPONIN I     Status: Normal   Collection Time   01/24/12  7:20 AM      Component Value Range Comment   Troponin I <0.30  <0.30 ng/mL   GLUCOSE, CAPILLARY     Status: Normal   Collection Time   01/24/12  8:35 AM      Component Value Range Comment   Glucose-Capillary 70  70 - 99 mg/dL    Comment 1 Documented in Chart      Comment 2 Notify RN     GLUCOSE, CAPILLARY     Status: Abnormal   Collection Time   01/24/12  9:31 AM      Component Value Range Comment   Glucose-Capillary 121 (*) 70 - 99 mg/dL    Comment 1 Notify RN      Comment 2 Documented in Chart     GLUCOSE, CAPILLARY     Status: Abnormal   Collection Time   01/24/12 12:01 PM      Component Value Range Comment   Glucose-Capillary 123 (*) 70 - 99 mg/dL    Comment 1 Documented in Chart      Comment 2 Notify RN     GLUCOSE, CAPILLARY     Status: Abnormal   Collection Time   01/24/12  4:41 PM      Component Value Range Comment   Glucose-Capillary 131 (*) 70 - 99 mg/dL    Comment 1 Documented in Chart      Comment 2 Notify RN     GLUCOSE, CAPILLARY     Status: Abnormal   Collection Time   01/24/12  9:54 PM      Component Value Range Comment    Glucose-Capillary 174 (*) 70 - 99 mg/dL    Comment 1 Notify RN     IRON AND TIBC     Status: Normal   Collection Time   01/25/12  6:30 AM      Component Value Range Comment   Iron 65  42 - 135 ug/dL    TIBC 782  956 - 213 ug/dL    Saturation Ratios 30  20 - 55 %    UIBC 154  125 - 400 ug/dL   RETICULOCYTES     Status: Abnormal   Collection Time   01/25/12  6:30 AM      Component  Value Range Comment   Retic Ct Pct 1.2  0.4 - 3.1 %    RBC. 3.38 (*) 4.22 - 5.81 MIL/uL    Retic Count, Manual 40.6  19.0 - 186.0 K/uL   BASIC METABOLIC PANEL     Status: Abnormal   Collection Time   01/25/12  6:30 AM      Component Value Range Comment   Sodium 143  135 - 145 mEq/L    Potassium 3.4 (*) 3.5 - 5.1 mEq/L    Chloride 109  96 - 112 mEq/L    CO2 24  19 - 32 mEq/L    Glucose, Bld 97  70 - 99 mg/dL    BUN 33 (*) 6 - 23 mg/dL    Creatinine, Ser 9.60 (*) 0.50 - 1.35 mg/dL    Calcium 8.7  8.4 - 45.4 mg/dL    GFR calc non Af Amer 41 (*) >90 mL/min    GFR calc Af Amer 48 (*) >90 mL/min   CBC     Status: Abnormal   Collection Time   01/25/12  6:30 AM      Component Value Range Comment   WBC 5.0  4.0 - 10.5 K/uL    RBC 3.38 (*) 4.22 - 5.81 MIL/uL    Hemoglobin 10.7 (*) 13.0 - 17.0 g/dL    HCT 09.8 (*) 11.9 - 52.0 %    MCV 93.5  78.0 - 100.0 fL    MCH 31.7  26.0 - 34.0 pg    MCHC 33.9  30.0 - 36.0 g/dL    RDW 14.7  82.9 - 56.2 %    Platelets 127 (*) 150 - 400 K/uL   GLUCOSE, CAPILLARY     Status: Normal   Collection Time   01/25/12  6:32 AM      Component Value Range Comment   Glucose-Capillary 81  70 - 99 mg/dL    Comment 1 Notify RN       No results found.   Assessment/Plan:   66 YO male with known dementia, extensive left MCA infarct, right BG infarct who presented to ED in hypertensive urgency.  Patient had shaking at home prior to presentation.  No further shaking noted.  EEG performed and shows evidence of the left infarct but also shows epileptogenic potential.  With risk  factor of a focal insult and episode of shaking, seizure very high on the differential.  Recommend treatment.    Recommend: 1) Keppra 500 mg BID PO 2) Continue BP control 3) seizure precautions while in hospital 4) Follow up with PCP as out patient.  5) Consider increase in statin   Felicie Morn PA-C [facility] [phone]  01/25/2012, 11:29 AM    Patient seen and examined.  Clinical course and management discussed.  Necessary edits performed.  I agree with the above.  Thana Farr, MD [facility] [phone]  01/25/2012  1:37 PM

## 2012-01-26 DIAGNOSIS — R609 Edema, unspecified: Secondary | ICD-10-CM

## 2012-01-26 DIAGNOSIS — M7989 Other specified soft tissue disorders: Secondary | ICD-10-CM

## 2012-01-26 LAB — GLUCOSE, CAPILLARY
Glucose-Capillary: 224 mg/dL — ABNORMAL HIGH (ref 70–99)
Glucose-Capillary: 147 mg/dL — ABNORMAL HIGH (ref 70–99)
Glucose-Capillary: 157 mg/dL — ABNORMAL HIGH (ref 70–99)
Glucose-Capillary: 342 mg/dL — ABNORMAL HIGH (ref 70–99)

## 2012-01-26 LAB — BASIC METABOLIC PANEL
BUN: 26 mg/dL — ABNORMAL HIGH (ref 6–23)
Calcium: 8.6 mg/dL (ref 8.4–10.5)
GFR calc Af Amer: 61 mL/min — ABNORMAL LOW (ref >90)
GFR calc non Af Amer: 53 mL/min — ABNORMAL LOW (ref >90)
Glucose, Bld: 133 mg/dL — ABNORMAL HIGH (ref 70–99)
Potassium: 3.5 mEq/L (ref 3.5–5.1)
Sodium: 142 mEq/L (ref 135–145)

## 2012-01-26 LAB — CBC
Hemoglobin: 11.2 g/dL — ABNORMAL LOW (ref 13.0–17.0)
MCH: 30.2 pg (ref 26.0–34.0)
MCHC: 32.8 g/dL (ref 30.0–36.0)
Platelets: 133 10*3/uL — ABNORMAL LOW (ref 150–400)
RBC: 3.71 MIL/uL — ABNORMAL LOW (ref 4.22–5.81)

## 2012-01-26 MED ORDER — DEXTROSE 50 % IV SOLN
INTRAVENOUS | Status: AC
  Filled 2012-01-26: qty 50

## 2012-01-26 MED ORDER — INSULIN GLARGINE 100 UNIT/ML ~~LOC~~ SOLN: 30.0000 [IU] | Freq: Every day | SUBCUTANEOUS | Status: DC

## 2012-01-26 MED ORDER — SENNA 8.6 MG PO TABS
2.0000 | ORAL_TABLET | Freq: Every day | ORAL | Status: DC
  Administered 2012-01-26: 17.2 mg via ORAL
  Filled 2012-01-26 (×3): qty 2

## 2012-01-26 MED ORDER — DOCUSATE SODIUM 100 MG PO CAPS
100.0000 mg | ORAL_CAPSULE | Freq: Two times a day (BID) | ORAL | Status: DC
  Administered 2012-01-26 – 2012-01-27 (×3): 100 mg via ORAL
  Filled 2012-01-26 (×3): qty 1

## 2012-01-26 MED ORDER — GLUCOSE 40 % PO GEL
ORAL | Status: AC
  Filled 2012-01-26: qty 1

## 2012-01-26 MED ORDER — DEXTROSE 50 % IV SOLN: 50.0000 mL | Freq: Once | INTRAVENOUS | Status: AC | PRN

## 2012-01-26 MED ORDER — INSULIN GLARGINE 100 UNIT/ML ~~LOC~~ SOLN
20.0000 [IU] | Freq: Every day | SUBCUTANEOUS | Status: DC
  Administered 2012-01-26: 20 [IU] via SUBCUTANEOUS

## 2012-01-26 NOTE — Progress Notes (Signed)
Co-signed for Meredith Leonard RN/BSN for assessments, IV assessments, Medication administration, I and O's, Care plans, patient educations, and progress notes. Ranger Petrich M,RN/BSN 

## 2012-01-26 NOTE — Progress Notes (Signed)
Physical Therapy Treatment Patient Details Name: Robert Mcclure MRN: [id number] DOB: 1945-04-23 Today's Date: 01/26/2012 Time: 7829-5621 PT Time Calculation (min): 20 min  PT Assessment / Plan / Recommendation Comments on Treatment Session  Pt continues to improve. One episode of near buckling of RLEwith incr ambulation. Denies any recent falls. Able to perform 10 minisquats in standing with no LOB.    Follow Up Recommendations  Home health PT;Supervision - Intermittent     Does the patient have the potential to tolerate intense rehabilitation     Barriers to Discharge        Equipment Recommendations  None recommended by PT;None recommended by OT    Recommendations for Other Services    Frequency Min 3X/week   Plan Discharge plan remains appropriate;Frequency remains appropriate    Precautions / Restrictions     Pertinent Vitals/Pain No s/s of pain    Mobility  Bed Mobility Bed Mobility: Supine to Sit;Sitting - Scoot to Edge of Bed;Sit to Supine Supine to Sit: 6: Modified independent (Device/Increase time);HOB flat Sitting - Scoot to Edge of Bed: 7: Independent Sit to Supine: 6: Modified independent (Device/Increase time);HOB flat Transfers Transfers: Sit to Stand;Stand to Sit Sit to Stand: 5: Supervision;With upper extremity assist;From bed Stand to Sit: 5: Supervision;With upper extremity assist;To bed Details for Transfer Assistance: x 2; with RW required cues for safe use of DME/hand placement; with cane, no cuing needed Ambulation/Gait Ambulation/Gait Assistance: 4: Min guard Ambulation Distance (Feet): 150 Feet Assistive device: Rolling walker;Straight cane Ambulation/Gait Assistance Details: RW x 100 ft with good ability to steer RW, slight Rt foot drag/scuffing floor during swing; cane x 50 ft with incr dragging of RLE noted; no LOB Gait Pattern: Step-through pattern;Decreased stride length;Wide base of support;Decreased hip/knee flexion - right;Decreased  dorsiflexion - right Gait velocity: decreased    Exercises     PT Diagnosis:    PT Problem List:   PT Treatment Interventions:     PT Goals Acute Rehab PT Goals Pt will go Supine/Side to Sit: with modified independence;with HOB 0 degrees PT Goal: Supine/Side to Sit - Progress: Progressing toward goal Pt will go Sit to Supine/Side: with modified independence;with HOB 0 degrees PT Goal: Sit to Supine/Side - Progress: Progressing toward goal Pt will go Sit to Stand: with modified independence;with upper extremity assist PT Goal: Sit to Stand - Progress: Progressing toward goal Pt will go Stand to Sit: with modified independence;with upper extremity assist PT Goal: Stand to Sit - Progress: Progressing toward goal Pt will Ambulate: 51 - 150 feet;with modified independence;with least restrictive assistive device PT Goal: Ambulate - Progress: Progressing toward goal  Visit Information  Last PT Received On: 01/26/12 Assistance Needed: +1 Reason Eval/Treat Not Completed: Medical issues which prohibited therapy    Subjective Data  Subjective: Reports the medicine has made him sleepy. Patient Stated Goal: wants to return to his apartment   Cognition  Overall Cognitive Status: Difficult to assess Difficult to assess due to: Impaired communication Arousal/Alertness: Lethargic Behavior During Session: [facility] for tasks performed Cognition - Other Comments: Pt stating he has steps to go up into his house (very broken language, responding to yes/no questions), once in the gym and shown the steps, he shook his head and reported he has no steps into apt building    Balance     End of Session PT - End of Session Equipment Utilized During Treatment: Gait belt Activity Tolerance: Patient tolerated treatment well Patient left: in bed;with call bell/phone within  reach Nurse Communication: Mobility status   GP     Sharline Lehane 01/26/2012, 2:55 PM Pager [phone]

## 2012-01-26 NOTE — Progress Notes (Signed)
Hypoglycemic Event  CBG: 49  Treatment: glucose gel 1 tube and IV Dext 50% 25cc  Symptoms: Shaky  Follow-up CBG: Time:0630 CBG Result:147  Possible Reasons for Event:  Inadequate meal intake and medication regimen  Comments/MD notified:Dr.  Devine-Magick notified and verbal order to hold the 0730 dose of insulin and reduce the dosage of nighttime Lantus to 30 units.    Robert Mcclure, Robert Mcclure  Remember to initiate Hypoglycemia Order Set & complete

## 2012-01-26 NOTE — Progress Notes (Signed)
VASCULAR LAB PRELIMINARY  PRELIMINARY  PRELIMINARY  PRELIMINARY  Right lower extremity venous duplex completed.    Preliminary report:  Right:  No obvious evidence of DVT, superficial thrombosis, or Baker's cyst.  Peroneal vein not adequately visualized.  Talley Kreiser, RVT 01/26/2012, 11:28 AM

## 2012-01-26 NOTE — Progress Notes (Signed)
PT Cancellation Note  Patient Details Name: Robert Mcclure MRN: [id number] DOB: [birth year]   Cancelled Treatment:    Reason Eval/Treat Not Completed: Medical issues which prohibited therapy: noted order for doppler of Rt LE to r/o DVT. Will await test results.   Shandricka Monroy 01/26/2012, 11:29 AM Pager [phone]

## 2012-01-26 NOTE — Progress Notes (Signed)
   CARE MANAGEMENT NOTE 01/26/2012  Patient:  JJ, ENYEART   Account Number:  [id number]  Date Initiated:  01/26/2012  Documentation initiated by:  [facility]  Subjective/Objective Assessment:   residual dysphasia and residual right-sided weakness, diabetes mellitus and hypertension     Action/Plan:   lives at home, has aide that comes M-F from 830am-10am with [facility]. [facility] send [facility] RN also.   Anticipated DC Date:  01/26/2012   Anticipated DC Plan:  HOME W HOME HEALTH SERVICES      DC Planning Services  CM consult      [facility] Choice  HOME HEALTH   Choice offered to / List presented to:  C-4 Adult Children        HH arranged  HH-1 RN  HH-2 PT      [facility] agency  [facility]   Status of service:  Completed, signed off Medicare Important Message given?   (If response is "NO", the following Medicare IM given date fields will be blank) Date Medicare IM given:   Date Additional Medicare IM given:    Discharge Disposition:  HOME W HOME HEALTH SERVICES  Per UR Regulation:    If discussed at Long Length of Stay Meetings, dates discussed:    Comments:  01/26/2012 1545 NCM spoke to pt and states to call his dtr. NCM contact [name] # [phone], his dtr. States he has PCS services and HH with [facility]. Wanted to continue services with [facility]. He has RW and cane at home. Contacted [facility] for HH. States he is active. Requested order and facesheet be faxed to [phone]. Faxed to [facility] for resumption of care and [facility] PT added. Isidoro Donning RN CCM Case Mgmt phone [phone]

## 2012-01-26 NOTE — Progress Notes (Signed)
Hypoglycemic Event  CBG: *35 Treatment: D50 IV 50 mL  Symptoms: Sweaty with decrease level of consciousness  Follow-up CBG: Time:0600 CBG Result:49  Possible Reasons for Event: inadequate meal intake and medication regimen Comments/MD notified    Dorna Bloom, Peng-Sian  Remember to initiate Hypoglycemia Order Set & complete

## 2012-01-26 NOTE — Progress Notes (Signed)
[facility] Progress Note    Robert Mcclure ZOX:[id number] DOB: [birth year] DOA: 01/23/2012 PCP: Jeri Cos, MD  Brief narrative: 67 y.o. 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 The patient's nephew came home and found him to be "shaking" and so brought him to the ED. He has no prior history of seizures, no loss of consciousness reported, or incontinence reported. He was seen in the ED and his blood pressures were noted to be markedly elevated -up to 220/128. CT scan of his head was done and showed no acute findings, and chest x-ray showed no acute infiltrates. The patient received a total of 60 of IV labetalol but his blood pressures remained markedly elevated in the 180s/low 100s.   Assessment/Plan:  Malignant hypertension/hypertensive urgency The patient was admitted on 01/23/12 from the ED with severe HTN and shaking - he was already on multiple oral meds at home and they were resumed - immediately - including amlodipine 10 mg daily , clonidine 0.3 TID, cozaar 100 daily and labetalol 200 BID. Stopped cozaar 10/10 due to rising creatinine     Possible seizure activity versus hypertensive encephalopathy Exact etiology unclear - - CT scan of the head without acute findings. EEG from 10/9 showing potential epileptogenic area. Neurology - dr. Thana Farr consulted and she recommended we start patient on keppra 500 BID - shich we did from 10/10 . Patient became more somnolent after initiation of keppra 10/11  Diabetes mellitus With developing renal failure he is at high risk for hypoglycemia - Glipizide decreased to 10 mg daily 10/10 . Patient did devloep symptomatic hypoglycemia on the morning of 10/11. Glipizide stopped 10/11 and Lantus decreased to 20 units qhs on 10/11.  History of CVA 2012 - residual right sided weakness and  dysphagia No focal neurologic deficits beyond the patient's baseline  Acute Renal insufficiency With better BP control patient has developed acute renal failure  By 10/10 - ? Hypoperfusion. On 10/10 ARB placed on hold . By 10/11 creatinine has improved    Normocytic anemia Anemia panel pending   Code Status: Full code Disposition Plan: home   Consultants: Neurology   Procedures: EEG    DVT prophylaxis: Lovenox  HPI/Subjective:    Objective: Blood pressure 176/85, pulse 78, temperature 97.5 F (36.4 C), temperature source Oral, resp. rate 18, height 5\' 11"  (1.803 m), weight 93.8 kg (206 lb 12.7 oz), SpO2 98.00%. No intake or output data in the 24 hours ending 01/26/12 0944  Patient Vitals for the past 24 hrs:  BP Temp Temp src Pulse Resp SpO2  01/26/12 0530 176/85 mmHg 97.5 F (36.4 C) Oral 78  18  98 %  01/25/12 2134 172/94 mmHg 98.2 F (36.8 C) Oral 71  20  100 %  01/25/12 1400 147/82 mmHg 97.3 F (36.3 C) Oral 77  18  98 %  01/25/12 1000 155/82 mmHg 98.2 F (36.8 C) Oral 78  18  100 %    Exam: General: No acute respiratory distress Lungs: Clear to auscultation bilaterally without wheezes or crackles Cardiovascular: Regular rate and rhythm without murmur gallop or rub  Abdomen: Nontender, nondistended, soft, bowel sounds positive, no rebound, no ascites, no appreciable mass Neuro:  Alert, oriented, 4/5 strength in right upper extremity with 5/5 strength in left upper extremity, cranial nerves intact bilaterally to gross exam  Data Reviewed: Basic Metabolic Panel:  Lab 01/26/12  0710 01/25/12 0630 01/23/12 1933 01/23/12 1008  NA 142 143 -- 141  K 3.5 3.4* -- 4.0  CL 108 109 -- 107  CO2 23 24 -- 23  GLUCOSE 133* 97 -- 211*  BUN 26* 33* -- 21  CREATININE 1.35 1.65* 1.37* 1.21  CALCIUM 8.6 8.7 -- 9.1  MG -- -- -- --  PHOS -- -- -- --   Liver Function Tests:  Lab 01/23/12 1008  AST 15  ALT 11  ALKPHOS 48  BILITOT 0.4  PROT 7.3  ALBUMIN 3.6    CBC:  Lab 01/26/12 0710 01/25/12 0630 01/23/12 1933 01/23/12 1008  WBC 5.7 5.0 8.4 8.8  NEUTROABS -- -- -- 7.3  HGB 11.2* 10.7* 11.9* 12.6*  HCT 34.1* 31.6* 35.6* 37.3*  MCV 91.9 93.5 91.3 92.6  PLT 133* 127* 162 172   Cardiac Enzymes:  Lab 01/24/12 0720 01/24/12 0125 01/23/12 1933  CKTOTAL -- -- --  CKMB -- -- --  CKMBINDEX -- -- --  TROPONINI <0.30 <0.30 <0.30   CBG:  Lab 01/26/12 0808 01/26/12 0629 01/26/12 0607 01/26/12 0535 01/25/12 2137  GLUCAP 157* 147* 49* 30* 224*    Recent Results (from the past 240 hour(s))  MRSA PCR SCREENING     Status: Normal   Collection Time   01/23/12  7:21 PM      Component Value Range Status Comment   MRSA by PCR NEGATIVE  NEGATIVE Final      Studies:  Recent x-ray studies have been reviewed in detail by the Attending Physician  Scheduled Meds:     . amLODipine  10 mg Oral Daily  . aspirin  325 mg Oral Daily  . atorvastatin  20 mg Oral q1800  . cloNIDine  0.3 mg Oral Q8H  . dextrose      . dextrose      . dextrose      . donepezil  10 mg Oral Daily  . enoxaparin (LOVENOX) injection  40 mg Subcutaneous Q24H  . insulin aspart  0-15 Units Subcutaneous TID WC  . insulin aspart  0-5 Units Subcutaneous QHS  . insulin glargine  20 Units Subcutaneous QHS  . labetalol  200 mg Oral BID  . levETIRAcetam  500 mg Oral BID  . pioglitazone  30 mg Oral Q breakfast  . potassium chloride  10 mEq Oral Once  . sodium chloride  3 mL Intravenous Q12H  . DISCONTD: glipiZIDE  10 mg Oral QAC breakfast  . DISCONTD: insulin glargine  30 Units Subcutaneous QHS  . DISCONTD: insulin glargine  36 Units Subcutaneous QHS  . DISCONTD: simvastatin  20 mg Oral QPM  . DISCONTD: simvastatin  40 mg Oral QPM  . DISCONTD: zolpidem  5 mg Oral QHS      Chaka Boyson, MD [phone] If 7PM-7AM, please contact night-coverage [email or web address] Password [email or web address] 01/26/2012, 9:44 AM   LOS: 3 days

## 2012-01-26 NOTE — Progress Notes (Signed)
Inpatient Diabetes Program Recommendations  AACE/ADA: New Consensus Statement on Inpatient Glycemic Control (2013)  Target Ranges:  Prepandial:   less than 140 mg/dL      Peak postprandial:   less than 180 mg/dL (1-2 hours)      Critically ill patients:  140 - 180 mg/dL   Results for PAXON, PROPES (MRN [id number]) as of 01/26/2012 14:04  Ref. Range 01/26/2012 05:35 01/26/2012 06:07 01/26/2012 06:29 01/26/2012 08:08 01/26/2012 10:54 01/26/2012 11:45  Glucose-Capillary Latest Range: 70-99 mg/dL 30 (LL) 49 (L) 147 (H) 157 (H) 361 (H) 342 (H)   Noted patient had hypoglycemia this morning after receiving 36 units Lantus last night.  Reduced Lantus to 20 units QHS as a result.  Agree.  Noted patient did not get any coverage this morning b/c of low CBG and as a result her CBG was up to 342 mg/dl at lunch.  Inpatient Diabetes Program Recommendations Insulin - Meal Coverage: May want to add low dose meal coverage while patient in hospital to help with postprandial elevations- Can add home Glipizide back once patient closer to discharge- Recommend Novolog 3 units tid with meals  Note: Will follow. Ambrose Finland RN, MSN, CDE Diabetes Coordinator Inpatient Diabetes Program [phone]

## 2012-01-27 LAB — GLUCOSE, CAPILLARY: Glucose-Capillary: 116 mg/dL — ABNORMAL HIGH (ref 70–99)

## 2012-01-27 MED ORDER — LEVETIRACETAM 500 MG PO TABS: 500.0000 mg | ORAL_TABLET | Freq: Two times a day (BID) | ORAL | Status: AC

## 2012-01-27 MED ORDER — LOSARTAN POTASSIUM 100 MG PO TABS: 50.0000 mg | ORAL_TABLET | Freq: Every day | ORAL | Status: AC

## 2012-01-27 MED ORDER — INSULIN GLARGINE 100 UNIT/ML ~~LOC~~ SOLN: 30.0000 [IU] | Freq: Every day | SUBCUTANEOUS | Status: AC

## 2012-01-27 NOTE — Progress Notes (Signed)
Patient discharge this morning, assessments remained unchanged at this time, awaiting for transportation.

## 2012-01-27 NOTE — Discharge Summary (Signed)
Physician Discharge Summary  Robert Mcclure ZOX:[id number] DOB: [birth year] DOA: 01/23/2012  PCP: Jeri Cos, MD  Admit date: 01/23/2012 Discharge date: 01/27/2012  Recommendations for Outpatient Follow-up:  1. Patient started on new antiepileptic - keppra - monitor for side effects 2. Patient will need BMET to monitor renal function  3. Stopped Glipizide due to hypoglycemia   Discharge Diagnoses:  Seizure  HTN (hypertension), malignant Hx of  CVA   Diabetes mellitus Hyperlipidemia CKD stage 2   Discharge Condition: back to baseline   Diet recommendation: heart healthy  Filed Weights   01/23/12 1930  Weight: 93.8 kg (206 lb 12.7 oz)    History of present illness:   66 y.o. 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 The patient's nephew came home and found him to be "shaking" and so brought him to the ED. He has no prior history of seizures, no loss of consciousness reported, or incontinence reported. He was seen in the ED and his blood pressures were noted to be markedly elevated -up to 220/128. CT scan of his head was done and showed no acute findings, and chest x-ray showed no acute infiltrates. The patient received a total of 60 of IV labetalol but his blood pressures remained markedly elevated in the 180s/low 100s.     Hospital Course:   Malignant hypertension/hypertensive urgency  The patient was admitted on 01/23/12 from the ED with severe HTN and shaking - he was already on multiple oral meds at home and they were resumed - immediately - including amlodipine 10 mg daily , clonidine 0.3 TID, cozaar 100 daily and labetalol 200 BID. Stopped cozaar 10/10 due to rising creatinine . We resumed metoprolol at discharge and stopped labetalol. We also resumed lasix at discharge and 50 mg a day of cozaar.  Possible seizure activity  versus hypertensive encephalopathy  Exact etiology unclear - - CT scan of the head without acute findings. EEG from 10/9 showing potential epileptogenic area. Neurology - dr. Thana Farr consulted and she recommended we start patient on keppra 500 BID - shich we did from 10/10 .   Diabetes mellitus  With  failure he is at high risk for hypoglycemia - Glipizide decreased to 10 mg daily 10/10 . Patient did develop symptomatic hypoglycemia on the morning of 10/11. Glipizide stopped 10/11 and Lantus decreased to 20 units qhs on 10/11. Plan to decrease Lantus 30 units dose .   History of CVA 2012 - residual right sided weakness and dysphagia  No focal neurologic deficits beyond the patient's baseline   Acute Renal insufficiency on CKD stage 2 With better BP control patient has developed acute renal failure By 10/10 - ? Hypoperfusion. On 10/10 ARB placed on hold . By 10/11 creatinine has improved   Normocytic anemia  Anemia panel B12 374, folate 19, ferritin 174, saturation 30% - studies c/w anemia of chronic ds   Procedures:  EEg  Consultations:  Neurology  Discharge Exam: Filed Vitals:   01/26/12 1828 01/26/12 2135 01/27/12 0134 01/27/12 0732  BP: 148/75 150/86 146/80 151/80  Pulse: 70 71 85 64  Temp: 97.8 F (36.6 C) 98.3 F (36.8 C) 98.2 F (36.8 C) 98.1 F (36.7 C)  TempSrc: Oral Oral Oral Oral  Resp: 18 18 20 20   Height:      Weight:      SpO2: 100% 100% 100% 100%  General: axox3 Cardiovascular: RRR Respiratory: CTab  Discharge Instructions  Discharge Orders    Future Orders Please Complete By Expires   Diet - low sodium heart healthy      Increase activity slowly          Medication List     As of 01/27/2012  8:04 AM    STOP taking these medications         glipiZIDE 10 MG 24 hr tablet   Commonly known as: GLUCOTROL XL      zolpidem 5 MG tablet   Commonly known as: AMBIEN      TAKE these medications         amLODipine 10 MG tablet    Commonly known as: NORVASC   Take 10 mg by mouth daily.      aspirin 325 MG tablet   Take 325 mg by mouth daily.      cloNIDine 0.3 MG tablet   Commonly known as: CATAPRES   Take 0.3 mg by mouth every 8 (eight) hours.      donepezil 10 MG tablet   Commonly known as: ARICEPT   Take 10 mg by mouth daily.      furosemide 20 MG tablet   Commonly known as: LASIX   Take 20 mg by mouth every Monday, Wednesday, and Friday.      insulin glargine 100 UNIT/ML injection   Commonly known as: LANTUS   Inject 30 Units into the skin at bedtime.      levETIRAcetam 500 MG tablet   Commonly known as: KEPPRA   Take 1 tablet (500 mg total) by mouth 2 (two) times daily.      losartan 100 MG tablet   Commonly known as: COZAAR   Take 0.5 tablets (50 mg total) by mouth daily.      metFORMIN 500 MG tablet   Commonly known as: GLUCOPHAGE   Take 500 mg by mouth 2 (two) times daily with a meal.      metoprolol 100 MG tablet   Commonly known as: LOPRESSOR   Take 100 mg by mouth 2 (two) times daily.      pioglitazone 30 MG tablet   Commonly known as: ACTOS   Take 30 mg by mouth daily.      simvastatin 20 MG tablet   Commonly known as: ZOCOR   Take 20 mg by mouth every evening.           Follow-up Information    Follow up with [facility]. (Home Health RN, Physical Therapy and [facility] (aide))    Contact information:   [phone]      Schedule an appointment as soon as possible for a visit with Jeri Cos, MD.   Contact information:   [address] [place] [address] [phone]           The results of significant diagnostics from this hospitalization (including imaging, microbiology, ancillary and laboratory) are listed below for reference.    Significant Diagnostic Studies: Ct Head Wo Contrast  01/23/2012  *RADIOLOGY REPORT*  Clinical Data: Tremors with urinary incontinence.  CT HEAD WITHOUT CONTRAST  Technique:   Contiguous axial images were obtained from the base of the skull through the vertex without contrast.  Comparison: None.  Findings: There is no evidence for acute infarction, intracranial hemorrhage, mass lesion, hydrocephalus, or extra-axial fluid. There is mild to moderate premature atrophy with chronic microvascular ischemic change.  There is a  moderate sized remote left hemisphere infarct affecting the left basal ganglia, external capsule, insular cortex, parietal cortex, temporal cortex, and associated subcortical white matter.  Compensatory enlargement left lateral ventricle has resulted. There is a smaller area of chronic infarction affecting the right basal ganglia.  The calvarium is intact.  There is advanced vascular calcification. There are no CT signs of proximal vascular thrombosis.  Paranasal sinuses and mastoids are unremarkable.  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 Smaller right basal ganglia infarct.  Atrophy with chronic microvascular ischemic change.  No visible acute stroke or intracranial hemorrhage.   Original Report Authenticated By: Elsie Stain, M.D.    Dg Chest Portable 1 View  01/23/2012  *RADIOLOGY REPORT*  Clinical Data: Hypertension.  Diabetic.  Nonsmoker.  PORTABLE CHEST - 1 VIEW  Comparison: None.  Findings: Cardiomegaly.  Scoliosis with mildly tortuous aorta.  Central pulmonary vascular prominence.  No infiltrate, congestive heart failure or pneumothorax.  The patient would eventually benefit from follow-up two-view chest with cardiac leads removed.  IMPRESSION: Cardiomegaly.  Scoliosis with mildly tortuous aorta.  Central pulmonary vascular prominence.  No infiltrate, congestive heart failure or pneumothorax.  The patient would eventually benefit from follow-up two-view chest with cardiac leads removed.   Original Report Authenticated By: Fuller Canada, M.D.     Microbiology: Recent Results (from the past 240 hour(s))  MRSA PCR  SCREENING     Status: Normal   Collection Time   01/23/12  7:21 PM      Component Value Range Status Comment   MRSA by PCR NEGATIVE  NEGATIVE Final      Labs: Basic Metabolic Panel:  Lab 01/26/12 0981 01/25/12 0630 01/23/12 1933 01/23/12 1008  NA 142 143 -- 141  K 3.5 3.4* -- 4.0  CL 108 109 -- 107  CO2 23 24 -- 23  GLUCOSE 133* 97 -- 211*  BUN 26* 33* -- 21  CREATININE 1.35 1.65* 1.37* 1.21  CALCIUM 8.6 8.7 -- 9.1  MG -- -- -- --  PHOS -- -- -- --   Liver Function Tests:  Lab 01/23/12 1008  AST 15  ALT 11  ALKPHOS 48  BILITOT 0.4  PROT 7.3  ALBUMIN 3.6   No results found for this basename: LIPASE:5,AMYLASE:5 in the last 168 hours No results found for this basename: AMMONIA:5 in the last 168 hours CBC:  Lab 01/26/12 0710 01/25/12 0630 01/23/12 1933 01/23/12 1008  WBC 5.7 5.0 8.4 8.8  NEUTROABS -- -- -- 7.3  HGB 11.2* 10.7* 11.9* 12.6*  HCT 34.1* 31.6* 35.6* 37.3*  MCV 91.9 93.5 91.3 92.6  PLT 133* 127* 162 172   Cardiac Enzymes:  Lab 01/24/12 0720 01/24/12 0125 01/23/12 1933  CKTOTAL -- -- --  CKMB -- -- --  CKMBINDEX -- -- --  TROPONINI <0.30 <0.30 <0.30   BNP: BNP (last 3 results) No results found for this basename: PROBNP:3 in the last 8760 hours CBG:  Lab 01/27/12 0654 01/26/12 2138 01/26/12 1631 01/26/12 1145 01/26/12 1054  GLUCAP 116* 145* 177* 342* 361*    Time coordinating discharge: 45 minutes  Signed:  Solei Wubben  [facility] 01/27/2012, 8:04 AM

## 2012-06-01 ENCOUNTER — Emergency Department (HOSPITAL_COMMUNITY): Payer: PRIVATE HEALTH INSURANCE

## 2012-06-01 ENCOUNTER — Emergency Department (HOSPITAL_COMMUNITY)
Admission: EM | Admit: 2012-06-01 | Discharge: 2012-06-01 | Disposition: A | Discharge status: Home / Self Care | Payer: PRIVATE HEALTH INSURANCE | Attending: Emergency Medicine | Admitting: Emergency Medicine

## 2012-06-01 ENCOUNTER — Encounter (HOSPITAL_COMMUNITY): Payer: Self-pay

## 2012-06-01 DIAGNOSIS — Z7982 Long term (current) use of aspirin: Secondary | ICD-10-CM | POA: Insufficient documentation

## 2012-06-01 DIAGNOSIS — M6281 Muscle weakness (generalized): Secondary | ICD-10-CM | POA: Insufficient documentation

## 2012-06-01 DIAGNOSIS — Z79899 Other long term (current) drug therapy: Secondary | ICD-10-CM | POA: Insufficient documentation

## 2012-06-01 DIAGNOSIS — E119 Type 2 diabetes mellitus without complications: Secondary | ICD-10-CM | POA: Insufficient documentation

## 2012-06-01 DIAGNOSIS — R259 Unspecified abnormal involuntary movements: Secondary | ICD-10-CM | POA: Insufficient documentation

## 2012-06-01 DIAGNOSIS — Z794 Long term (current) use of insulin: Secondary | ICD-10-CM | POA: Insufficient documentation

## 2012-06-01 DIAGNOSIS — R112 Nausea with vomiting, unspecified: Secondary | ICD-10-CM | POA: Insufficient documentation

## 2012-06-01 DIAGNOSIS — I10 Essential (primary) hypertension: Secondary | ICD-10-CM | POA: Insufficient documentation

## 2012-06-01 DIAGNOSIS — H5789 Other specified disorders of eye and adnexa: Secondary | ICD-10-CM | POA: Insufficient documentation

## 2012-06-01 DIAGNOSIS — I69920 Aphasia following unspecified cerebrovascular disease: Secondary | ICD-10-CM | POA: Insufficient documentation

## 2012-06-01 DIAGNOSIS — I69998 Other sequelae following unspecified cerebrovascular disease: Secondary | ICD-10-CM | POA: Insufficient documentation

## 2012-06-01 DIAGNOSIS — R1110 Vomiting, unspecified: Secondary | ICD-10-CM

## 2012-06-01 LAB — CBC WITH DIFFERENTIAL/PLATELET
Basophils Relative: 0 % (ref 0–1)
Eosinophils Absolute: 0 10*3/uL (ref 0.0–0.7)
MCH: 31 pg (ref 26.0–34.0)
MCHC: 33.2 g/dL (ref 30.0–36.0)
Neutro Abs: 5.3 10*3/uL (ref 1.7–7.7)
Neutrophils Relative %: 82 % — ABNORMAL HIGH (ref 43–77)
Platelets: 175 10*3/uL (ref 150–400)
RBC: 4.19 MIL/uL — ABNORMAL LOW (ref 4.22–5.81)

## 2012-06-01 LAB — BASIC METABOLIC PANEL
GFR calc Af Amer: 54 mL/min — ABNORMAL LOW (ref >90)
GFR calc non Af Amer: 46 mL/min — ABNORMAL LOW (ref >90)
Potassium: 4.7 mEq/L (ref 3.5–5.1)
Sodium: 148 mEq/L — ABNORMAL HIGH (ref 135–145)

## 2012-06-01 MED ORDER — ONDANSETRON HCL 4 MG PO TABS: 4.0000 mg | ORAL_TABLET | Freq: Three times a day (TID) | ORAL | Status: AC | PRN

## 2012-06-01 MED ORDER — ERYTHROMYCIN 5 MG/GM OP OINT: TOPICAL_OINTMENT | OPHTHALMIC | Status: DC

## 2012-06-01 NOTE — ED Notes (Signed)
Pt states he awoke vomiting today. Denies any abd pain. States he vomited 2x. NAD noted.

## 2012-06-01 NOTE — ED Notes (Signed)
Pt able to tolerate oral fluids

## 2012-06-01 NOTE — ED Provider Notes (Signed)
History     CSN: [id number]  Arrival date & time 06/01/12  1141   First MD Initiated Contact with Patient 06/01/12 1149      Chief Complaint  Patient presents with  . Emesis    (Consider location/radiation/quality/duration/timing/severity/associated sxs/prior treatment) HPI Comments: This is a 67 year old male, who presents emergency department with chief complaint of nausea, vomiting, and tremors which started this morning. Patient called EMS because of the nausea and vomiting. Reportedly, patient was unable to take his regular medications. His blood pressure was high when the EMS arrived, 188/122. Patient did not have any vomiting while with EMS, he has not had any vomiting while in the emergency department thus far. He has not tried taking anything to alleviate his symptoms. Currently he denies fever, chest pain, shortness of breath, nausea and vomiting, diarrhea & constipation, new numbness and tingling of the extremities. Patient has prior history of CVA, and has persistent right-sided weakness at baseline, as well as expressive aphasia at baseline. He denies pain in general.  The history is provided by the patient. No language interpreter was used.    Past Medical History  Diagnosis Date  . Hypertension   . Diabetes mellitus   . CVA (cerebral vascular accident)   . Stroke     History reviewed. No pertinent past surgical history.  History reviewed. No pertinent family history.  History  Substance Use Topics  . Smoking status: Never Smoker   . Smokeless tobacco: Not on file  . Alcohol Use: No      Review of Systems  All other systems reviewed and are negative.    Allergies  Review of patient's allergies indicates no known allergies.  Home Medications   Current Outpatient Rx  Name  Route  Sig  Dispense  Refill  . amLODipine (NORVASC) 10 MG tablet   Oral   Take 10 mg by mouth daily.         Marland Kitchen aspirin 325 MG tablet   Oral   Take 325 mg by mouth daily.          . cloNIDine (CATAPRES) 0.3 MG tablet   Oral   Take 0.3 mg by mouth every 8 (eight) hours.         Marland Kitchen donepezil (ARICEPT) 10 MG tablet   Oral   Take 10 mg by mouth daily.         . furosemide (LASIX) 20 MG tablet   Oral   Take 20 mg by mouth every Monday, Wednesday, and Friday.         . insulin glargine (LANTUS) 100 UNIT/ML injection   Subcutaneous   Inject 30 Units into the skin at bedtime.   10 mL      . levETIRAcetam (KEPPRA) 500 MG tablet   Oral   Take 1 tablet (500 mg total) by mouth 2 (two) times daily.   60 tablet   0   . losartan (COZAAR) 100 MG tablet   Oral   Take 0.5 tablets (50 mg total) by mouth daily.         . metFORMIN (GLUCOPHAGE) 500 MG tablet   Oral   Take 500 mg by mouth 2 (two) times daily with a meal.         . metoprolol (LOPRESSOR) 100 MG tablet   Oral   Take 100 mg by mouth 2 (two) times daily.         . pioglitazone (ACTOS) 30 MG tablet   Oral   Take  30 mg by mouth daily.         . simvastatin (ZOCOR) 20 MG tablet   Oral   Take 20 mg by mouth every evening.           BP 167/94  Pulse 86  Temp(Src) 98.7 F (37.1 C) (Oral)  Resp 20  SpO2 98%  Physical Exam  Nursing note and vitals reviewed. Constitutional: He is oriented to person, place, and time. He appears well-developed and well-nourished.  HENT:  Head: Normocephalic and atraumatic.  Nose: Nose normal.  Mouth/Throat: Oropharynx is clear and moist. No oropharyngeal exudate.  Dry mucous membranes,   Eyes: Conjunctivae and EOM are normal. Pupils are equal, round, and reactive to light. Right eye exhibits no discharge. Left eye exhibits no discharge. No scleral icterus.  Left eye is mildly inflamed and has mild discharge, no visual complaints  Neck: Normal range of motion. Neck supple. No JVD present.  Cardiovascular: Normal rate, regular rhythm, normal heart sounds and intact distal pulses.  Exam reveals no gallop and no friction rub.   No murmur  heard. Pulmonary/Chest: Effort normal and breath sounds normal. No respiratory distress. He has no wheezes. He has no rales. He exhibits no tenderness.  Abdominal: Soft. Bowel sounds are normal. He exhibits no distension and no mass. There is no tenderness. There is no rebound and no guarding.  Musculoskeletal: Normal range of motion. He exhibits no edema and no tenderness.  Neurological: He is alert and oriented to person, place, and time. He has normal reflexes.  Right-sided weakness is observed, which appears to be at his baseline, as well as expressive aphasia also likely at baseline.  Skin: Skin is warm and dry.  Psychiatric: He has a normal mood and affect. His behavior is normal. Judgment and thought content normal.    ED Course  Procedures (including critical care time)  Labs Reviewed  CBC WITH DIFFERENTIAL  BASIC METABOLIC PANEL   Results for orders placed during the hospital encounter of 06/01/12  CBC WITH DIFFERENTIAL      Result Value Range   WBC 6.5  4.0 - 10.5 K/uL   RBC 4.19 (*) 4.22 - 5.81 MIL/uL   Hemoglobin 13.0  13.0 - 17.0 g/dL   HCT 16.1  09.6 - 04.5 %   MCV 93.6  78.0 - 100.0 fL   MCH 31.0  26.0 - 34.0 pg   MCHC 33.2  30.0 - 36.0 g/dL   RDW 40.9  81.1 - 91.4 %   Platelets 175  150 - 400 K/uL   Neutrophils Relative 82 (*) 43 - 77 %   Neutro Abs 5.3  1.7 - 7.7 K/uL   Lymphocytes Relative 11 (*) 12 - 46 %   Lymphs Abs 0.7  0.7 - 4.0 K/uL   Monocytes Relative 7  3 - 12 %   Monocytes Absolute 0.4  0.1 - 1.0 K/uL   Eosinophils Relative 0  0 - 5 %   Eosinophils Absolute 0.0  0.0 - 0.7 K/uL   Basophils Relative 0  0 - 1 %   Basophils Absolute 0.0  0.0 - 0.1 K/uL  BASIC METABOLIC PANEL      Result Value Range   Sodium 148 (*) 135 - 145 mEq/L   Potassium 4.7  3.5 - 5.1 mEq/L   Chloride 111  96 - 112 mEq/L   CO2 24  19 - 32 mEq/L   Glucose, Bld 184 (*) 70 - 99 mg/dL   BUN 19  6 - 23 mg/dL   Creatinine, Ser 1.61 (*) 0.50 - 1.35 mg/dL   Calcium 9.4  8.4 - 09.6  mg/dL   GFR calc non Af Amer 46 (*) >90 mL/min   GFR calc Af Amer 54 (*) >90 mL/min   Ct Head Wo Contrast  06/01/2012  *RADIOLOGY REPORT*  Clinical Data: Weakness and altered mental status.  CT HEAD WITHOUT CONTRAST  Technique:  Contiguous axial images were obtained from the base of the skull through the vertex without contrast.  Comparison: None  Findings: Generalized cerebral volume loss and moderate chronic small vessel white matter ischemic changes noted.  Remote infarcts in the left MCA region and right basal ganglia noted.  No acute intracranial abnormalities are identified, including mass lesion or mass effect, hydrocephalus, extra-axial fluid collection, midline shift, hemorrhage, or acute infarction.  The visualized bony calvarium is unremarkable.  IMPRESSION: No evidence of acute intracranial abnormality.  Remote left MCA and right basal ganglia infarcts.  Chronic small vessel white matter ischemic changes   Original Report Authenticated By: Harmon Pier, M.D.       1. Vomiting       MDM  67 year old male with prior stroke, now presenting with nausea and vomiting. Discussed patient with Dr. Ranae Palms, who tells me that we should maintain a low threshold for scanning the patient based on his prior history. Total order a CT scan of the patient's head, and obtain basic labs, which if normal, I will likely discharge the patient. Patient has not complained of anything at this time. Additionally, I will fluid challenge the patient.        Roxy Horseman, PA-C 06/01/12 1439

## 2012-06-01 NOTE — ED Provider Notes (Signed)
Medical screening examination/treatment/procedure(s) were performed by non-physician practitioner and as supervising physician I was immediately available for consultation/collaboration.   Loren Racer, MD 06/01/12 [phone]

## 2012-06-01 NOTE — ED Notes (Signed)
Per ems- called to pt house for n/v and tremors since this am. Pt unable to take medications. Pt lives alone at [facility]. Pt has hx of right sided deficits from past cva. BP-188/122 HR-89 NSR R-18 O2-97% on RA CBG-178

## 2012-06-01 NOTE — ED Notes (Signed)
PA at bedside.

## 2012-06-02 ENCOUNTER — Encounter (HOSPITAL_COMMUNITY): Payer: Self-pay | Admitting: *Deleted

## 2012-06-02 ENCOUNTER — Other Ambulatory Visit (HOSPITAL_COMMUNITY): Payer: PRIVATE HEALTH INSURANCE

## 2012-06-02 ENCOUNTER — Inpatient Hospital Stay (HOSPITAL_COMMUNITY)
Admission: EM | Admit: 2012-06-02 | Discharge: 2012-07-16 | DRG: 208 | Disposition: E | Payer: PRIVATE HEALTH INSURANCE | Attending: Pulmonary Disease | Admitting: Pulmonary Disease

## 2012-06-02 ENCOUNTER — Emergency Department (HOSPITAL_COMMUNITY): Payer: PRIVATE HEALTH INSURANCE

## 2012-06-02 DIAGNOSIS — R0600 Dyspnea, unspecified: Secondary | ICD-10-CM

## 2012-06-02 DIAGNOSIS — J690 Pneumonitis due to inhalation of food and vomit: Secondary | ICD-10-CM | POA: Diagnosis not present

## 2012-06-02 DIAGNOSIS — E870 Hyperosmolality and hypernatremia: Secondary | ICD-10-CM | POA: Diagnosis present

## 2012-06-02 DIAGNOSIS — N189 Chronic kidney disease, unspecified: Secondary | ICD-10-CM | POA: Diagnosis present

## 2012-06-02 DIAGNOSIS — J9600 Acute respiratory failure, unspecified whether with hypoxia or hypercapnia: Principal | ICD-10-CM | POA: Diagnosis present

## 2012-06-02 DIAGNOSIS — R1310 Dysphagia, unspecified: Secondary | ICD-10-CM | POA: Diagnosis present

## 2012-06-02 DIAGNOSIS — I672 Cerebral atherosclerosis: Secondary | ICD-10-CM | POA: Diagnosis present

## 2012-06-02 DIAGNOSIS — G40901 Epilepsy, unspecified, not intractable, with status epilepticus: Secondary | ICD-10-CM

## 2012-06-02 DIAGNOSIS — G9341 Metabolic encephalopathy: Secondary | ICD-10-CM | POA: Diagnosis present

## 2012-06-02 DIAGNOSIS — E119 Type 2 diabetes mellitus without complications: Secondary | ICD-10-CM | POA: Diagnosis present

## 2012-06-02 DIAGNOSIS — G40401 Other generalized epilepsy and epileptic syndromes, not intractable, with status epilepticus: Secondary | ICD-10-CM | POA: Diagnosis present

## 2012-06-02 DIAGNOSIS — I639 Cerebral infarction, unspecified: Secondary | ICD-10-CM

## 2012-06-02 DIAGNOSIS — I129 Hypertensive chronic kidney disease with stage 1 through stage 4 chronic kidney disease, or unspecified chronic kidney disease: Secondary | ICD-10-CM | POA: Diagnosis present

## 2012-06-02 DIAGNOSIS — Z8673 Personal history of transient ischemic attack (TIA), and cerebral infarction without residual deficits: Secondary | ICD-10-CM

## 2012-06-02 DIAGNOSIS — R4182 Altered mental status, unspecified: Secondary | ICD-10-CM

## 2012-06-02 DIAGNOSIS — I10 Essential (primary) hypertension: Secondary | ICD-10-CM | POA: Diagnosis present

## 2012-06-02 DIAGNOSIS — Z66 Do not resuscitate: Secondary | ICD-10-CM | POA: Diagnosis not present

## 2012-06-02 DIAGNOSIS — Z515 Encounter for palliative care: Secondary | ICD-10-CM | POA: Diagnosis not present

## 2012-06-02 DIAGNOSIS — F0150 Vascular dementia without behavioral disturbance: Secondary | ICD-10-CM | POA: Diagnosis present

## 2012-06-02 DIAGNOSIS — R531 Weakness: Secondary | ICD-10-CM

## 2012-06-02 DIAGNOSIS — N179 Acute kidney failure, unspecified: Secondary | ICD-10-CM | POA: Diagnosis present

## 2012-06-02 LAB — URINALYSIS, ROUTINE W REFLEX MICROSCOPIC
Glucose, UA: 100 mg/dL — AB
Protein, ur: >300 mg/dL — AB
Specific Gravity, Urine: 1.026 (ref 1.005–1.030)
Urobilinogen, UA: 1 mg/dL (ref 0.0–1.0)

## 2012-06-02 LAB — POCT I-STAT 3, ART BLOOD GAS (G3+)
Bicarbonate: 19.6 mEq/L — ABNORMAL LOW (ref 20.0–24.0)
TCO2: 21 mmol/L (ref 0–100)
pH, Arterial: 7.36 (ref 7.350–7.450)
pO2, Arterial: 140 mmHg — ABNORMAL HIGH (ref 80.0–100.0)

## 2012-06-02 LAB — TROPONIN I: Troponin I: <0.3 ng/mL (ref <0.30)

## 2012-06-02 LAB — COMPREHENSIVE METABOLIC PANEL
BUN: 21 mg/dL (ref 6–23)
CO2: 10 mEq/L — CL (ref 19–32)
Calcium: 9.6 mg/dL (ref 8.4–10.5)
Creatinine, Ser: 1.72 mg/dL — ABNORMAL HIGH (ref 0.50–1.35)
GFR calc Af Amer: 46 mL/min — ABNORMAL LOW (ref >90)
GFR calc non Af Amer: 39 mL/min — ABNORMAL LOW (ref >90)
Glucose, Bld: 282 mg/dL — ABNORMAL HIGH (ref 70–99)
Total Protein: 8.1 g/dL (ref 6.0–8.3)

## 2012-06-02 LAB — CBC WITH DIFFERENTIAL/PLATELET
Eosinophils Absolute: 0.1 10*3/uL (ref 0.0–0.7)
Eosinophils Relative: 1 % (ref 0–5)
HCT: 43.1 % (ref 39.0–52.0)
Hemoglobin: 13.2 g/dL (ref 13.0–17.0)
Lymphocytes Relative: 41 % (ref 12–46)
Lymphs Abs: 4.1 10*3/uL — ABNORMAL HIGH (ref 0.7–4.0)
MCH: 30.6 pg (ref 26.0–34.0)
MCV: 100 fL (ref 78.0–100.0)
Monocytes Absolute: 0.7 10*3/uL (ref 0.1–1.0)
Monocytes Relative: 7 % (ref 3–12)
Platelets: 226 10*3/uL (ref 150–400)
RBC: 4.31 MIL/uL (ref 4.22–5.81)

## 2012-06-02 LAB — URINE MICROSCOPIC-ADD ON

## 2012-06-02 LAB — PROTIME-INR: INR: 1.29 (ref 0.00–1.49)

## 2012-06-02 LAB — GLUCOSE, CAPILLARY: Glucose-Capillary: 122 mg/dL — ABNORMAL HIGH (ref 70–99)

## 2012-06-02 LAB — CK TOTAL AND CKMB (NOT AT ARMC)
CK, MB: 5.7 ng/mL — ABNORMAL HIGH (ref 0.3–4.0)
Relative Index: 1.7 (ref 0.0–2.5)
Total CK: 340 U/L — ABNORMAL HIGH (ref 7–232)

## 2012-06-02 LAB — APTT: aPTT: 33 seconds (ref 24–37)

## 2012-06-02 MED ORDER — SODIUM CHLORIDE 0.9 % IV SOLN
1000.0000 mg | Freq: Once | INTRAVENOUS | Status: DC
  Filled 2012-06-02: qty 10

## 2012-06-02 MED ORDER — SODIUM CHLORIDE 0.9 % IV SOLN: INTRAVENOUS | Status: DC

## 2012-06-02 MED ORDER — SODIUM CHLORIDE 0.9 % IV SOLN
1000.0000 mg | Freq: Two times a day (BID) | INTRAVENOUS | Status: DC
  Administered 2012-06-03 – 2012-06-04 (×3): 1000 mg via INTRAVENOUS
  Filled 2012-06-02 (×4): qty 10

## 2012-06-02 MED ORDER — BIOTENE DRY MOUTH MT LIQD
1.0000 "application " | Freq: Four times a day (QID) | OROMUCOSAL | Status: DC
  Administered 2012-06-03 – 2012-06-16 (×46): 15 mL via OROMUCOSAL

## 2012-06-02 MED ORDER — SODIUM CHLORIDE 0.9 % IV SOLN
1000.0000 mg | Freq: Once | INTRAVENOUS | Status: AC
  Administered 2012-06-02: 1000 mg via INTRAVENOUS
  Filled 2012-06-02: qty 10

## 2012-06-02 MED ORDER — SODIUM CHLORIDE 0.9 % IV BOLUS (SEPSIS)
1000.0000 mL | Freq: Once | INTRAVENOUS | Status: AC
  Administered 2012-06-02: 1000 mL via INTRAVENOUS

## 2012-06-02 MED ORDER — SODIUM CHLORIDE 0.9 % IV SOLN
1000.0000 mg | Freq: Two times a day (BID) | INTRAVENOUS | Status: DC
  Filled 2012-06-02: qty 10

## 2012-06-02 MED ORDER — ACETAMINOPHEN 160 MG/5ML PO SOLN
650.0000 mg | Freq: Four times a day (QID) | ORAL | Status: DC | PRN
  Administered 2012-06-02 – 2012-06-21 (×8): 650 mg
  Filled 2012-06-02 (×9): qty 20.3

## 2012-06-02 MED ORDER — PANTOPRAZOLE SODIUM 40 MG IV SOLR
40.0000 mg | Freq: Two times a day (BID) | INTRAVENOUS | Status: DC
  Administered 2012-06-03: 40 mg via INTRAVENOUS
  Filled 2012-06-02 (×2): qty 40

## 2012-06-02 MED ORDER — LABETALOL HCL 5 MG/ML IV SOLN
10.0000 mg | INTRAVENOUS | Status: DC | PRN
  Administered 2012-06-02 – 2012-06-06 (×3): 10 mg via INTRAVENOUS
  Administered 2012-06-06: 14:00:00 via INTRAVENOUS
  Administered 2012-06-06 – 2012-06-21 (×19): 10 mg via INTRAVENOUS
  Filled 2012-06-02 (×22): qty 4

## 2012-06-02 MED ORDER — SODIUM CHLORIDE 0.9 % IV SOLN
1700.0000 mg | INTRAVENOUS | Status: AC
  Administered 2012-06-02: 1700 mg via INTRAVENOUS
  Filled 2012-06-02: qty 34

## 2012-06-02 MED ORDER — CHLORHEXIDINE GLUCONATE 0.12 % MT SOLN
15.0000 mL | Freq: Two times a day (BID) | OROMUCOSAL | Status: DC
  Filled 2012-06-02 (×2): qty 15

## 2012-06-02 MED ORDER — SODIUM CHLORIDE 0.9 % IV SOLN: 20.0000 mg/kg | Freq: Once | INTRAVENOUS | Status: DC

## 2012-06-02 MED ORDER — SODIUM CHLORIDE 0.9 % IV SOLN: 500.0000 mg | Freq: Two times a day (BID) | INTRAVENOUS | Status: DC

## 2012-06-02 MED ORDER — MIDAZOLAM HCL 2 MG/2ML IJ SOLN
2.0000 mg | Freq: Once | INTRAMUSCULAR | Status: AC
  Administered 2012-06-02: 2 mg via INTRAVENOUS
  Filled 2012-06-02: qty 2

## 2012-06-02 MED ORDER — CHLORHEXIDINE GLUCONATE 0.12 % MT SOLN
15.0000 mL | Freq: Two times a day (BID) | OROMUCOSAL | Status: DC
  Administered 2012-06-02 – 2012-06-15 (×25): 15 mL via OROMUCOSAL
  Filled 2012-06-02 (×28): qty 15

## 2012-06-02 MED ORDER — INSULIN ASPART 100 UNIT/ML ~~LOC~~ SOLN: 2.0000 [IU] | SUBCUTANEOUS | Status: DC

## 2012-06-02 MED ORDER — SODIUM CHLORIDE 0.45 % IV SOLN
INTRAVENOUS | Status: DC
  Administered 2012-06-02: 100 mL/h via INTRAVENOUS
  Administered 2012-06-02 – 2012-06-05 (×3): via INTRAVENOUS

## 2012-06-02 MED ORDER — PANTOPRAZOLE SODIUM 40 MG IV SOLR
40.0000 mg | INTRAVENOUS | Status: DC
  Administered 2012-06-02: 40 mg via INTRAVENOUS

## 2012-06-02 MED ORDER — HEPARIN SODIUM (PORCINE) 5000 UNIT/ML IJ SOLN: 5000.0000 [IU] | Freq: Three times a day (TID) | INTRAMUSCULAR | Status: DC

## 2012-06-02 MED ORDER — PROPOFOL 10 MG/ML IV EMUL
5.0000 ug/kg/min | INTRAVENOUS | Status: DC
  Administered 2012-06-02: 5.353 ug/kg/min via INTRAVENOUS
  Administered 2012-06-03 (×2): 20 ug/kg/min via INTRAVENOUS
  Administered 2012-06-03: 10 ug/kg/min via INTRAVENOUS
  Administered 2012-06-04: 20 ug/kg/min via INTRAVENOUS
  Administered 2012-06-04 (×2): 25 ug/kg/min via INTRAVENOUS
  Administered 2012-06-04 – 2012-06-05 (×3): 40 ug/kg/min via INTRAVENOUS
  Filled 2012-06-02 (×10): qty 100

## 2012-06-02 MED ORDER — INSULIN ASPART 100 UNIT/ML ~~LOC~~ SOLN
0.0000 [IU] | SUBCUTANEOUS | Status: DC
  Administered 2012-06-02 – 2012-06-05 (×7): 3 [IU] via SUBCUTANEOUS
  Administered 2012-06-05: 4 [IU] via SUBCUTANEOUS
  Administered 2012-06-07 – 2012-06-08 (×5): 3 [IU] via SUBCUTANEOUS
  Administered 2012-06-09: 4 [IU] via SUBCUTANEOUS
  Administered 2012-06-09: 3 [IU] via SUBCUTANEOUS

## 2012-06-02 MED ORDER — SODIUM CHLORIDE 0.9 % IV SOLN
1.0000 mg/h | INTRAVENOUS | Status: DC
  Administered 2012-06-02: 1 mg/h via INTRAVENOUS
  Filled 2012-06-02: qty 10

## 2012-06-02 MED ORDER — SODIUM CHLORIDE 0.9 % IV SOLN
1000.0000 mg | Freq: Two times a day (BID) | INTRAVENOUS | Status: DC
  Filled 2012-06-02 (×2): qty 10

## 2012-06-02 NOTE — ED Notes (Signed)
Pt was LSN 0900 by family and ems found patient unresponsive.  Ems reported that patient originally appeared to be having focal seizure and original bp 200/120  cbg 225.  En route patient had grandmal seizure lasting 2 minutes and no history of seizures.  Post seizure patient had left sided gaze and no gag.  Pt was intubated with 8.0 ETT by ems.  Pt had a second grandmal seizure and was given Versed 3mg  IV by ems and patient continues to still show symptoms of possible seizing with eyes blinking.  Bp decreased to 179/115 and HR 140.  Pt is breathing on his own with assisted ventilations via ETT.  Pt was seen at our facilty yesterday and sent home.

## 2012-06-02 NOTE — Progress Notes (Signed)
Pt came to ED intubated by EMS with an 8.0 ET Tube taped at 26 @ Lip clear BBS SATS 100%, HR 156, RR 22 placed on vent on above settings.

## 2012-06-02 NOTE — ED Notes (Signed)
Returned to Trauma C with pt from CT.  Family at bedside.  Updated family about pt's progress and explained procedures being done.

## 2012-06-02 NOTE — ED Notes (Signed)
Pt to CT with RN and RT 

## 2012-06-02 NOTE — ED Provider Notes (Addendum)
History     CSN: [id number]  Arrival date & time 05/21/2012  1144   First MD Initiated Contact with Patient 05/30/2012 1150      Chief Complaint  Patient presents with  . Unresponsive   . Seizures    (Consider location/radiation/quality/duration/timing/severity/associated sxs/prior treatment) HPI Comments: Patient brought to the ER by ambulance for evaluation of seizure. Patient was last seen normal at 9 AM today. Family found him unresponsive. EMS reported that he had symmetrical movement of the eyes at might of been a focal seizure with extreme hypertension. He then had generalized tonic-clonic seizure and became completely unresponsive. He had no gag reflex seemed to be having a leftward gaze and was intubated without difficulty. Patient had a second generalized tonic-clonic seizure was given Versed 3 mg IV. No further generalized seizure type movement, but is still experiencing some focal movements of the eyes. Blood pressure now improved after the Versed.  Patient is a 67 y.o. male presenting with seizures.  Seizures   Past Medical History  Diagnosis Date  . Hypertension   . Diabetes mellitus   . CVA (cerebral vascular accident)   . Stroke     No past surgical history on file.  No family history on file.  History  Substance Use Topics  . Smoking status: Never Smoker   . Smokeless tobacco: Not on file  . Alcohol Use: No      Review of Systems  Unable to perform ROS Neurological: Positive for seizures.    Allergies  Review of patient's allergies indicates no known allergies.  Home Medications   Current Outpatient Rx  Name  Route  Sig  Dispense  Refill  . allopurinol (ZYLOPRIM) 100 MG tablet   Oral   Take 100 mg by mouth 3 (three) times a week. Take on Monday, Wednesday, Friday         . amLODipine (NORVASC) 10 MG tablet   Oral   Take 10 mg by mouth daily.         Marland Kitchen aspirin 325 MG tablet   Oral   Take 325 mg by mouth daily.         . cloNIDine  (CATAPRES) 0.3 MG tablet   Oral   Take 0.3 mg by mouth every 8 (eight) hours.         Marland Kitchen donepezil (ARICEPT) 10 MG tablet   Oral   Take 10 mg by mouth daily.         Marland Kitchen erythromycin ophthalmic ointment      Place a 1/2 inch ribbon of ointment into the lower eyelid.   1 g   0   . furosemide (LASIX) 20 MG tablet   Oral   Take 20 mg by mouth every Monday, Wednesday, and Friday.         . insulin glargine (LANTUS) 100 UNIT/ML injection   Subcutaneous   Inject 30 Units into the skin at bedtime.   10 mL      . levETIRAcetam (KEPPRA) 500 MG tablet   Oral   Take 1 tablet (500 mg total) by mouth 2 (two) times daily.   60 tablet   0   . losartan (COZAAR) 100 MG tablet   Oral   Take 0.5 tablets (50 mg total) by mouth daily.         . metFORMIN (GLUCOPHAGE) 500 MG tablet   Oral   Take 500 mg by mouth 2 (two) times daily with a meal.         .  metoprolol (LOPRESSOR) 100 MG tablet   Oral   Take 100 mg by mouth 2 (two) times daily.         . ondansetron (ZOFRAN) 4 MG tablet   Oral   Take 1 tablet (4 mg total) by mouth every 8 (eight) hours as needed for nausea.   10 tablet   0   . pioglitazone (ACTOS) 30 MG tablet   Oral   Take 30 mg by mouth daily.         . simvastatin (ZOCOR) 20 MG tablet   Oral   Take 20 mg by mouth every evening.           BP 175/87  Pulse 158  Temp(Src) 99.5 F (37.5 C) (Rectal)  Resp 22  SpO2 100%  Physical Exam  Constitutional: He appears well-developed and well-nourished.  HENT:  Head: Normocephalic and atraumatic.  Eyes: Right eye exhibits abnormal extraocular motion. Left eye exhibits abnormal extraocular motion.  Persistent leftward gaze Pupils 1mm, non-reactive  Neck: Neck supple.  Cardiovascular: Regular rhythm, S1 normal and S2 normal.  Tachycardia present.   Pulmonary/Chest:  Coarse breath sounds, equal bilateral - intubated and bagged  Abdominal: Soft.  Musculoskeletal: He exhibits no edema.  Neurological:  He is unresponsive. A cranial nerve deficit is present.  Skin: Skin is warm and dry.    ED Course  Procedures (including critical care time)   Date: 05/28/2012  Rate: 154  Rhythm: sinus tachycardia  QRS Axis: normal  Intervals: normal  ST/T Wave abnormalities: nonspecific ST/T changes  Conduction Disutrbances:none  Narrative Interpretation:   Old EKG Reviewed: now tachycardia, o/w no sig change    Labs Reviewed  CBC WITH DIFFERENTIAL - Abnormal; Notable for the following:    Lymphs Abs 4.1 (*)    All other components within normal limits  COMPREHENSIVE METABOLIC PANEL - Abnormal; Notable for the following:    Sodium 151 (*)    CO2 10 (*)    Glucose, Bld 282 (*)    Creatinine, Ser 1.72 (*)    GFR calc non Af Amer 39 (*)    GFR calc Af Amer 46 (*)    All other components within normal limits  PROTIME-INR - Abnormal; Notable for the following:    Prothrombin Time 15.8 (*)    All other components within normal limits  LACTIC ACID, PLASMA - Abnormal; Notable for the following:    Lactic Acid, Venous 21.2 (*)    All other components within normal limits  URINALYSIS, ROUTINE W REFLEX MICROSCOPIC - Abnormal; Notable for the following:    Color, Urine AMBER (*)    APPearance TURBID (*)    Glucose, UA 100 (*)    Hgb urine dipstick LARGE (*)    Bilirubin Urine MODERATE (*)    Ketones, ur 15 (*)    Protein, ur >300 (*)    Leukocytes, UA TRACE (*)    All other components within normal limits  URINE MICROSCOPIC-ADD ON - Abnormal; Notable for the following:    Casts HYALINE CASTS (*)    All other components within normal limits  POCT I-STAT 3, BLOOD GAS (G3+) - Abnormal; Notable for the following:    pCO2 arterial 34.6 (*)    pO2, Arterial 140.0 (*)    Bicarbonate 19.6 (*)    Acid-base deficit 5.0 (*)    All other components within normal limits  URINE CULTURE  TROPONIN I  APTT  CK TOTAL AND CKMB   Ct Head Wo Contrast  05/19/2012  *RADIOLOGY REPORT*  Clinical Data:  Seizure and altered mental status.  CT HEAD WITHOUT CONTRAST  Technique:  Contiguous axial images were obtained from the base of the skull through the vertex without contrast.  Comparison: 06/01/2012  Findings: Atrophy, chronic small vessel white matter ischemic changes again noted. Remote left MCA and right basal ganglia infarcts again noted. No acute intracranial abnormalities are identified, including mass lesion or mass effect, hydrocephalus, extra-axial fluid collection, midline shift, hemorrhage, or acute infarction.  The visualized bony calvarium is unremarkable.  IMPRESSION: No evidence of acute intracranial abnormality.  Atrophy, chronic small vessel white matter ischemic changes and remote left MCA and right basal ganglia infarcts.   Original Report Authenticated By: Harmon Pier, M.D.    Ct Head Wo Contrast  06/01/2012  *RADIOLOGY REPORT*  Clinical Data: Weakness and altered mental status.  CT HEAD WITHOUT CONTRAST  Technique:  Contiguous axial images were obtained from the base of the skull through the vertex without contrast.  Comparison: None  Findings: Generalized cerebral volume loss and moderate chronic small vessel white matter ischemic changes noted.  Remote infarcts in the left MCA region and right basal ganglia noted.  No acute intracranial abnormalities are identified, including mass lesion or mass effect, hydrocephalus, extra-axial fluid collection, midline shift, hemorrhage, or acute infarction.  The visualized bony calvarium is unremarkable.  IMPRESSION: No evidence of acute intracranial abnormality.  Remote left MCA and right basal ganglia infarcts.  Chronic small vessel white matter ischemic changes   Original Report Authenticated By: Harmon Pier, M.D.    Dg Chest Port 1 View  05/20/2012  *RADIOLOGY REPORT*  Clinical Data: Found unresponsive.  Intubation.  PORTABLE CHEST - 1 VIEW  Comparison: None.  Findings: Endotracheal tube tip is 3-4 cm above the carina in expected position.  NG  tube enters the stomach.  Minimal left base density, likely lingular scarring.  Right lung is clear.  Heart is normal size.  Blunting of the left costophrenic angle could reflect scarring or small left effusion.  IMPRESSION: Suspect lingular scarring.  Small left effusion versus pleural thickening/scar.  Endotracheal tube 3-4 cm above the carina.   Original Report Authenticated By: Charlett Nose, M.D.      Diagnosis: 1. Status Epillepticus 2. Lactic Acidosis    MDM  Patient presents to the ER after 2 generalized tonic clonic seizures requiring intubation by EMS. Patient was extremely hypertensive both prior to arrival and upon arrival. Patient reportedly has never had a seizure before. He has a history of hypertension diabetes and stroke. Review of the patient's records, however, do show that he is on Keppra. It's not clear why at this time.  Patient was given additional Versed at arrival because he was having some rhythmic movements of his eyes it could be focal seizure activity. He was then loaded with fosphenytoin and placed on Versed drip. Patient's vital signs have significantly improved. He became normotensive and his significant tachycardia has resolved.  Laboratory workup reveals grossly normal findings other than slight renal insufficiency and hypernatremia. He does, however, have a significant lactic acidosis. This is presumably from the seizures, no signs of sepsis at this time. CPK pending at this time to rule out rhabdomyolysis.  Patient will require hospitalization in the intensive care unit. Discussed with critical care, Dr. De Burrs, will evaluate patient in the ER.  CRITICAL CARE Performed by: Gilda Crease   Total critical care time: 30  Critical care time was exclusive of separately billable procedures and treating  other patients.  Critical care was necessary to treat or prevent imminent or life-threatening deterioration.  Critical care was time spent  personally by me on the following activities: development of treatment plan with patient and/or surrogate as well as nursing, discussions with consultants, evaluation of patient's response to treatment, examination of patient, obtaining history from patient or surrogate, ordering and performing treatments and interventions, ordering and review of laboratory studies, ordering and review of radiographic studies, pulse oximetry and re-evaluation of patient's condition.  Addendum: After the patient was admitted by critical care, the patient's nurse came to me and told me that the patient has now spiked a fever. I did briefly discuss this with critical care. They were aware of the fever and her covering it with antibiotics. They did not feel that I needed to perform a lumbar puncture in the patient at this time.      Gilda Crease, MD 06-10-12 1540  Gilda Crease, MD 06/10/12 1623

## 2012-06-02 NOTE — ED Notes (Signed)
Pt clothing in belongings bag and sent to floor.  Pt had on a necklace that was removed from neck and placed on left wrist with tape securing it.

## 2012-06-02 NOTE — H&P (Signed)
PULMONARY  / CRITICAL CARE MEDICINE  Name: Robert Mcclure MRN: [id number] DOB: 05-11-1945    ADMISSION DATE:  05/30/2012 CONSULTATION DATE: 05/18/2012  REFERRING MD :  EDP  CHIEF COMPLAINT:  Resp Failure/Seizure   BRIEF PATIENT DESCRIPTION:  67 yo with hx of  CVA, vascular dementia, seizure disorder  and HTN brought to ER 2/16 status epilepticus intubated for airway protection . PCCM asked to admit .  2 day hx of n/v prior to admit .   SIGNIFICANT EVENTS / STUDIES:  CT head 2/16 no acute process   LINES / TUBES: 2/16 ETT   CULTURES: 2/16 LP >> 2/16 BC x 2 > 2/16 UC >   ANTIBIOTICS:   HISTORY OF PRESENT ILLNESS:   Patient brought to the ER by ambulance for evaluation of seizure. Patient was last seen normal at 9 AM today. Family found him unresponsive. EMS reported that he had symmetrical movement of the eyes at might of been a focal seizure with extreme hypertension. He then had generalized tonic-clonic seizure and became completely unresponsive. He had no gag reflex seemed to be having a leftward gaze and was intubated without difficulty. Patient had a second generalized tonic-clonic seizure was given Versed 3 mg IV. No further generalized seizure type movement, but is still experiencing some focal movements of the eyes. Blood pressure now improved after the Versed.   Hx of CVA in past  New onset seizure 10/13 started on Keppra    PAST MEDICAL HISTORY :  Past Medical History  Diagnosis Date  . Hypertension   . Diabetes mellitus   . CVA (cerebral vascular accident)   . Stroke    No past surgical history on file. Prior to Admission medications   Medication Sig Start Date End Date Taking? Authorizing Provider  allopurinol (ZYLOPRIM) 100 MG tablet Take 100 mg by mouth 3 (three) times a week. Take on Monday, Wednesday, Friday   Yes Historical Provider, MD  amLODipine (NORVASC) 10 MG tablet Take 10 mg by mouth daily.   Yes Historical Provider, MD  aspirin 325 MG tablet  Take 325 mg by mouth daily.   Yes Historical Provider, MD  cloNIDine (CATAPRES) 0.3 MG tablet Take 0.3 mg by mouth every 8 (eight) hours.   Yes Historical Provider, MD  donepezil (ARICEPT) 10 MG tablet Take 10 mg by mouth daily.   Yes Historical Provider, MD  furosemide (LASIX) 20 MG tablet Take 20 mg by mouth every Monday, Wednesday, and Friday.   Yes Historical Provider, MD  insulin glargine (LANTUS) 100 UNIT/ML injection Inject 30 Units into the skin at bedtime. 01/27/12  Yes Sorin Luanne Bras, MD  levETIRAcetam (KEPPRA) 500 MG tablet Take 1 tablet (500 mg total) by mouth 2 (two) times daily. 01/27/12  Yes Sorin Luanne Bras, MD  losartan (COZAAR) 100 MG tablet Take 0.5 tablets (50 mg total) by mouth daily. 01/27/12  Yes Sorin Luanne Bras, MD  metFORMIN (GLUCOPHAGE) 500 MG tablet Take 500 mg by mouth 2 (two) times daily with a meal.   Yes Historical Provider, MD  metoprolol (LOPRESSOR) 100 MG tablet Take 100 mg by mouth 2 (two) times daily.   Yes Historical Provider, MD  ondansetron (ZOFRAN) 4 MG tablet Take 1 tablet (4 mg total) by mouth every 8 (eight) hours as needed for nausea. 06/01/12  Yes Roxy Horseman, PA-C  pioglitazone (ACTOS) 30 MG tablet Take 30 mg by mouth daily.   Yes Historical Provider, MD  simvastatin (ZOCOR) 20 MG tablet Take 20 mg  by mouth every evening.   Yes Historical Provider, MD   No Known Allergies  FAMILY HISTORY:  No family history on file. SOCIAL HISTORY:  reports that he has never smoked. He does not have any smokeless tobacco history on file. He reports that he does not drink alcohol or use illicit drugs.  REVIEW OF SYSTEMS:  Unable to obtain on vent sedated   SUBJECTIVE:  Sedated on vent   VITAL SIGNS: Temp:  [99.3 F (37.4 C)-102 F (38.9 C)] 102 F (38.9 C) (02/16 1515) Pulse Rate:  [131-158] 143 (02/16 1515) Resp:  [17-22] 21 (02/16 1515) BP: (132-175)/(76-104) 146/104 mmHg (02/16 1515) SpO2:  [98 %-100 %] 99 % (02/16 1515) FiO2 (%):  [40 %] 40 % (02/16  1224) Weight:  [93.441 kg (206 lb)] 93.441 kg (206 lb) (02/16 1247) HEMODYNAMICS:   VENTILATOR SETTINGS: Vent Mode:  [-] PRVC FiO2 (%):  [40 %] 40 % Set Rate:  [14 bmp] 14 bmp Vt Set:  [500 mL] 500 mL PEEP:  [5 cmH20] 5 cmH20 Plateau Pressure:  [7 cmH20] 7 cmH20 INTAKE / OUTPUT: Intake/Output   None     PHYSICAL EXAMINATION: General:  Sedated on vent  Neuro:  Sedated, no seizure act noted, HEENT:  ETT  Cardiovascular:  ST  Lungs:  diminshed BS in bases  Abdomen:  Soft , NT  Musculoskeletal:  Intact  Skin:  No rash   LABS:  Recent Labs Lab 06/01/12 1247 06/12/2012 1152 05/28/2012 1419  HGB 13.0 13.2  --   WBC 6.5 10.1  --   PLT 175 226  --   NA 148* 151*  --   K 4.7 4.4  --   CL 111 107  --   CO2 24 10*  --   GLUCOSE 184* 282*  --   BUN 19 21  --   CREATININE 1.50* 1.72*  --   CALCIUM 9.4 9.6  --   AST  --  33  --   ALT  --  16  --   ALKPHOS  --  58  --   BILITOT  --  0.4  --   PROT  --  8.1  --   ALBUMIN  --  3.8  --   APTT  --  33  --   INR  --  1.29  --   LATICACIDVEN  --  21.2*  --   TROPONINI  --  <0.30  --   PHART  --   --  7.360  PCO2ART  --   --  34.6*  PO2ART  --   --  140.0*   No results found for this basename: GLUCAP,  in the last 168 hours  CXR: Suspect lingular scarring. Small left effusion versus pleural thickening/scar.   ASSESSMENT / PLAN:  PULMONARY A: Respiratory Failure s/p status epilepticus  Intubated for airway protection   P:   Vent support -wean as tolerated.  Sedation protocol  Check xray am  Check abg in   CARDIOVASCULAR A: Tachycardia -card enzymes neg x 1  Hx of HTN  P:  Related to seizure/ dehydration  SCD   RENAL A:  Hx of acute on chronic renal failure  Hypernatremia  P:   Follow bmet  Replace electrolytes as indicated  1/2 NS at 100cc  I/o  Follow UOP   GASTROINTESTINAL A:  N/v ? Etiology  P:    PPI Twice daily   Hemoccult GI emesis ? Hem positive  SCD  D/c hep  HEMATOLOGIC A:   P:   Monitor   INFECTIOUS A:  Fever ? Related to seizure   P:   Hold on LP for now  Tr fever /wbc curve  Check UCx   ENDOCRINE A:  DM    P:   ICU SSI  NPO   NEUROLOGIC A:  Hx of Seizure disorder s/p status Epilepticus  Hx of CVA w/ weakness  >CT head neg for acute  P:   IV keppra  Consider neuro consult  Consider EEG     TODAY'S SUMMARY: 67 yo male with hx DM , CVA and seizure disorder with n/v over last 2 days w/ status epilepticus requiring intubation for airway protection .   I have personally obtained a history, examined the patient, evaluated laboratory and imaging results, formulated the assessment and plan and placed orders. CRITICAL CARE: The patient is critically ill with multiple organ systems failure and requires high complexity decision making for assessment and support, frequent evaluation and titration of therapies, application of advanced monitoring technologies and extensive interpretation of multiple databases. Critical Care Time devoted to patient care services described in this note is  minutes.   PARRETT,TAMMY NP-C  Pulmonary and Critical Care Medicine [facility] Pager: [phone]  06/01/2012, 4:08 PM    I have interviewed and examined the patient and reviewed the database. I have formulated the assessment and plan as reflected in the note above with amendments made by me.   Billy Fischer, MD;  PCCM service; Mobile [phone]

## 2012-06-03 ENCOUNTER — Encounter (HOSPITAL_COMMUNITY): Payer: Self-pay | Admitting: *Deleted

## 2012-06-03 ENCOUNTER — Inpatient Hospital Stay (HOSPITAL_COMMUNITY): Payer: PRIVATE HEALTH INSURANCE

## 2012-06-03 DIAGNOSIS — E119 Type 2 diabetes mellitus without complications: Secondary | ICD-10-CM

## 2012-06-03 DIAGNOSIS — I10 Essential (primary) hypertension: Secondary | ICD-10-CM

## 2012-06-03 DIAGNOSIS — I6350 Cerebral infarction due to unspecified occlusion or stenosis of unspecified cerebral artery: Secondary | ICD-10-CM

## 2012-06-03 DIAGNOSIS — J9600 Acute respiratory failure, unspecified whether with hypoxia or hypercapnia: Principal | ICD-10-CM

## 2012-06-03 LAB — URINE CULTURE
Colony Count: NO GROWTH
Culture: NO GROWTH

## 2012-06-03 LAB — BLOOD GAS, ARTERIAL
O2 Saturation: 98.9 %
PEEP: 5 cmH2O
Patient temperature: 98.6
RATE: 14 resp/min
pCO2 arterial: 31.6 mmHg — ABNORMAL LOW (ref 35.0–45.0)
pO2, Arterial: 128 mmHg — ABNORMAL HIGH (ref 80.0–100.0)

## 2012-06-03 LAB — GLUCOSE, CAPILLARY
Glucose-Capillary: 120 mg/dL — ABNORMAL HIGH (ref 70–99)
Glucose-Capillary: 133 mg/dL — ABNORMAL HIGH (ref 70–99)
Glucose-Capillary: 51 mg/dL — ABNORMAL LOW (ref 70–99)
Glucose-Capillary: 93 mg/dL (ref 70–99)
Glucose-Capillary: 61 mg/dL — ABNORMAL LOW (ref 70–99)
Glucose-Capillary: 125 mg/dL — ABNORMAL HIGH (ref 70–99)

## 2012-06-03 LAB — COMPREHENSIVE METABOLIC PANEL
BUN: 27 mg/dL — ABNORMAL HIGH (ref 6–23)
Calcium: 8.1 mg/dL — ABNORMAL LOW (ref 8.4–10.5)
Creatinine, Ser: 2.31 mg/dL — ABNORMAL HIGH (ref 0.50–1.35)
GFR calc Af Amer: 32 mL/min — ABNORMAL LOW (ref >90)
Glucose, Bld: 135 mg/dL — ABNORMAL HIGH (ref 70–99)
Total Protein: 6.3 g/dL (ref 6.0–8.3)

## 2012-06-03 LAB — SODIUM, URINE, RANDOM: Sodium, Ur: 49 mEq/L

## 2012-06-03 LAB — CBC
HCT: 37.3 % — ABNORMAL LOW (ref 39.0–52.0)
Hemoglobin: 12.4 g/dL — ABNORMAL LOW (ref 13.0–17.0)
MCH: 31.1 pg (ref 26.0–34.0)
MCHC: 33.2 g/dL (ref 30.0–36.0)

## 2012-06-03 LAB — LEGIONELLA ANTIGEN, URINE: Legionella Antigen, Urine: NEGATIVE

## 2012-06-03 LAB — MAGNESIUM
Magnesium: 2 mg/dL (ref 1.5–2.5)
Magnesium: 1.9 mg/dL (ref 1.5–2.5)

## 2012-06-03 LAB — PHOSPHORUS: Phosphorus: 5.1 mg/dL — ABNORMAL HIGH (ref 2.3–4.6)

## 2012-06-03 LAB — BASIC METABOLIC PANEL
CO2: 19 mEq/L (ref 19–32)
Calcium: 8 mg/dL — ABNORMAL LOW (ref 8.4–10.5)
Creatinine, Ser: 2.34 mg/dL — ABNORMAL HIGH (ref 0.50–1.35)
GFR calc non Af Amer: 27 mL/min — ABNORMAL LOW (ref >90)

## 2012-06-03 LAB — STREP PNEUMONIAE URINARY ANTIGEN: Strep Pneumo Urinary Antigen: NEGATIVE

## 2012-06-03 LAB — ALBUMIN: Albumin: 2.9 g/dL — ABNORMAL LOW (ref 3.5–5.2)

## 2012-06-03 MED ORDER — VANCOMYCIN HCL 500 MG IV SOLR
500.0000 mg | Freq: Once | INTRAVENOUS | Status: AC
  Administered 2012-06-03: 500 mg via INTRAVENOUS
  Filled 2012-06-03: qty 500

## 2012-06-03 MED ORDER — PRO-STAT SUGAR FREE PO LIQD
30.0000 mL | Freq: Four times a day (QID) | ORAL | Status: DC
  Administered 2012-06-03 – 2012-06-04 (×7): 30 mL
  Filled 2012-06-03 (×11): qty 30

## 2012-06-03 MED ORDER — PANTOPRAZOLE SODIUM 40 MG PO PACK
40.0000 mg | PACK | Freq: Every day | ORAL | Status: DC
  Administered 2012-06-04: 40 mg
  Filled 2012-06-03 (×3): qty 20

## 2012-06-03 MED ORDER — JEVITY 1.2 CAL PO LIQD
1000.0000 mL | ORAL | Status: DC
  Administered 2012-06-03 – 2012-06-04 (×2)
  Filled 2012-06-03 (×4): qty 1000

## 2012-06-03 MED ORDER — VANCOMYCIN HCL 1000 MG IV SOLR
750.0000 mg | Freq: Two times a day (BID) | INTRAVENOUS | Status: DC
  Filled 2012-06-03: qty 750

## 2012-06-03 MED ORDER — DEXTROSE 50 % IV SOLN
INTRAVENOUS | Status: AC
  Filled 2012-06-03: qty 50

## 2012-06-03 MED ORDER — PIPERACILLIN-TAZOBACTAM 3.375 G IVPB 30 MIN
3.3750 g | Freq: Once | INTRAVENOUS | Status: AC
  Administered 2012-06-03: 3.375 g via INTRAVENOUS
  Filled 2012-06-03: qty 50

## 2012-06-03 MED ORDER — PHENYTOIN SODIUM 50 MG/ML IJ SOLN
50.0000 mg | Freq: Three times a day (TID) | INTRAMUSCULAR | Status: DC
  Administered 2012-06-03 – 2012-06-04 (×2): 50 mg via INTRAVENOUS
  Filled 2012-06-03 (×6): qty 1

## 2012-06-03 MED ORDER — VANCOMYCIN HCL 1000 MG IV SOLR
750.0000 mg | Freq: Once | INTRAVENOUS | Status: AC
  Administered 2012-06-03: 750 mg via INTRAVENOUS
  Filled 2012-06-03: qty 750

## 2012-06-03 MED ORDER — PIPERACILLIN-TAZOBACTAM 3.375 G IVPB
3.3750 g | Freq: Three times a day (TID) | INTRAVENOUS | Status: DC
  Administered 2012-06-03 – 2012-06-06 (×10): 3.375 g via INTRAVENOUS
  Filled 2012-06-03 (×11): qty 50

## 2012-06-03 MED ORDER — HEPARIN SODIUM (PORCINE) 5000 UNIT/ML IJ SOLN
5000.0000 [IU] | Freq: Three times a day (TID) | INTRAMUSCULAR | Status: DC
  Administered 2012-06-03 – 2012-06-10 (×22): 5000 [IU] via SUBCUTANEOUS
  Filled 2012-06-03 (×28): qty 1

## 2012-06-03 MED ORDER — SODIUM CHLORIDE 0.9 % IV SOLN
1000.0000 mg | Freq: Once | INTRAVENOUS | Status: AC
  Administered 2012-06-03: 1000 mg via INTRAVENOUS
  Filled 2012-06-03: qty 20

## 2012-06-03 MED ORDER — VANCOMYCIN HCL 10 G IV SOLR
1250.0000 mg | INTRAVENOUS | Status: DC
  Administered 2012-06-04 – 2012-06-06 (×3): 1250 mg via INTRAVENOUS
  Filled 2012-06-03 (×3): qty 1250

## 2012-06-03 MED ORDER — PHENYTOIN SODIUM 50 MG/ML IJ SOLN
100.0000 mg | Freq: Three times a day (TID) | INTRAMUSCULAR | Status: DC
  Administered 2012-06-03: 100 mg via INTRAVENOUS
  Filled 2012-06-03 (×3): qty 2

## 2012-06-03 MED ORDER — DEXTROSE 50 % IV SOLN
25.0000 mL | Freq: Once | INTRAVENOUS | Status: AC | PRN
  Administered 2012-06-03: 25 mL via INTRAVENOUS

## 2012-06-03 MED ORDER — DEXTROSE 50 % IV SOLN
25.0000 g | Freq: Once | INTRAVENOUS | Status: AC
  Administered 2012-06-03: 12.5 g via INTRAVENOUS

## 2012-06-03 MED ORDER — FOSPHENYTOIN SODIUM 100 MG PE/2ML IJ SOLN
1500.0000 mg | Freq: Once | INTRAMUSCULAR | Status: DC
  Filled 2012-06-03: qty 30

## 2012-06-03 NOTE — Consult Note (Signed)
NEURO HOSPITALIST CONSULT NOTE    Reason for Consult: seizures.  HPI:                                                                                                                                          Robert Mcclure is an 67 y.o. male with a past medical history significant for hypertension, diabetes mellitus, remote left MCA distribution infarct, symptomatic post stroke seizures, and vascular dementia, admitted to the hospital on 2/15 with GTC status epilepticus requiring loading dose fosphenytoin, IV midazolam, propofol and intubation for airway protection. As per chart review, he takes keppra at home but was unable to do so due to recent nausea and vomiting for 2-3 days. He stopped having clinical seizures but today the nursing staff reported intermittent jerking movements of the left side that subsequently generalizes. He is now getting keppra IV 1,000 mg BID and propofol. Clinical course complicated by fever and possible pneumonia.    Past Medical History  Diagnosis Date  . Hypertension   . Diabetes mellitus   . CVA (cerebral vascular accident)   . Stroke     No past surgical history on file.  No family history on file.  Family History:   Social History:  reports that he has never smoked. He does not have any smokeless tobacco history on file. He reports that he does not drink alcohol or use illicit drugs.  No Known Allergies  MEDICATIONS:                                                                                                                     I have reviewed the patient's current medications.   ROS:  Unable to obtain.  History obtained from unobtainable from patient due to mental status   Physical exam: intubated on the vent.Blood pressure 102/67, pulse 116, temperature 100.6 F (38.1 C), temperature source Oral, resp.  rate 14, height 6' (1.829 m), weight 87.4 kg (192 lb 10.9 oz), SpO2 99.00%. Head: normocephalic. Neck: supple, no bruits, no JVD. Cardiac: no murmurs. Lungs: clear. Abdomen: soft, no tender, no mass. Extremities: no edema.  Neurologic Examination:  Patient is on propofol and intubated.                                                               Mental Status: He is sedated on propofol but open eyes on verbal commands. Cranial Nerves: Pupils 4 mm bilaterally, reactive to light. No gaze preference. Blinks threat. EOM full without nystagmus. Right face seem to be droopier than left. Tongue: intubated. Motor: Seems to move the left side better than the left. Deep Tendon Reflexes: 2+ and symmetric throughout Plantars: Right: uping   Left: upgoing. Cerebellar: Unable to test. Gait: unable to test. CV: pulses palpable throughout    Lab Results  Component Value Date/Time   CHOL  Value: 212        ATP III CLASSIFICATION:  <200     mg/dL   Desirable  098-119  mg/dL   Borderline High  >=147    mg/dL   High       * 12/14/5619  6:45 AM    Results for orders placed during the hospital encounter of 06/04/2012 (from the past 48 hour(s))  CBC WITH DIFFERENTIAL     Status: Abnormal   Collection Time    05/24/2012 11:52 AM      Result Value Range   WBC 10.1  4.0 - 10.5 K/uL   RBC 4.31  4.22 - 5.81 MIL/uL   Hemoglobin 13.2  13.0 - 17.0 g/dL   HCT 30.8  65.7 - 84.6 %   MCV 100.0  78.0 - 100.0 fL   MCH 30.6  26.0 - 34.0 pg   MCHC 30.6  30.0 - 36.0 g/dL   RDW 96.2  95.2 - 84.1 %   Platelets 226  150 - 400 K/uL   Neutrophils Relative 51  43 - 77 %   Neutro Abs 5.1  1.7 - 7.7 K/uL   Lymphocytes Relative 41  12 - 46 %   Lymphs Abs 4.1 (*) 0.7 - 4.0 K/uL   Monocytes Relative 7  3 - 12 %   Monocytes Absolute 0.7  0.1 - 1.0 K/uL   Eosinophils Relative 1  0 - 5 %   Eosinophils Absolute 0.1  0.0 - 0.7 K/uL   Basophils Relative 1  0 - 1 %   Basophils Absolute 0.1  0.0 - 0.1 K/uL  COMPREHENSIVE  METABOLIC PANEL     Status: Abnormal   Collection Time    05/19/2012 11:52 AM      Result Value Range   Sodium 151 (*) 135 - 145 mEq/L   Potassium 4.4  3.5 - 5.1 mEq/L   Comment: HEMOLYSIS AT THIS LEVEL MAY AFFECT RESULT   Chloride 107  96 - 112 mEq/L   CO2 10 (*) 19 - 32 mEq/L   Comment: CRITICAL RESULT CALLED TO, READ BACK BY AND VERIFIED WITH:  KYKER A,RN 1306 06-11-2012 SCALES H   Glucose, Bld 282 (*) 70 - 99 mg/dL   BUN 21  6 - 23 mg/dL   Creatinine, Ser 1.61 (*) 0.50 - 1.35 mg/dL   Calcium 9.6  8.4 - 09.6 mg/dL   Total Protein 8.1  6.0 - 8.3 g/dL   Albumin 3.8  3.5 - 5.2 g/dL   AST 33  0 - 37 U/L   ALT 16  0 - 53 U/L   Alkaline Phosphatase 58  39 - 117 U/L   Total Bilirubin 0.4  0.3 - 1.2 mg/dL   GFR calc non Af Amer 39 (*) >90 mL/min   GFR calc Af Amer 46 (*) >90 mL/min   Comment:            The eGFR has been calculated     using the CKD EPI equation.     This calculation has not been     validated in all clinical     situations.     eGFR's persistently     <90 mL/min signify     possible Chronic Kidney Disease.  TROPONIN I     Status: None   Collection Time    Jun 11, 2012 11:52 AM      Result Value Range   Troponin I <0.30  <0.30 ng/mL   Comment:            Due to the release kinetics of cTnI,     a negative result within the first hours     of the onset of symptoms does not rule out     myocardial infarction with certainty.     If myocardial infarction is still suspected,     repeat the test at appropriate intervals.  PROTIME-INR     Status: Abnormal   Collection Time    2012/06/11 11:52 AM      Result Value Range   Prothrombin Time 15.8 (*) 11.6 - 15.2 seconds   INR 1.29  0.00 - 1.49  APTT     Status: None   Collection Time    2012-06-11 11:52 AM      Result Value Range   aPTT 33  24 - 37 seconds  LACTIC ACID, PLASMA     Status: Abnormal   Collection Time    2012/06/11 11:52 AM      Result Value Range   Lactic Acid, Venous 21.2 (*) 0.5 - 2.2 mmol/L   URINALYSIS, ROUTINE W REFLEX MICROSCOPIC     Status: Abnormal   Collection Time    06/11/12 12:11 PM      Result Value Range   Color, Urine AMBER (*) YELLOW   Comment: BIOCHEMICALS MAY BE AFFECTED BY COLOR   APPearance TURBID (*) CLEAR   Specific Gravity, Urine 1.026  1.005 - 1.030   pH 5.0  5.0 - 8.0   Glucose, UA 100 (*) NEGATIVE mg/dL   Hgb urine dipstick LARGE (*) NEGATIVE   Bilirubin Urine MODERATE (*) NEGATIVE   Ketones, ur 15 (*) NEGATIVE mg/dL   Protein, ur >045 (*) NEGATIVE mg/dL   Urobilinogen, UA 1.0  0.0 - 1.0 mg/dL   Nitrite NEGATIVE  NEGATIVE   Leukocytes, UA TRACE (*) NEGATIVE  URINE MICROSCOPIC-ADD ON     Status: Abnormal   Collection Time    2012/06/11 12:11 PM      Result Value Range   Squamous Epithelial / LPF RARE  RARE   WBC, UA 3-6  <3 WBC/hpf   RBC /  HPF 11-20  <3 RBC/hpf   Casts HYALINE CASTS (*) NEGATIVE   Comment: GRANULAR CAST   Sperm, UA PRESENT     Urine-Other AMORPHOUS URATES/PHOSPHATES    POCT I-STAT 3, BLOOD GAS (G3+)     Status: Abnormal   Collection Time    06/01/2012  2:19 PM      Result Value Range   pH, Arterial 7.360  7.350 - 7.450   pCO2 arterial 34.6 (*) 35.0 - 45.0 mmHg   pO2, Arterial 140.0 (*) 80.0 - 100.0 mmHg   Bicarbonate 19.6 (*) 20.0 - 24.0 mEq/L   TCO2 21  0 - 100 mmol/L   O2 Saturation 99.0     Acid-base deficit 5.0 (*) 0.0 - 2.0 mmol/L   Patient temperature 98.6 F     Collection site RADIAL, ALLEN'S TEST ACCEPTABLE     Drawn by Operator     Sample type ARTERIAL    CK TOTAL AND CKMB     Status: Abnormal   Collection Time    05/21/2012  2:28 PM      Result Value Range   Total CK 340 (*) 7 - 232 U/L   CK, MB 5.7 (*) 0.3 - 4.0 ng/mL   Relative Index 1.7  0.0 - 2.5  GLUCOSE, CAPILLARY     Status: Abnormal   Collection Time    06/03/2012  5:56 PM      Result Value Range   Glucose-Capillary 146 (*) 70 - 99 mg/dL  MRSA PCR SCREENING     Status: None   Collection Time    05/29/2012  6:13 PM      Result Value Range   MRSA by  PCR NEGATIVE  NEGATIVE   Comment:            The GeneXpert MRSA Assay (FDA     approved for NASAL specimens     only), is one component of a     comprehensive MRSA colonization     surveillance program. It is not     intended to diagnose MRSA     infection nor to guide or     monitor treatment for     MRSA infections.  GLUCOSE, CAPILLARY     Status: Abnormal   Collection Time    05/26/2012  7:59 PM      Result Value Range   Glucose-Capillary 108 (*) 70 - 99 mg/dL  GLUCOSE, CAPILLARY     Status: Abnormal   Collection Time    05/29/2012 11:07 PM      Result Value Range   Glucose-Capillary 122 (*) 70 - 99 mg/dL    Ct Head Wo Contrast  05/18/2012  *RADIOLOGY REPORT*  Clinical Data: Seizure and altered mental status.  CT HEAD WITHOUT CONTRAST  Technique:  Contiguous axial images were obtained from the base of the skull through the vertex without contrast.  Comparison: 06/01/2012  Findings: Atrophy, chronic small vessel white matter ischemic changes again noted. Remote left MCA and right basal ganglia infarcts again noted. No acute intracranial abnormalities are identified, including mass lesion or mass effect, hydrocephalus, extra-axial fluid collection, midline shift, hemorrhage, or acute infarction.  The visualized bony calvarium is unremarkable.  IMPRESSION: No evidence of acute intracranial abnormality.  Atrophy, chronic small vessel white matter ischemic changes and remote left MCA and right basal ganglia infarcts.   Original Report Authenticated By: Harmon Pier, M.D.    Ct Head Wo Contrast  06/01/2012  *RADIOLOGY REPORT*  Clinical Data: Weakness and altered  mental status.  CT HEAD WITHOUT CONTRAST  Technique:  Contiguous axial images were obtained from the base of the skull through the vertex without contrast.  Comparison: None  Findings: Generalized cerebral volume loss and moderate chronic small vessel white matter ischemic changes noted.  Remote infarcts in the left MCA region and right  basal ganglia noted.  No acute intracranial abnormalities are identified, including mass lesion or mass effect, hydrocephalus, extra-axial fluid collection, midline shift, hemorrhage, or acute infarction.  The visualized bony calvarium is unremarkable.  IMPRESSION: No evidence of acute intracranial abnormality.  Remote left MCA and right basal ganglia infarcts.  Chronic small vessel white matter ischemic changes   Original Report Authenticated By: Harmon Pier, M.D.    Dg Chest Port 1 View  06/07/2012  *RADIOLOGY REPORT*  Clinical Data: Found unresponsive.  Intubation.  PORTABLE CHEST - 1 VIEW  Comparison: None.  Findings: Endotracheal tube tip is 3-4 cm above the carina in expected position.  NG tube enters the stomach.  Minimal left base density, likely lingular scarring.  Right lung is clear.  Heart is normal size.  Blunting of the left costophrenic angle could reflect scarring or small left effusion.  IMPRESSION: Suspect lingular scarring.  Small left effusion versus pleural thickening/scar.  Endotracheal tube 3-4 cm above the carina.   Original Report Authenticated By: Charlett Nose, M.D.      Assessment/Plan: 67 years old with symptomatic post stroke GTC seizures, admitted on 2/15 with GTC SE. Noted to have clinical seizures earlier today, with a pattern suggestive of focal motor seizures that subsequently generalizes. He received 1 gram fosphenytoin the day of admission but no level available. Will suggest given 1 gram fosphenytoin now and checking a dilantin level in 2 hours. Cn increase keppra to 1,500 mg BID IV. Can give ativan 2 mg IV for seizure activity, do not exceed 8 mg in 24 hours. EEG. Will follow up with you.  [name], MD  [facility] [phone]  06/03/2012, 1:56 AM

## 2012-06-03 NOTE — Progress Notes (Signed)
Portable EEG completed

## 2012-06-03 NOTE — Progress Notes (Signed)
PULMONARY  / CRITICAL CARE MEDICINE  Name: Robert Mcclure MRN: [id number] DOB: 04/13/1946    ADMISSION DATE:  06/01/2012 CONSULTATION DATE: 06/13/2012  REFERRING MD :  EDP  CHIEF COMPLAINT:  Resp Failure/Seizure   BRIEF PATIENT DESCRIPTION:  67 yo male admitted with status epilepticus.  Intubated for airway protection. PCCM asked to admit.    Significant PMHx of vascular dementia, seizures, HTN, DM, CVA, Gout  SIGNIFICANT EVENTS / STUDIES:  CT head 2/16 no acute process   LINES / TUBES: 2/16 ETT >>  CULTURES: 2/16 BC x 2 >> 2/16 UC >> 2/17 Sputum >> 2/17 Pneumococcal Ag >> negative 2/17 Legionella Ag >>  2/17 Respiratory viral panel >>   ANTIBIOTICS: 2/16 Vancomycin >> 2/16 Zosyn >>   SUBJECTIVE:  No further seizures.  Fever curve improved.  VITAL SIGNS: Temp:  [99.2 F (37.3 C)-102.7 F (39.3 C)] 99.2 F (37.3 C) (02/17 0400) Pulse Rate:  [88-151] 105 (02/17 1144) Resp:  [12-25] 14 (02/17 1144) BP: (102-197)/(66-177) 135/70 mmHg (02/17 1144) SpO2:  [97 %-100 %] 100 % (02/17 1144) FiO2 (%):  [40 %] 40 % (02/17 1144) Weight:  [192 lb 10.9 oz (87.4 kg)-206 lb (93.441 kg)] 192 lb 10.9 oz (87.4 kg) (02/16 1745) HEMODYNAMICS:   VENTILATOR SETTINGS: Vent Mode:  [-] PRVC FiO2 (%):  [40 %] 40 % Set Rate:  [14 bmp] 14 bmp Vt Set:  [500 mL-600 mL] 600 mL PEEP:  [5 cmH20] 5 cmH20 Pressure Support:  [10 cmH20] 10 cmH20 Plateau Pressure:  [12 cmH20-19 cmH20] 19 cmH20 INTAKE / OUTPUT: Intake/Output     02/16 0701 - 02/17 0700 02/17 0701 - 02/18 0700   I.V. (mL/kg) 1585.5 (18.1) 39.2 (0.4)   Other  80   IV Piggyback 310 260   Total Intake(mL/kg) 1895.5 (21.7) 379.2 (4.3)   Urine (mL/kg/hr) 195 100 (0.2)   Total Output 195 100   Net +1700.5 +279.2          PHYSICAL EXAMINATION: General: No distress Neuro:  Sedated HEENT:  ETT in place Cardiovascular:  s1s2 regular, no murmur Lungs:  Decreased breath sounds Lt base Abdomen:  Soft , non  tender Musculoskeletal:  No edema Skin:  No rash   LABS:  Recent Labs Lab 06/01/12 1247 06/01/2012 1152 06/09/2012 1419 06/03/12 0137 06/03/12 0138 06/03/12 0230 06/03/12 0420  HGB 13.0 13.2  --  12.4*  --   --   --   WBC 6.5 10.1  --  8.6  --   --   --   PLT 175 226  --  148*  --   --   --   NA 148* 151*  --  148*  --   --  147*  K 4.7 4.4  --  3.5  --   --  3.9  CL 111 107  --  113*  --   --  113*  CO2 24 10*  --  21  --   --  19  GLUCOSE 184* 282*  --  135*  --   --  141*  BUN 19 21  --  27*  --   --  28*  CREATININE 1.50* 1.72*  --  2.31*  --   --  2.34*  CALCIUM 9.4 9.6  --  8.1*  --   --  8.0*  MG  --   --   --  2.0  --   --  1.9  PHOS  --   --   --   --   --   --  5.1*  AST  --  33  --  44*  --   --   --   ALT  --  16  --  16  --   --   --   ALKPHOS  --  58  --  43  --   --   --   BILITOT  --  0.4  --  0.5  --   --   --   PROT  --  8.1  --  6.3  --   --   --   ALBUMIN  --  3.8  --  2.9*  --   --   --   APTT  --  33  --   --   --   --   --   INR  --  1.29  --   --   --   --   --   LATICACIDVEN  --  21.2*  --   --  1.5  --   --   TROPONINI  --  <0.30  --   --   --   --   --   PHART  --   --  7.360  --   --  7.437  --   PCO2ART  --   --  34.6*  --   --  31.6*  --   PO2ART  --   --  140.0*  --   --  128.0*  --     Recent Labs Lab 05/19/2012 1756 06/09/2012 1959 06/01/2012 2307 06/03/12 0419 06/03/12 0814  GLUCAP 146* 108* 122* 120* 133*    Imaging: Ct Head Wo Contrast  05/24/2012  *RADIOLOGY REPORT*  Clinical Data: Seizure and altered mental status.  CT HEAD WITHOUT CONTRAST  Technique:  Contiguous axial images were obtained from the base of the skull through the vertex without contrast.  Comparison: 06/01/2012  Findings: Atrophy, chronic small vessel white matter ischemic changes again noted. Remote left MCA and right basal ganglia infarcts again noted. No acute intracranial abnormalities are identified, including mass lesion or mass effect, hydrocephalus, extra-axial  fluid collection, midline shift, hemorrhage, or acute infarction.  The visualized bony calvarium is unremarkable.  IMPRESSION: No evidence of acute intracranial abnormality.  Atrophy, chronic small vessel white matter ischemic changes and remote left MCA and right basal ganglia infarcts.   Original Report Authenticated By: Harmon Pier, M.D.    Ct Head Wo Contrast  06/01/2012  *RADIOLOGY REPORT*  Clinical Data: Weakness and altered mental status.  CT HEAD WITHOUT CONTRAST  Technique:  Contiguous axial images were obtained from the base of the skull through the vertex without contrast.  Comparison: None  Findings: Generalized cerebral volume loss and moderate chronic small vessel white matter ischemic changes noted.  Remote infarcts in the left MCA region and right basal ganglia noted.  No acute intracranial abnormalities are identified, including mass lesion or mass effect, hydrocephalus, extra-axial fluid collection, midline shift, hemorrhage, or acute infarction.  The visualized bony calvarium is unremarkable.  IMPRESSION: No evidence of acute intracranial abnormality.  Remote left MCA and right basal ganglia infarcts.  Chronic small vessel white matter ischemic changes   Original Report Authenticated By: Harmon Pier, M.D.    Dg Chest Port 1 View  06/03/2012  *RADIOLOGY REPORT*  Clinical Data: Respiratory failure  PORTABLE CHEST - 1 VIEW  Comparison: Film at 0200 hours  Findings: Stable positioning of endotracheal tube with tip approximately 2 cm above the carina.  Atelectasis/consolidation of  the left lower lobe is stable.  No pulmonary edema.  Stable heart size.  IMPRESSION: Stable atelectasis/consolidation of the left lower lobe.   Original Report Authenticated By: [name], M.D.    Dg Chest Port 1 View  06/03/2012  *RADIOLOGY REPORT*  Clinical Data: Respiratory failure  PORTABLE CHEST - 1 VIEW  Comparison: 06/03/2012  Findings: Endotracheal tube tip 2.8 cm proximal to the carina.  NG tube descends  below the level of the image.  Small left pleural effusion with associated consolidation.  Right lung predominate clear.  Heart size and mediastinal contours unchanged, mildly prominent.  No acute osseous finding.  Curvature of the spine may be accentuated by positioning.  IMPRESSION: Endotracheal tube tip 2.8 cm proximal to the carina.  Small left effusion and associated consolidation; atelectasis versus infiltrate.   Original Report Authenticated By: Jearld Lesch, M.D.    Dg Chest Port 1 View  06/10/2012  *RADIOLOGY REPORT*  Clinical Data: Found unresponsive.  Intubation.  PORTABLE CHEST - 1 VIEW  Comparison: None.  Findings: Endotracheal tube tip is 3-4 cm above the carina in expected position.  NG tube enters the stomach.  Minimal left base density, likely lingular scarring.  Right lung is clear.  Heart is normal size.  Blunting of the left costophrenic angle could reflect scarring or small left effusion.  IMPRESSION: Suspect lingular scarring.  Small left effusion versus pleural thickening/scar.  Endotracheal tube 3-4 cm above the carina.   Original Report Authenticated By: Charlett Nose, M.D.       ASSESSMENT / PLAN:  PULMONARY A:  Acute respiratory failure 2nd to seizure and aspiration. P:   Full vent support for now F/u CXR Bronchial hygiene  CARDIOVASCULAR A: Hx of HTN. P:  Monitor blood pressure  RENAL A:  Acute renal failure. Hypernatremia. P:   Continue 1/2 NS at 50 ml/hr Monitor renal fx, urine outpt, electrolytes  GASTROINTESTINAL A:  Nutrition. P:   Tube feeds while on vent Protonix for SUP  HEMATOLOGIC A:  No acute issues. P:  F/u CBC SQ heparin for DVT prevention  INFECTIOUS A:  Fever >> likely from seizure, and aspiration pneumonia. P:   Continue vancomycin, zosyn pending cx results  ENDOCRINE A:  DM type II. Hx of gout. P:   SSI Hold allopurinol  NEUROLOGIC A:  Hx of Seizure disorder s/p status epilepticus. Hx of CVA. P:   AED's per  neurology F/u EEG from 2/17  Updated family at bedside.  CC time 35 minutes.    Coralyn Helling, MD [facility] Pulmonary/Critical Care 06/03/2012, 12:04 PM Pager:  [phone] After 3pm call: [phone]

## 2012-06-03 NOTE — Progress Notes (Addendum)
INITIAL NUTRITION ASSESSMENT  DOCUMENTATION CODES Per approved criteria  -Not Applicable   Consult received to initiate TF. Orders written.   INTERVENTION: If pt remain intubated recommend Initiate Jevity 1.2 @ 20 ml/hr via OG tube and increase by 10 ml every 4 hours to goal rate of 40 ml/hr. 30 ml Prostat QID.  At goal rate, tube feeding regimen will provide 1552 kcal, 113 grams of protein, and 777 ml of H2O.   With current rate of Propofol will provide 1847 total kcal (97% of needs)  NUTRITION DIAGNOSIS: Inadequate oral intake related to inability to eat as evidenced by NPO status.  Goal: Pt to meet >/= 90% of their estimated nutrition needs.   Monitor:  Vent status, weight, labs  Reason for Assessment: Mechanical Ventilation   67 y.o. male  Admitting Dx: Seizures.   ASSESSMENT: Patient is currently intubated on ventilator support for airway protection. Pt was admitted s/p seizure. Pt with hx of uncontrolled DM, no recent hgbA1C on file. Pt with 2 day hx of N/V PTA. Has OG tube in place. Sister at bedside and reports that pt has had no recent weight changes. Sister reports good appetite PTA. Pt has aide who comes daily and cooks meals for pt which he can then re-heat for the day. Per sister blood sugar is in better control.  Pt discussed during ICU rounds and with RN.  MV: 8.6 Temp:Temp (24hrs), Avg:101.1 F (38.4 C), Min:99.2 F (37.3 C), Max:102.7 F (39.3 C) temp better controlled at this time, 37.3  Propofol: 11.2 ml/hr providing 295 kcal   Height: Ht Readings from Last 1 Encounters:  05/29/2012 6' (1.829 m)    Weight: Wt Readings from Last 1 Encounters:  05/21/2012 192 lb 10.9 oz (87.4 kg)    Ideal Body Weight: 80.9 kg  % Ideal Body Weight: 108%  Wt Readings from Last 10 Encounters:  05/31/2012 192 lb 10.9 oz (87.4 kg)  01/23/12 206 lb 12.7 oz (93.8 kg)    Usual Body Weight: unknown  % Usual Body Weight: -  BMI:  Body mass index is 26.13  kg/(m^2).  Estimated Nutritional Needs: Kcal: 1907 Protein: 104-122 grams  Fluid: >1.8 L/day  Skin: no issues noted. Nutrition Focused Physical Exam:  Subcutaneous Fat:  Orbital Region: WNL Upper Arm Region: WNL Thoracic and Lumbar Region: WNL  Muscle:  Temple Region: WNL Clavicle Bone Region: WNL Clavicle and Acromion Bone Region: WNL Scapular Bone Region: NA Dorsal Hand: WNL Patellar Region: WNL Anterior Thigh Region: WNL Posterior Calf Region: NA  Edema: not noted  Diet Order: NPO  EDUCATION NEEDS: -No education needs identified at this time   Intake/Output Summary (Last 24 hours) at 06/03/12 1055 Last data filed at 06/03/12 1000  Gross per 24 hour  Intake 2218.47 ml  Output    265 ml  Net 1953.47 ml    Last BM: PTA   Labs:   Recent Labs Lab 06/13/2012 1152 06/03/12 0137 06/03/12 0420  NA 151* 148* 147*  K 4.4 3.5 3.9  CL 107 113* 113*  CO2 10* 21 19  BUN 21 27* 28*  CREATININE 1.72* 2.31* 2.34*  CALCIUM 9.6 8.1* 8.0*  MG  --  2.0 1.9  PHOS  --   --  5.1*  GLUCOSE 282* 135* 141*    CBG (last 3)   Recent Labs  06/05/2012 2307 06/03/12 0419 06/03/12 0814  GLUCAP 122* 120* 133*    Scheduled Meds: . antiseptic oral rinse  1 application Mouth Rinse QID  .  chlorhexidine  15 mL Mouth/Throat BID  . insulin aspart  0-20 Units Subcutaneous Q4H  . levetiracetam  1,000 mg Intravenous Q12H  . pantoprazole (PROTONIX) IV  40 mg Intravenous Q12H  . piperacillin-tazobactam (ZOSYN)  IV  3.375 g Intravenous Q8H  . [START ON 06/04/2012] vancomycin  1,250 mg Intravenous Q24H    Continuous Infusions: . sodium chloride 100 mL/hr at 06/03/12 0600  . propofol 20 mcg/kg/min (06/03/12 1000)    Past Medical History  Diagnosis Date  . Hypertension   . Diabetes mellitus   . CVA (cerebral vascular accident)   . Stroke     No past surgical history on file.  Kendell Bane RD, LDN, CNSC [phone] Pager [phone] After Hours Pager

## 2012-06-03 NOTE — Procedures (Signed)
Central Venous Catheter Insertion Procedure Note ALVIA TORY [id number] September 19, 1945  Procedure: Insertion of Central Venous Catheter Indications: Assessment of intravascular volume  Procedure Details Consent: Risks of procedure as well as the alternatives and risks of each were explained to the (patient/caregiver).  Consent for procedure obtained. and Unable to obtain consent because of altered level of consciousness. Time Out: Verified patient identification, verified procedure, site/side was marked, verified correct patient position, special equipment/implants available, medications/allergies/relevent history reviewed, required imaging and test results available.  Performed  Maximum sterile technique was used including antiseptics, cap, gloves, gown, hand hygiene, mask and sheet. Skin prep: Chlorhexidine; local anesthetic administered A antimicrobial bonded/coated triple lumen catheter was placed in the right internal jugular vein using the Seldinger technique. Ultrasound guidance used.yes Catheter placed to 16 cm. Blood aspirated via all 3 ports and then flushed x 3. Line sutured x 2 and dressing applied.  Evaluation Blood flow good Complications: No apparent complications Patient did tolerate procedure well. Chest X-ray ordered to verify placement.  CXR: pending.  Brett Canales Minor ACNP Adolph Pollack PCCM Pager [phone] till 3 pm If no answer page [phone] 06/03/2012, 12:48 PM   I was present for procedure.  Coralyn Helling, MD [facility] Pulmonary/Critical Care 06/03/2012, 1:10 PM Pager:  [phone] After 3pm call: [phone]

## 2012-06-03 NOTE — Progress Notes (Signed)
The patient febrile and nursing staff reporting seizure activity.   Plan: seizure, cont Keprra and Propofol; neurology consulted  Fever: unclear etiology, patient with cc of nausea and vomiting, ARF and increased density in the L on CXR; also intubated for airway protection, concern for aspiration PNA;   Start vanc and zosyn for possible aspiration PNA (covers also an abdominal source and possible UTI); obtain BC, respiratory culture, RVP,  stool culture, urine culture, renal US (acute renal failure and abnormal UA).   Deescalate antibiotics according to culture and sensitivity.

## 2012-06-03 NOTE — Progress Notes (Signed)
Nursing 1215 Patient's SBG  is 51.  Dr. Craige Cotta made aware.  Dextrose 50% 12.5gm given.  CBG reassessed.  CBG now 93.  No further dextrose given. Will continue to assess CBGs.

## 2012-06-03 NOTE — Progress Notes (Signed)
Nursing 1600 Patient's CBG 61.  Hypoglycemia protocol initiated.  Dextrose 50% 12.5gm given IVP.  CBG checked in 15 minutes.  CBG 101.  Dr. Delton Coombes made aware of hypoglycemic event.  No new orders at this time.

## 2012-06-03 NOTE — Progress Notes (Addendum)
ANTIBIOTIC CONSULT NOTE - INITIAL  Pharmacy Consult for Vancocin and Zosyn Indication: rule out pneumonia  No Known Allergies  Patient Measurements: Height: 6' (182.9 cm) Weight: 192 lb 10.9 oz (87.4 kg) IBW/kg (Calculated) : 77.6  Vital Signs: Temp: 100.6 F (38.1 C) (02/17 0000) Temp src: Oral (02/17 0000) BP: 102/67 mmHg (02/17 0100) Pulse Rate: 116 (02/17 0100) Intake/Output from previous day: 02/16 0701 - 02/17 0700 In: 937.7 [I.V.:827.7; IV Piggyback:110] Out: 80 [Urine:80] Intake/Output from this shift: Total I/O In: 621.2 [I.V.:511.2; IV Piggyback:110] Out: 80 [Urine:80]  Labs:  Recent Labs  06/01/12 1247 06/09/2012 1152  WBC 6.5 10.1  HGB 13.0 13.2  PLT 175 226  CREATININE 1.50* 1.72*   Estimated Creatinine Clearance: 45.7 ml/min (by C-G formula based on Cr of 1.72).  Microbiology: Recent Results (from the past 720 hour(s))  MRSA PCR SCREENING     Status: None   Collection Time    05/28/2012  6:13 PM      Result Value Range Status   MRSA by PCR NEGATIVE  NEGATIVE Final   Comment:            The GeneXpert MRSA Assay (FDA     approved for NASAL specimens     only), is one component of a     comprehensive MRSA colonization     surveillance program. It is not     intended to diagnose MRSA     infection nor to guide or     monitor treatment for     MRSA infections.    Medical History: Past Medical History  Diagnosis Date  . Hypertension   . Diabetes mellitus   . CVA (cerebral vascular accident)   . Stroke     Medications:  Prescriptions prior to admission  Medication Sig Dispense Refill  . allopurinol (ZYLOPRIM) 100 MG tablet Take 100 mg by mouth 3 (three) times a week. Take on Monday, Wednesday, Friday      . amLODipine (NORVASC) 10 MG tablet Take 10 mg by mouth daily.      Marland Kitchen aspirin 325 MG tablet Take 325 mg by mouth daily.      . cloNIDine (CATAPRES) 0.3 MG tablet Take 0.3 mg by mouth every 8 (eight) hours.      Marland Kitchen donepezil (ARICEPT) 10 MG  tablet Take 10 mg by mouth daily.      . furosemide (LASIX) 20 MG tablet Take 20 mg by mouth every Monday, Wednesday, and Friday.      . insulin glargine (LANTUS) 100 UNIT/ML injection Inject 30 Units into the skin at bedtime.  10 mL    . levETIRAcetam (KEPPRA) 500 MG tablet Take 1 tablet (500 mg total) by mouth 2 (two) times daily.  60 tablet  0  . losartan (COZAAR) 100 MG tablet Take 0.5 tablets (50 mg total) by mouth daily.      . metFORMIN (GLUCOPHAGE) 500 MG tablet Take 500 mg by mouth 2 (two) times daily with a meal.      . metoprolol (LOPRESSOR) 100 MG tablet Take 100 mg by mouth 2 (two) times daily.      . ondansetron (ZOFRAN) 4 MG tablet Take 1 tablet (4 mg total) by mouth every 8 (eight) hours as needed for nausea.  10 tablet  0  . pioglitazone (ACTOS) 30 MG tablet Take 30 mg by mouth daily.      . simvastatin (ZOCOR) 20 MG tablet Take 20 mg by mouth every evening.  Scheduled:  . antiseptic oral rinse  1 application Mouth Rinse QID  . chlorhexidine  15 mL Mouth/Throat BID  . [COMPLETED] fosPHENYtoin (CEREBYX) IV  1,700 mg PE Intravenous STAT  . insulin aspart  0-20 Units Subcutaneous Q4H  . [COMPLETED] levetiracetam  1,000 mg Intravenous Once  . levetiracetam  1,000 mg Intravenous Q12H  . [COMPLETED] midazolam  2 mg Intravenous Once  . pantoprazole (PROTONIX) IV  40 mg Intravenous Q12H  . piperacillin-tazobactam  3.375 g Intravenous Once  . piperacillin-tazobactam (ZOSYN)  IV  3.375 g Intravenous Q8H  . [COMPLETED] sodium chloride  1,000 mL Intravenous Once  . vancomycin  750 mg Intravenous Once  . vancomycin  750 mg Intravenous Q12H  . [DISCONTINUED] chlorhexidine  15 mL Mouth/Throat BID  . [DISCONTINUED] fosPHENYtoin (CEREBYX) IV  20 mg PE/kg Intravenous Once  . [DISCONTINUED] heparin subcutaneous  5,000 Units Subcutaneous Q8H  . [DISCONTINUED] insulin aspart  2-6 Units Subcutaneous Q4H  . [DISCONTINUED] levetiracetam  1,000 mg Intravenous Q12H  . [DISCONTINUED]  levetiracetam  1,000 mg Intravenous Once  . [DISCONTINUED] levetiracetam  1,000 mg Intravenous Q12H  . [DISCONTINUED] levetiracetam  500 mg Intravenous Q12H  . [DISCONTINUED] pantoprazole (PROTONIX) IV  40 mg Intravenous Q24H   Infusions:  . sodium chloride 100 mL/hr at 05/31/2012 2100  . propofol 10 mcg/kg/min (06/03/12 0030)  . [DISCONTINUED] sodium chloride    . [DISCONTINUED] midazolam (VERSED) infusion Stopped (05/21/2012 1716)    Assessment: 67yo male w/ h/o Sz d/o had c/o N/V for 2d and today arrived in status epilepticus w/ ARF, now febrile with cont'd Sz activity and inc'd WBC, concern for aspiration PNA vs other source, to begin broad-spectrum ABX.  Goal of Therapy:  Vancomycin trough level 15-20 mcg/ml  Plan:  Will begin vancomycin 750mg  IV Q12H and Zosyn 3.375g IV Q8H and monitor CBC, CrCl, Cx, levels prn.  Colleen Can PharmD BCPS 06/03/2012,1:35 AM  Addendum:  Scr up to 2.34  Plan: 1) Change Vancomycin to 1250 mg iv Q 24 hours 2) Continue to follow  Thank you. Okey Regal, PharmD

## 2012-06-03 NOTE — Progress Notes (Signed)
UR completed 

## 2012-06-03 NOTE — Progress Notes (Signed)
Nursing 1300 Dr. Craige Cotta made aware of patient's continued low UOP.  No new orders at this time.

## 2012-06-03 NOTE — Progress Notes (Signed)
  Pt developed R side tremors which became generalized. Lasted ~60 sec. Paused for 30 sec and then  generalized tremors began again for ~75sec. VS WNL ELINK paged.  Dr. Frederico Hamman placed orders in Epic. Will continue to monitor.  Oletta Cohn RN

## 2012-06-04 ENCOUNTER — Inpatient Hospital Stay (HOSPITAL_COMMUNITY): Payer: PRIVATE HEALTH INSURANCE

## 2012-06-04 DIAGNOSIS — J690 Pneumonitis due to inhalation of food and vomit: Secondary | ICD-10-CM | POA: Diagnosis not present

## 2012-06-04 LAB — URINE CULTURE
Colony Count: NO GROWTH
Culture: NO GROWTH

## 2012-06-04 LAB — CBC
HCT: 32.2 % — ABNORMAL LOW (ref 39.0–52.0)
Hemoglobin: 10.4 g/dL — ABNORMAL LOW (ref 13.0–17.0)
MCHC: 32.3 g/dL (ref 30.0–36.0)
WBC: 7.6 10*3/uL (ref 4.0–10.5)

## 2012-06-04 LAB — GLUCOSE, CAPILLARY
Glucose-Capillary: 97 mg/dL (ref 70–99)
Glucose-Capillary: 113 mg/dL — ABNORMAL HIGH (ref 70–99)
Glucose-Capillary: 105 mg/dL — ABNORMAL HIGH (ref 70–99)

## 2012-06-04 LAB — BASIC METABOLIC PANEL
BUN: 35 mg/dL — ABNORMAL HIGH (ref 6–23)
Chloride: 109 mEq/L (ref 96–112)
Glucose, Bld: 133 mg/dL — ABNORMAL HIGH (ref 70–99)
Potassium: 3.6 mEq/L (ref 3.5–5.1)

## 2012-06-04 MED ORDER — SODIUM CHLORIDE 0.9 % IV SOLN
1500.0000 mg | Freq: Two times a day (BID) | INTRAVENOUS | Status: DC
  Administered 2012-06-04 – 2012-06-10 (×13): 1500 mg via INTRAVENOUS
  Filled 2012-06-04 (×20): qty 15

## 2012-06-04 MED ORDER — SODIUM CHLORIDE 0.9 % IV SOLN
750.0000 mg | Freq: Once | INTRAVENOUS | Status: AC
  Administered 2012-06-04: 750 mg via INTRAVENOUS
  Filled 2012-06-04: qty 7.5

## 2012-06-04 NOTE — Progress Notes (Signed)
Nursing 1000 Propofol stopped per MD request, once more awake will switch to pressure support and trial patient for weaning and extubation per MD.  MD to room and patient had a generalized seizure per MD.  Propofol resumed and increased rate, MD aware.  MD made neurology aware.  Will keep patient on full support on ventilator per MD.

## 2012-06-04 NOTE — Procedures (Signed)
EEG NUMBER:  REFERRING PHYSICIAN:  Dr. Leroy Kennedy.  HISTORY:  A 67 year old male admitted in status epilepticus, currently unresponsive.  MEDICATIONS:  Keppra, NovoLog, Protonix, Zosyn, vancomycin, propofol.  CONDITIONS OF RECORDING:  This is a 16-channel EEG carried out in the unresponsive state.  DESCRIPTION:  The background activity is of low voltage.  There are some alpha rhythms seen with a maximum background rhythm at 8 Hz alpha. There are frequent intervening episodes of polymorphic delta rhythm that slow the background.  The patient does seem to achieve stage II sleep with some symmetrical sleep spindles and vertex central sharp transients noted.  With stimulation, there is some quickening of the background rhythm.  Hyperventilation and intermittent photic stimulation were not performed.  IMPRESSION:  This EEG is characterized by general background slowing.  There is some evidence of drowse asleep as well.  No epileptiform activity is noted.          ______________________________ Thana Farr, MD    OV:FIEP D:  06/04/2012 08:57:20  T:  06/04/2012 09:21:46  Job #:  [id number]

## 2012-06-04 NOTE — Progress Notes (Signed)
PULMONARY  / CRITICAL CARE MEDICINE  Name: Robert Mcclure MRN: [id number] DOB: 05-Mar-1946    ADMISSION DATE:  05/27/2012 CONSULTATION DATE: 05/22/2012  REFERRING MD :  EDP  CHIEF COMPLAINT:  Resp Failure/Seizure   BRIEF PATIENT DESCRIPTION:  67 yo male admitted with status epilepticus.  Intubated for airway protection. PCCM asked to admit.    Significant PMHx of vascular dementia, seizures, HTN, DM, CVA, Gout  SIGNIFICANT EVENTS: 2/16 Seizure, VDRF, admit to ICU 2/18 Generalized seizure with WUA   STUDIES:  2/15 CT head >> Remote Lt MCA and Rt basal ganglia infarcts, chronic small vessel white matter ischemic changes 2/16 CT head >> no change 2/17 EEG >> background slowing, no epileptiform activity 2/17 Renal u/s >> no hydronephrosis  LINES / TUBES: 2/16 ETT >> 2/17 Rt IJ CVL >>  CULTURES: 2/16 BC x 2 >> 2/16 UC >> negative 2/17 Sputum >> 2/17 Pneumococcal Ag >> negative 2/17 Legionella Ag >> negative  ANTIBIOTICS: 2/16 Vancomycin >> 2/16 Zosyn >>   SUBJECTIVE:  Follows commands with initial WUA >> then had generalized seizure.  VITAL SIGNS: Temp:  [98.7 F (37.1 C)-99.8 F (37.7 C)] 98.7 F (37.1 C) (02/18 0752) Pulse Rate:  [92-114] 96 (02/18 0900) Resp:  [12-21] 14 (02/18 0900) BP: (111-187)/(64-95) 142/77 mmHg (02/18 0900) SpO2:  [98 %-100 %] 100 % (02/18 0900) FiO2 (%):  [40 %] 40 % (02/18 0900) Weight:  [201 lb 1 oz (91.2 kg)] 201 lb 1 oz (91.2 kg) (02/18 0449) VENTILATOR SETTINGS: Vent Mode:  [-] PRVC FiO2 (%):  [40 %] 40 % Set Rate:  [14 bmp] 14 bmp Vt Set:  [600 mL] 600 mL PEEP:  [5 cmH20] 5 cmH20 Plateau Pressure:  [14 cmH20-19 cmH20] 18 cmH20 INTAKE / OUTPUT: Intake/Output     02/17 0701 - 02/18 0700 02/18 0701 - 02/19 0700   P.O. 300    I.V. (mL/kg) 1321.6 (14.5) 116.8 (1.3)   Other 320 20   NG/GT 480 210   IV Piggyback 682.5 122.5   Total Intake(mL/kg) 3104.1 (34) 469.3 (5.1)   Urine (mL/kg/hr) 562 (0.3) 110 (0.4)   Total Output  562 110   Net +2542.1 +359.3          PHYSICAL EXAMINATION: General: No distress Neuro:  Sedated HEENT:  ETT in place Cardiovascular:  s1s2 regular, no murmur Lungs:  Decreased breath sounds Lt base Abdomen:  Soft , non tender Musculoskeletal:  No edema Skin:  No rash   LABS:  Recent Labs Lab 06/08/2012 1152 06/05/2012 1419 06/03/12 0137 06/03/12 0138 06/03/12 0230 06/03/12 0420 06/03/12 1626 06/04/12 0500  HGB 13.2  --  12.4*  --   --   --   --  10.4*  WBC 10.1  --  8.6  --   --   --   --  7.6  PLT 226  --  148*  --   --   --   --  120*  NA 151*  --  148*  --   --  147*  --  145  K 4.4  --  3.5  --   --  3.9  --  3.6  CL 107  --  113*  --   --  113*  --  109  CO2 10*  --  21  --   --  19  --  24  GLUCOSE 282*  --  135*  --   --  141*  --  133*  BUN 21  --  27*  --   --  28*  --  35*  CREATININE 1.72*  --  2.31*  --   --  2.34*  --  2.15*  CALCIUM 9.6  --  8.1*  --   --  8.0*  --  7.4*  MG  --   --  2.0  --   --  1.9  --   --   PHOS  --   --   --   --   --  5.1*  --   --   AST 33  --  44*  --   --   --   --   --   ALT 16  --  16  --   --   --   --   --   ALKPHOS 58  --  43  --   --   --   --   --   BILITOT 0.4  --  0.5  --   --   --   --   --   PROT 8.1  --  6.3  --   --   --   --   --   ALBUMIN 3.8  --  2.9*  --   --   --  2.9* 2.7*  APTT 33  --   --   --   --   --   --   --   INR 1.29  --   --   --   --   --   --   --   LATICACIDVEN 21.2*  --   --  1.5  --   --   --   --   TROPONINI <0.30  --   --   --   --   --   --   --   PHART  --  7.360  --   --  7.437  --   --   --   PCO2ART  --  34.6*  --   --  31.6*  --   --   --   PO2ART  --  140.0*  --   --  128.0*  --   --   --     Recent Labs Lab 06/03/12 1618 06/03/12 2006 06/04/12 0014 06/04/12 0419 06/04/12 0822  GLUCAP 101* 125* 97 122* 109*    Imaging: Ct Head Wo Contrast  06/04/2012  *RADIOLOGY REPORT*  Clinical Data: Seizure and altered mental status.  CT HEAD WITHOUT CONTRAST  Technique:  Contiguous  axial images were obtained from the base of the skull through the vertex without contrast.  Comparison: 06/01/2012  Findings: Atrophy, chronic small vessel white matter ischemic changes again noted. Remote left MCA and right basal ganglia infarcts again noted. No acute intracranial abnormalities are identified, including mass lesion or mass effect, hydrocephalus, extra-axial fluid collection, midline shift, hemorrhage, or acute infarction.  The visualized bony calvarium is unremarkable.  IMPRESSION: No evidence of acute intracranial abnormality.  Atrophy, chronic small vessel white matter ischemic changes and remote left MCA and right basal ganglia infarcts.   Original Report Authenticated By: Harmon Pier, M.D.    US Renal Port  06/03/2012   *RADIOLOGY REPORT*  Clinical Data: No acute renal failure.  RENAL/URINARY TRACT ULTRASOUND COMPLETE  Comparison:  None.  Findings:  The study was performed portably.  The patient is intubated on a ventilator.  Right Kidney:  There is possible mild cortical lobularity. No hydronephrosis.  Well-preserved cortex.  Normal size and parenchymal echotexture without focal abnormalities.  Renal length 10.8 cm.  Left Kidney:  There is possible mild cortical lobularity. No hydronephrosis.  Well-preserved cortex.  Normal size and parenchymal echotexture without focal abnormalities.  Renal length 10.0 cm.  Bladder:  Decompressed by Foley catheter and not visualized.  IMPRESSION: Normal examination.  No hydronephrosis.   Original Report Authenticated By: Carey Bullocks, M.D.    Dg Chest Port 1 View  06/04/2012  *RADIOLOGY REPORT*  Clinical Data: Pneumonia.  PORTABLE CHEST - 1 VIEW  Comparison: Chest x-ray 06/03/2012.  Findings: An endotracheal tube is in place with tip 3.0 cm above the carina. Lung volumes are low.  There is a right-sided internal jugular central venous catheter with tip terminating in the distal superior vena cava.  Nasogastric tube is seen extending into the proximal  stomach.  Bibasilar opacities (left greater than right), favored to reflect predominately subsegmental atelectasis, however, some underlying airspace consolidation in the left lower lobe is difficult to exclude.  Small left pleural effusion.  Pulmonary vasculature is within normal limits.  Heart size is upper limits of normal. The patient is rotated to the left  on today's exam, resulting in distortion of the mediastinal contours and reduced diagnostic sensitivity and specificity for mediastinal pathology. Atherosclerosis in the thoracic aorta.  IMPRESSION: 1.  Support apparatus, as above. 2.  Persistent bibasilar areas of atelectasis with possible superimposed airspace consolidation in the left lower lobe which could reflect pneumonia or sequelae of aspiration, in addition to a small left-sided pleural effusion. 3.  Atherosclerosis.   Original Report Authenticated By: Trudie Reed, M.D.    Dg Chest Port 1 View  06/03/2012  *RADIOLOGY REPORT*  Clinical Data: 67 year old male central line placement.  PORTABLE CHEST - 1 VIEW  Comparison: 0158 hours the same day and earlier.  Findings: Semi upright AP portable view 1300 hours.  Right IJ central line now placed.  Tip just below the level of the carina. Endotracheal tube tip is stable.  NG tube is in place but the tip is at the level of the distal esophagus.  The side-hole is near the middle of the mediastinum.  Stable lung volumes.  Stable patchy retrocardiac opacity.  No pneumothorax, pulmonary edema or large effusion.  IMPRESSION: 1.  Right IJ central line tip at the level of the SVC.  No pneumothorax. 2.  Advance NG tube about 10 cm to place the side hole at the level of the stomach. 3. Otherwise, stable lines and tubes. 4.  Stable ventilation.   Original Report Authenticated By: Erskine Speed, M.D.    Dg Chest Port 1 View  06/03/2012  *RADIOLOGY REPORT*  Clinical Data: Respiratory failure  PORTABLE CHEST - 1 VIEW  Comparison: Film at 0200 hours  Findings:  Stable positioning of endotracheal tube with tip approximately 2 cm above the carina.  Atelectasis/consolidation of the left lower lobe is stable.  No pulmonary edema.  Stable heart size.  IMPRESSION: Stable atelectasis/consolidation of the left lower lobe.   Original Report Authenticated By: [name], M.D.    Dg Chest Port 1 View  06/03/2012  *RADIOLOGY REPORT*  Clinical Data: Respiratory failure  PORTABLE CHEST - 1 VIEW  Comparison: 06/01/2012  Findings: Endotracheal tube tip 2.8 cm proximal to the carina.  NG tube descends below the level of the image.  Small left pleural effusion with associated consolidation.  Right lung predominate clear.  Heart size and mediastinal contours unchanged, mildly prominent.  No acute osseous finding.  Curvature of the spine may be accentuated by positioning.  IMPRESSION: Endotracheal tube tip 2.8 cm proximal to the carina.  Small left effusion and associated consolidation; atelectasis versus infiltrate.   Original Report Authenticated By: Jearld Lesch, M.D.    Dg Chest Port 1 View  05/22/2012  *RADIOLOGY REPORT*  Clinical Data: Found unresponsive.  Intubation.  PORTABLE CHEST - 1 VIEW  Comparison: None.  Findings: Endotracheal tube tip is 3-4 cm above the carina in expected position.  NG tube enters the stomach.  Minimal left base density, likely lingular scarring.  Right lung is clear.  Heart is normal size.  Blunting of the left costophrenic angle could reflect scarring or small left effusion.  IMPRESSION: Suspect lingular scarring.  Small left effusion versus pleural thickening/scar.  Endotracheal tube 3-4 cm above the carina.   Original Report Authenticated By: Charlett Nose, M.D.    Dg Abd Portable 1v  06/03/2012  *RADIOLOGY REPORT*  Clinical Data: Gastric tube placement  PORTABLE ABDOMEN - 1 VIEW  Comparison: None  Findings: NG tube is in the body of the stomach.  Bowel gas pattern is normal.  Negative for bowel obstruction.  No acute bony change.   IMPRESSION: Gastric tube is in the body of the stomach.   Original Report Authenticated By: Janeece Riggers, M.D.       ASSESSMENT / PLAN:  NEUROLOGIC A:  Hx of Seizure disorder s/p status epilepticus >> recurrent seizure with WUA 2/18. Hx of CVA. P:   AED's per neurology Continue sedation while on vent  PULMONARY A:  Acute respiratory failure 2nd to seizure and aspiration. P:   Full vent support until neuro status stable F/u CXR Bronchial hygiene  CARDIOVASCULAR A: Hx of HTN. P:  Monitor blood pressure  RENAL A:  Acute renal failure >> improving. P:   Continue 1/2 NS at 50 ml/hr Monitor renal fx, urine outpt, electrolytes  GASTROINTESTINAL A:  Nutrition. P:   Tube feeds while on vent Protonix for SUP  HEMATOLOGIC A:  Anemia of critical illness. P:  F/u CBC SQ heparin for DVT prevention  INFECTIOUS A:  Aspiration pneumonia. P:   D2/x vancomycin, zosyn  ENDOCRINE A:  DM type II. Hx of gout. P:   SSI Hold allopurinol  Resolved problems >> Hypernatremia.  CC time 35 minutes.    Coralyn Helling, MD [facility] Pulmonary/Critical Care 06/04/2012, 9:54 AM Pager:  [phone] After 3pm call: [phone]

## 2012-06-04 NOTE — Progress Notes (Addendum)
NEURO HOSPITALIST PROGRESS NOTE   SUBJECTIVE:                                                                                                                        Intubated, on Propofol, follows no commands.   OBJECTIVE:                                                                                                                           Vital signs in last 24 hours: Temp:  [98.9 F (37.2 C)-99.8 F (37.7 C)] 98.9 F (37.2 C) (02/18 0417) Pulse Rate:  [92-114] 106 (02/18 0752) Resp:  [12-21] 18 (02/18 0752) BP: (111-187)/(64-95) 147/83 mmHg (02/18 0752) SpO2:  [98 %-100 %] 100 % (02/18 0752) FiO2 (%):  [40 %] 40 % (02/18 0752) Weight:  [91.2 kg (201 lb 1 oz)] 91.2 kg (201 lb 1 oz) (02/18 0449)  Intake/Output from previous day: 02/17 0701 - 02/18 0700 In: 3104.1 [P.O.:300; I.V.:1321.6; NG/GT:480; IV Piggyback:682.5] Out: 562 [Urine:562] Intake/Output this shift:   Nutritional status: NPO  Past Medical History  Diagnosis Date  . Hypertension   . Diabetes mellitus   . CVA (cerebral vascular accident)   . Stroke     Neurologic Exam:  Mental Status: Intubated, on Propofol, follows no commands.  Cranial Nerves: II: No blink to threat, pupils equal, round, reactive to light and accommodation III,IV, VI: ptosis not present, extra-ocular motions intact dolls, disconjugate gaze at rest. No nystagmus noted V,VII: face symmetric, facial light touch sensation normal bilaterally VIII: hearing normal bilaterally IX,X: gag reflex present XI: bilateral shoulder shrug XII: midline tongue extension Motor: Left arm flaccid, Left leg shows spontaneous movement, right arm shows increase tone tremor that is stimulus induced and fatigues when left alone.  Right leg slightly withdraws with painful stimuli.  Sensory: No response to pain left UE, slightly responds to pain to left foot, right arm does not respond to pain but has tremor that is  stimulus induced. Slight withdrawal to pain with nailbed pressure on right foot.  Deep Tendon Reflexes: 2+ and symmetric throughout Plantars: Right: downgoing   Left: up going  Lab Results: Lab Results  Component Value Date/Time   CHOL  Value: 212  ATP III CLASSIFICATION:  <200     mg/dL   Desirable  161-096  mg/dL   Borderline High  >=045    mg/dL   High       * 07/24/8117  6:45 AM   Lipid Panel No results found for this basename: CHOL, TRIG, HDL, CHOLHDL, VLDL, LDLCALC,  in the last 72 hours  Studies/Results: Ct Head Wo Contrast  05/29/2012  *RADIOLOGY REPORT*  Clinical Data: Seizure and altered mental status.  CT HEAD WITHOUT CONTRAST  Technique:  Contiguous axial images were obtained from the base of the skull through the vertex without contrast.  Comparison: 06/01/2012  Findings: Atrophy, chronic small vessel white matter ischemic changes again noted. Remote left MCA and right basal ganglia infarcts again noted. No acute intracranial abnormalities are identified, including mass lesion or mass effect, hydrocephalus, extra-axial fluid collection, midline shift, hemorrhage, or acute infarction.  The visualized bony calvarium is unremarkable.  IMPRESSION: No evidence of acute intracranial abnormality.  Atrophy, chronic small vessel white matter ischemic changes and remote left MCA and right basal ganglia infarcts.   Original Report Authenticated By: Harmon Pier, M.D.    US Renal Port  06/03/2012   *RADIOLOGY REPORT*  Clinical Data: No acute renal failure.  RENAL/URINARY TRACT ULTRASOUND COMPLETE  Comparison:  None.  Findings:  The study was performed portably.  The patient is intubated on a ventilator.  Right Kidney:  There is possible mild cortical lobularity. No hydronephrosis.  Well-preserved cortex.  Normal size and parenchymal echotexture without focal abnormalities.  Renal length 10.8 cm.  Left Kidney:  There is possible mild cortical lobularity. No hydronephrosis.  Well-preserved  cortex.  Normal size and parenchymal echotexture without focal abnormalities.  Renal length 10.0 cm.  Bladder:  Decompressed by Foley catheter and not visualized.  IMPRESSION: Normal examination.  No hydronephrosis.   Original Report Authenticated By: Carey Bullocks, M.D.    Dg Chest Port 1 View  06/03/2012  *RADIOLOGY REPORT*  Clinical Data: 67 year old male central line placement.  PORTABLE CHEST - 1 VIEW  Comparison: 0158 hours the same day and earlier.  Findings: Semi upright AP portable view 1300 hours.  Right IJ central line now placed.  Tip just below the level of the carina. Endotracheal tube tip is stable.  NG tube is in place but the tip is at the level of the distal esophagus.  The side-hole is near the middle of the mediastinum.  Stable lung volumes.  Stable patchy retrocardiac opacity.  No pneumothorax, pulmonary edema or large effusion.  IMPRESSION: 1.  Right IJ central line tip at the level of the SVC.  No pneumothorax. 2.  Advance NG tube about 10 cm to place the side hole at the level of the stomach. 3. Otherwise, stable lines and tubes. 4.  Stable ventilation.   Original Report Authenticated By: Erskine Speed, M.D.    Dg Chest Port 1 View  06/03/2012  *RADIOLOGY REPORT*  Clinical Data: Respiratory failure  PORTABLE CHEST - 1 VIEW  Comparison: Film at 0200 hours  Findings: Stable positioning of endotracheal tube with tip approximately 2 cm above the carina.  Atelectasis/consolidation of the left lower lobe is stable.  No pulmonary edema.  Stable heart size.  IMPRESSION: Stable atelectasis/consolidation of the left lower lobe.   Original Report Authenticated By: [name], M.D.    Dg Chest Port 1 View  06/03/2012  *RADIOLOGY REPORT*  Clinical Data: Respiratory failure  PORTABLE CHEST - 1 VIEW  Comparison: 05/29/2012  Findings:  Endotracheal tube tip 2.8 cm proximal to the carina.  NG tube descends below the level of the image.  Small left pleural effusion with associated consolidation.   Right lung predominate clear.  Heart size and mediastinal contours unchanged, mildly prominent.  No acute osseous finding.  Curvature of the spine may be accentuated by positioning.  IMPRESSION: Endotracheal tube tip 2.8 cm proximal to the carina.  Small left effusion and associated consolidation; atelectasis versus infiltrate.   Original Report Authenticated By: Jearld Lesch, M.D.    Dg Chest Port 1 View  05/19/2012  *RADIOLOGY REPORT*  Clinical Data: Found unresponsive.  Intubation.  PORTABLE CHEST - 1 VIEW  Comparison: None.  Findings: Endotracheal tube tip is 3-4 cm above the carina in expected position.  NG tube enters the stomach.  Minimal left base density, likely lingular scarring.  Right lung is clear.  Heart is normal size.  Blunting of the left costophrenic angle could reflect scarring or small left effusion.  IMPRESSION: Suspect lingular scarring.  Small left effusion versus pleural thickening/scar.  Endotracheal tube 3-4 cm above the carina.   Original Report Authenticated By: Charlett Nose, M.D.    Dg Abd Portable 1v  06/03/2012  *RADIOLOGY REPORT*  Clinical Data: Gastric tube placement  PORTABLE ABDOMEN - 1 VIEW  Comparison: None  Findings: NG tube is in the body of the stomach.  Bowel gas pattern is normal.  Negative for bowel obstruction.  No acute bony change.  IMPRESSION: Gastric tube is in the body of the stomach.   Original Report Authenticated By: Janeece Riggers, M.D.     MEDICATIONS                                                                                                                        Scheduled: . antiseptic oral rinse  1 application Mouth Rinse QID  . chlorhexidine  15 mL Mouth/Throat BID  . feeding supplement (JEVITY 1.2 CAL)  1,000 mL Per Tube Q24H  . feeding supplement  30 mL Per Tube QID  . heparin subcutaneous  5,000 Units Subcutaneous Q8H  . insulin aspart  0-20 Units Subcutaneous Q4H  . levetiracetam  1,000 mg Intravenous Q12H  . pantoprazole sodium  40  mg Per Tube Daily  . phenytoin (DILANTIN) IV  50 mg Intravenous Q8H  . piperacillin-tazobactam (ZOSYN)  IV  3.375 g Intravenous Q8H  . vancomycin  1,250 mg Intravenous Q24H    ASSESSMENT/PLAN:  67 years old with symptomatic post stroke GTC seizures. Noted to have clinical seizures earlier today, with a pattern suggestive of focal motor seizures that subsequently generalizes. Was loaded with fosphenytoin. Following day Dilantin was checked and corrected level is 29.7.  Will hold next dose. Patient currently intubated and on Propofol, Keppra 1 Gram BID and holding Dilantin due to supra therapeutic level. No further seizure activity  EEG. PENDING  Will follow up with you  Addendum: Nurse turned off sedation and on exam states showed patient held up two fingers with left hand. Opened eyes to voice.   Assessment and plan discussed with with attending physician and they are in agreement.    Felicie Morn PA-C [facility] [phone]  06/04/2012, 8:00 AM   Addendum: Patient was being weaned off Propofol and was noted to have seizure activity.  Propofol restarted.  Patient on Keppra.  1.  Continuous monitoring 2.  Increase Keppra to 1,500 mg q12 hours  Thana Farr, MD [facility] [phone]

## 2012-06-05 ENCOUNTER — Inpatient Hospital Stay (HOSPITAL_COMMUNITY): Payer: PRIVATE HEALTH INSURANCE

## 2012-06-05 DIAGNOSIS — G40401 Other generalized epilepsy and epileptic syndromes, not intractable, with status epilepticus: Secondary | ICD-10-CM

## 2012-06-05 DIAGNOSIS — J690 Pneumonitis due to inhalation of food and vomit: Secondary | ICD-10-CM

## 2012-06-05 LAB — BASIC METABOLIC PANEL
BUN: 37 mg/dL — ABNORMAL HIGH (ref 6–23)
GFR calc Af Amer: 45 mL/min — ABNORMAL LOW (ref >90)
GFR calc non Af Amer: 39 mL/min — ABNORMAL LOW (ref >90)
Potassium: 3.5 mEq/L (ref 3.5–5.1)
Sodium: 145 mEq/L (ref 135–145)

## 2012-06-05 LAB — CBC
Hemoglobin: 10.1 g/dL — ABNORMAL LOW (ref 13.0–17.0)
MCHC: 32.3 g/dL (ref 30.0–36.0)
Platelets: 128 10*3/uL — ABNORMAL LOW (ref 150–400)
RBC: 3.28 MIL/uL — ABNORMAL LOW (ref 4.22–5.81)

## 2012-06-05 LAB — GLUCOSE, CAPILLARY
Glucose-Capillary: 90 mg/dL (ref 70–99)
Glucose-Capillary: 93 mg/dL (ref 70–99)

## 2012-06-05 LAB — CULTURE, RESPIRATORY W GRAM STAIN: Special Requests: NORMAL

## 2012-06-05 MED ORDER — PANTOPRAZOLE SODIUM 40 MG IV SOLR
40.0000 mg | INTRAVENOUS | Status: DC
  Administered 2012-06-05 – 2012-06-09 (×5): 40 mg via INTRAVENOUS
  Filled 2012-06-05 (×4): qty 40

## 2012-06-05 MED ORDER — LORAZEPAM 2 MG/ML IJ SOLN
2.0000 mg | INTRAMUSCULAR | Status: DC | PRN
Start: 2012-06-05 — End: 2012-06-22
  Administered 2012-06-06 – 2012-06-17 (×5): 2 mg via INTRAVENOUS
  Administered 2012-06-18: 4 mg via INTRAVENOUS
  Administered 2012-06-20 – 2012-06-21 (×4): 2 mg via INTRAVENOUS
  Filled 2012-06-05 (×3): qty 1
  Filled 2012-06-05: qty 2
  Filled 2012-06-05 (×6): qty 1

## 2012-06-05 MED ORDER — METOPROLOL TARTRATE 1 MG/ML IV SOLN
5.0000 mg | Freq: Three times a day (TID) | INTRAVENOUS | Status: DC
  Administered 2012-06-05 – 2012-06-07 (×7): 5 mg via INTRAVENOUS
  Filled 2012-06-05 (×10): qty 5

## 2012-06-05 NOTE — Progress Notes (Signed)
Subjective: No further clinical seizures.   Exam: Filed Vitals:   06/05/12 0822  BP: 175/90  Pulse: 97  Temp:   Resp: 20   Gen: In bed, NAD MS: Awakens, follows commands in lower extremities.  ZO:XWRU dysconjugate, right externally deviated compared to left, Pupils reactive bilaterally.  Motor: felxes right arm to pain, moves left side purposefully.  Sensory:responds to nox stim x 4.   Impression: 67 yo M with seizures in the setting of previous stroke.   Recommendations: 1)Continuous EEG pending  2) Would hold sedation, consider extubation from neuro standpoint, if pulmonology agrees from their perspective.  3) Continue keppra. 1500mg  BID.   Ritta Slot, MD [facility] [phone]  If 7pm- 7am, please page neurology on call at [phone].

## 2012-06-05 NOTE — Progress Notes (Signed)
Day 1 LTVM completed

## 2012-06-05 NOTE — Progress Notes (Signed)
PULMONARY  / CRITICAL CARE MEDICINE  Name: Robert Mcclure MRN: [id number] DOB: Feb 24, 1946    ADMISSION DATE:  06-16-12 CONSULTATION DATE: 2012/06/16  REFERRING MD :  EDP  CHIEF COMPLAINT:  Resp Failure/Seizure   BRIEF PATIENT DESCRIPTION:  67 yo male admitted with status epilepticus.  Intubated for airway protection. PCCM asked to admit.    Significant PMHx of vascular dementia, seizures, HTN, DM, CVA, Gout  SIGNIFICANT EVENTS: 2/16 Seizure, VDRF, admit to ICU 2/18 Generalized seizure with WUA, continuous EEG started  STUDIES:  2/15 CT head >> Remote Lt MCA and Rt basal ganglia infarcts, chronic small vessel white matter ischemic changes 2/16 CT head >> no change 2/17 EEG >> background slowing, no epileptiform activity 2/17 Renal u/s >> no hydronephrosis  LINES / TUBES: 2/16 ETT >>2/19 2/17 Rt IJ CVL >>  CULTURES: 2/16 BC x 2 >> 2/16 UC >> negative 2/17 Sputum >> 2/17 Pneumococcal Ag >> negative 2/17 Legionella Ag >> negative  ANTIBIOTICS: 2/16 Vancomycin >> 2/16 Zosyn >>   SUBJECTIVE:  Alert.  Tolerating SBT.  No further seizure activity.  VITAL SIGNS: Temp:  [97.6 F (36.4 C)-99.6 F (37.6 C)] 97.8 F (36.6 C) (02/19 0355) Pulse Rate:  [55-112] 97 (02/19 0822) Resp:  [13-20] 20 (02/19 0822) BP: (99-183)/(61-108) 175/90 mmHg (02/19 0822) SpO2:  [98 %-100 %] 100 % (02/19 0822) FiO2 (%):  [30 %-40 %] 30 % (02/19 0822) Weight:  [208 lb 8.9 oz (94.6 kg)] 208 lb 8.9 oz (94.6 kg) (02/19 0500) VENTILATOR SETTINGS: Vent Mode:  [-] PRVC FiO2 (%):  [30 %-40 %] 30 % Set Rate:  [14 bmp] 14 bmp Vt Set:  [600 mL] 600 mL PEEP:  [5 cmH20] 5 cmH20 Plateau Pressure:  [15 cmH20-20 cmH20] 19 cmH20 INTAKE / OUTPUT: Intake/Output     02/18 0701 - 02/19 0700 02/19 0701 - 02/20 0700   P.O. 100    I.V. (mL/kg) 1626.4 (17.2)    Other 120    NG/GT 1140    IV Piggyback 730    Total Intake(mL/kg) 3716.4 (39.3)    Urine (mL/kg/hr) 898 (0.4)    Total Output 898     Net  +2818.4          Stool Occurrence 3 x      PHYSICAL EXAMINATION: General: No distress Neuro:  Alert HEENT:  ETT in place Cardiovascular:  s1s2 regular, no murmur Lungs:  Decreased breath sounds Lt base Abdomen:  Soft , non tender Musculoskeletal:  No edema Skin:  No rash   LABS:  Recent Labs Lab Jun 16, 2012 1152 2012/06/16 1419 06/03/12 0137 06/03/12 0138 06/03/12 0230 06/03/12 0420 06/03/12 1626 06/04/12 0500 06/05/12 0522  HGB 13.2  --  12.4*  --   --   --   --  10.4* 10.1*  WBC 10.1  --  8.6  --   --   --   --  7.6 5.3  PLT 226  --  148*  --   --   --   --  120* 128*  NA 151*  --  148*  --   --  147*  --  145 145  K 4.4  --  3.5  --   --  3.9  --  3.6 3.5  CL 107  --  113*  --   --  113*  --  109 111  CO2 10*  --  21  --   --  19  --  24 24  GLUCOSE 282*  --  135*  --   --  141*  --  133* 136*  BUN 21  --  27*  --   --  28*  --  35* 37*  CREATININE 1.72*  --  2.31*  --   --  2.34*  --  2.15* 1.75*  CALCIUM 9.6  --  8.1*  --   --  8.0*  --  7.4* 7.5*  MG  --   --  2.0  --   --  1.9  --   --   --   PHOS  --   --   --   --   --  5.1*  --   --   --   AST 33  --  44*  --   --   --   --   --   --   ALT 16  --  16  --   --   --   --   --   --   ALKPHOS 58  --  43  --   --   --   --   --   --   BILITOT 0.4  --  0.5  --   --   --   --   --   --   PROT 8.1  --  6.3  --   --   --   --   --   --   ALBUMIN 3.8  --  2.9*  --   --   --  2.9* 2.7*  --   APTT 33  --   --   --   --   --   --   --   --   INR 1.29  --   --   --   --   --   --   --   --   LATICACIDVEN 21.2*  --   --  1.5  --   --   --   --   --   TROPONINI <0.30  --   --   --   --   --   --   --   --   PHART  --  7.360  --   --  7.437  --   --   --   --   PCO2ART  --  34.6*  --   --  31.6*  --   --   --   --   PO2ART  --  140.0*  --   --  128.0*  --   --   --   --     Recent Labs Lab 06/04/12 1140 06/04/12 1553 06/04/12 1941 06/04/12 2351 06/05/12 0354  GLUCAP 127* 113* 105* 154* 145*    Imaging: US Renal  Port  06/03/2012   *RADIOLOGY REPORT*  Clinical Data: No acute renal failure.  RENAL/URINARY TRACT ULTRASOUND COMPLETE  Comparison:  None.  Findings:  The study was performed portably.  The patient is intubated on a ventilator.  Right Kidney:  There is possible mild cortical lobularity. No hydronephrosis.  Well-preserved cortex.  Normal size and parenchymal echotexture without focal abnormalities.  Renal length 10.8 cm.  Left Kidney:  There is possible mild cortical lobularity. No hydronephrosis.  Well-preserved cortex.  Normal size and parenchymal echotexture without focal abnormalities.  Renal length 10.0 cm.  Bladder:  Decompressed by Foley catheter and not visualized.  IMPRESSION: Normal examination.  No hydronephrosis.   Original Report Authenticated By: Chrissie Noa  Purcell Mouton, M.D.    Dg Chest Port 1 View  06/05/2012  *RADIOLOGY REPORT*  Clinical Data: pneumonia  PORTABLE CHEST - 1 VIEW  Comparison: 06/04/2012  Findings: Endotracheal tube, NG tube, right internal jugular vein center venous catheter are stable.  Bibasilar atelectasis verses airspace disease is stable.  Low volumes.  No pneumothorax.  IMPRESSION: Stable bibasilar atelectasis verses airspace disease.   Original Report Authenticated By: Jolaine Click, M.D.    Dg Chest Port 1 View  06/04/2012  *RADIOLOGY REPORT*  Clinical Data: Pneumonia.  PORTABLE CHEST - 1 VIEW  Comparison: Chest x-ray 06/03/2012.  Findings: An endotracheal tube is in place with tip 3.0 cm above the carina. Lung volumes are low.  There is a right-sided internal jugular central venous catheter with tip terminating in the distal superior vena cava.  Nasogastric tube is seen extending into the proximal stomach.  Bibasilar opacities (left greater than right), favored to reflect predominately subsegmental atelectasis, however, some underlying airspace consolidation in the left lower lobe is difficult to exclude.  Small left pleural effusion.  Pulmonary vasculature is within normal  limits.  Heart size is upper limits of normal. The patient is rotated to the left  on today's exam, resulting in distortion of the mediastinal contours and reduced diagnostic sensitivity and specificity for mediastinal pathology. Atherosclerosis in the thoracic aorta.  IMPRESSION: 1.  Support apparatus, as above. 2.  Persistent bibasilar areas of atelectasis with possible superimposed airspace consolidation in the left lower lobe which could reflect pneumonia or sequelae of aspiration, in addition to a small left-sided pleural effusion. 3.  Atherosclerosis.   Original Report Authenticated By: Trudie Reed, M.D.    Dg Chest Port 1 View  06/03/2012  *RADIOLOGY REPORT*  Clinical Data: 67 year old male central line placement.  PORTABLE CHEST - 1 VIEW  Comparison: 0158 hours the same day and earlier.  Findings: Semi upright AP portable view 1300 hours.  Right IJ central line now placed.  Tip just below the level of the carina. Endotracheal tube tip is stable.  NG tube is in place but the tip is at the level of the distal esophagus.  The side-hole is near the middle of the mediastinum.  Stable lung volumes.  Stable patchy retrocardiac opacity.  No pneumothorax, pulmonary edema or large effusion.  IMPRESSION: 1.  Right IJ central line tip at the level of the SVC.  No pneumothorax. 2.  Advance NG tube about 10 cm to place the side hole at the level of the stomach. 3. Otherwise, stable lines and tubes. 4.  Stable ventilation.   Original Report Authenticated By: Erskine Speed, M.D.    Dg Abd Portable 1v  06/03/2012  *RADIOLOGY REPORT*  Clinical Data: Gastric tube placement  PORTABLE ABDOMEN - 1 VIEW  Comparison: None  Findings: NG tube is in the body of the stomach.  Bowel gas pattern is normal.  Negative for bowel obstruction.  No acute bony change.  IMPRESSION: Gastric tube is in the body of the stomach.   Original Report Authenticated By: Janeece Riggers, M.D.       ASSESSMENT / PLAN:  NEUROLOGIC A:  Hx of  Seizure disorder s/p status epilepticus >> recurrent seizure with WUA 2/18.  Continuous EEG started 2/18. Hx of CVA. P:   AED's per neurology  PULMONARY A:  Acute respiratory failure 2nd to seizure and aspiration. P:   Proceed with extubation 2/19 F/u CXR intermittently Bronchial hygiene  CARDIOVASCULAR A: Hx of HTN. P:  Lopressor 5 mg  IV q8h >> transition to oral meds when able to swallow  RENAL A:  Acute renal failure >> improving. P:   Continue 1/2 NS at 50 ml/hr Monitor renal fx, urine outpt, electrolytes  GASTROINTESTINAL A:  Nutrition. Dysphagia. P:   Speech to assess swallowing after extubation Protonix for SUP  HEMATOLOGIC A:  Anemia of critical illness. P:  F/u CBC SQ heparin for DVT prevention  INFECTIOUS A:  Aspiration pneumonia. P:   D3/x vancomycin, zosyn >> narrow Abx once cultures final  ENDOCRINE A:  DM type II. Hx of gout. P:   SSI Hold allopurinol  Resolved problems >> Hypernatremia.  CC time 35 minutes.    Coralyn Helling, MD [facility] Pulmonary/Critical Care 06/05/2012, 8:29 AM Pager:  [phone] After 3pm call: [phone]

## 2012-06-05 NOTE — Procedures (Addendum)
History: 67 yo M admitted for seizures, had recurrent seizure yesterday.   This prlonged EEG recorded from 1:42pm on 06/04/12 until 9:19 am on 06/05/12  Background: There is a well defined posterior dominant rhythm of 9.5 Hz that attenuates with eye opening. There is a mild attenuation of faster frequencies on the left compared to the right. Throughout the recording, even during wakefull states there is mild generalized irregular delta activity seen. Occasionally, associated with drowsiness, runs of frontally prediminant intermittent rhythmic delta activity(FIRDA) is seen. Rare sharp waves are seen at T7, P7, F7 associated mostly with drowsiness and early sleep.   EEG abnormalities:  1) Left anterior temporal sharp waves 2) Attenuation of faster frequencies on the left.  3) Generalized irregular delta activity  Clinical Interpretation: This EEG is consistent with an epileptogenic zone in the left anterior temporal region. There is also suggestion of a left cortical dysfunction(consistent with previous infarct) superimposed on a mild non-specific generalized cerebral dysfunction(encephalopathy).  There was no seizure recorded  on this study.   Ritta Slot, MD [facility] [phone]  If 7pm- 7am, please page neurology on call at [phone].

## 2012-06-05 NOTE — Procedures (Signed)
Extubation Procedure Note  Patient Details:   Name: Robert Mcclure DOB: Nov 17, 1945 MRN: [id number]    Pt extubated to [facility] after successful SBT.  Pt able to vocalize. Pt will not cough on demand.  Will continue to monitor.    Evaluation  O2 sats: stable throughout Complications: No apparent complications Patient did tolerate procedure well. Bilateral Breath Sounds: Clear;Diminished Suctioning: Airway Yes  Cora Stetson Apple 06/05/2012, 8:45 AM

## 2012-06-06 LAB — BASIC METABOLIC PANEL
CO2: 22 mEq/L (ref 19–32)
Calcium: 7.9 mg/dL — ABNORMAL LOW (ref 8.4–10.5)
Chloride: 114 mEq/L — ABNORMAL HIGH (ref 96–112)
Creatinine, Ser: 1.33 mg/dL (ref 0.50–1.35)
GFR calc Af Amer: 62 mL/min — ABNORMAL LOW (ref >90)
Sodium: 149 mEq/L — ABNORMAL HIGH (ref 135–145)

## 2012-06-06 LAB — GLUCOSE, CAPILLARY
Glucose-Capillary: 86 mg/dL (ref 70–99)
Glucose-Capillary: 86 mg/dL (ref 70–99)
Glucose-Capillary: 90 mg/dL (ref 70–99)
Glucose-Capillary: 84 mg/dL (ref 70–99)
Glucose-Capillary: 90 mg/dL (ref 70–99)

## 2012-06-06 LAB — CBC
MCV: 94.3 fL (ref 78.0–100.0)
Platelets: 135 10*3/uL — ABNORMAL LOW (ref 150–400)
RBC: 3.34 MIL/uL — ABNORMAL LOW (ref 4.22–5.81)
RDW: 14.6 % (ref 11.5–15.5)
WBC: 5.3 10*3/uL (ref 4.0–10.5)

## 2012-06-06 MED ORDER — DEXTROSE 5 % IV SOLN
1.0000 g | INTRAVENOUS | Status: DC
  Administered 2012-06-06 – 2012-06-08 (×3): 1 g via INTRAVENOUS
  Filled 2012-06-06 (×4): qty 10

## 2012-06-06 NOTE — Evaluation (Signed)
Clinical/Bedside Swallow Evaluation Patient Details  Name: Robert Mcclure MRN: [id number] Date of Birth: 06-22-1945  Today's Date: 06/06/2012 Time: 1155-     Past Medical History:  Past Medical History  Diagnosis Date  . Hypertension   . Diabetes mellitus   . CVA (cerebral vascular accident)   . Stroke    Past Surgical History: History reviewed. No pertinent past surgical history. HPI:  67 yo male admitted with status epilepticus. Intubated 2/16-2/19. Significant PMHx of vascular dementia, seizures, HTN, DM, CVA (Remote Lt MCA and Rt basal ganglia infarcts) , Gout.  Remote hx of dysphagia s/p CVA with resolution.  Assessment / Plan / Recommendation Clinical Impression  Pt presents with dysphagia s/p seizure with poor MS, bolus awareness, inattention. Wet phonation at baseline; appears to be having difficulty managing secretions. Swallow response is delayed. There are multiple swallows noted per small, single boluses, suggesting weakness.  Palpable/wet congestion is noted at level of larynx s/p swallows of water and ice chips.  Pt with  compromised airway protection at this time.  High aspiration risk.  Recommend continuing NPO status for now except meds to be given with purees.  SLP to follow for improved mentation and tolerance of POs.       Aspiration Risk  Severe    Diet Recommendation NPO except meds   Medication Administration: Whole meds with puree    Other  Recommendations Oral Care Recommendations: Oral care QID   Follow Up Recommendations       Frequency and Duration min 3x week  2 weeks   Pertinent Vitals/Pain Unable to state    SLP Swallow Goals Patient will utilize recommended strategies during swallow to increase swallowing safety with: Minimal assistance Goal #3: Pt will participate in PO trials with sufficient attention to bolus 80% of trials.   Swallow Study Prior Functional Status       General Date of Onset: 05/31/2012 HPI: 68 yo male admitted  with status epilepticus. Intubated 2/16-2/19. Significant PMHx of vascular dementia, seizures, HTN, DM, CVA (Remote Lt MCA and Rt basal ganglia infarcts) , Gout Type of Study: Bedside swallow evaluation Previous Swallow Assessment: 05/21/10: MBS - results not available Diet Prior to this Study: NPO Temperature Spikes Noted: No Respiratory Status: Room air History of Recent Intubation: Yes Length of Intubations (days): 3 days Date extubated: 06/05/12 Behavior/Cognition: Alert;Confused;Doesn't follow directions;Decreased sustained attention Oral Cavity - Dentition: Edentulous Self-Feeding Abilities: Needs assist Patient Positioning: Upright in bed Baseline Vocal Quality: Wet Volitional Cough: Cognitively unable to elicit Volitional Swallow: Unable to elicit    Oral/Motor/Sensory Function Overall Oral Motor/Sensory Function: Appears within functional limits for tasks assessed   Ice Chips Ice chips: Impaired Presentation: Spoon Pharyngeal Phase Impairments: Wet Vocal Quality;Suspected delayed Swallow   Thin Liquid Thin Liquid: Impaired Presentation: Cup;Spoon Oral Phase Impairments: Poor awareness of bolus Pharyngeal  Phase Impairments: Suspected delayed Swallow;Multiple swallows;Wet Vocal Quality;Throat Clearing - Immediate    Nectar Thick Nectar Thick Liquid: Not tested   Honey Thick Honey Thick Liquid: Not tested   Puree Puree: Impaired Presentation: Spoon Pharyngeal Phase Impairments: Multiple swallows;Suspected delayed Swallow   Solid   GO    Solid: Not tested      Marchelle Folks L. Samson Frederic, [place] CCC/SLP Pager [phone]  Blenda Mounts Laurice 06/06/2012,12:18 PM

## 2012-06-06 NOTE — Progress Notes (Signed)
PULMONARY  / CRITICAL CARE MEDICINE  Name: Robert Mcclure MRN: [id number] DOB: Aug 13, 1945    ADMISSION DATE:  05/24/2012 CONSULTATION DATE: 05/27/2012  REFERRING MD :  EDP  CHIEF COMPLAINT:  Resp Failure/Seizure   BRIEF PATIENT DESCRIPTION:  67 yo male admitted with status epilepticus.  Intubated for airway protection. PCCM asked to admit.    Significant PMHx of vascular dementia, seizures, HTN, DM, CVA, Gout  SIGNIFICANT EVENTS: 2/16 Seizure, VDRF, admit to ICU 2/18 Generalized seizure with WUA, continuous EEG started  STUDIES:  2/15 CT head >> Remote Lt MCA and Rt basal ganglia infarcts, chronic small vessel white matter ischemic changes 2/16 CT head >> no change 2/17 EEG >> background slowing, no epileptiform activity 2/17 Renal u/s >> no hydronephrosis  LINES / TUBES: 2/16 ETT >>2/19 2/17 Rt IJ CVL >> 2/19  CULTURES: 2/16 BC x 2 >> 2/16 UC >> negative 2/17 Sputum >> oral flora 2/17 Pneumococcal Ag >> negative 2/17 Legionella Ag >> negative  ANTIBIOTICS: 2/16 Vancomycin >> 2/20 2/16 Zosyn >> 2/20  SUBJECTIVE:  Denies headache, dyspnea, chest pain, or abdominal pain.  VITAL SIGNS: Temp:  [97.8 F (36.6 C)-99.3 F (37.4 C)] 97.9 F (36.6 C) (02/20 0800) Pulse Rate:  [85-100] 85 (02/20 0800) Resp:  [20-26] 20 (02/20 0800) BP: (148-194)/(77-95) 194/92 mmHg (02/20 0800) SpO2:  [92 %-96 %] 96 % (02/20 0800) Weight:  [207 lb 10.8 oz (94.2 kg)] 207 lb 10.8 oz (94.2 kg) (02/20 0438) INTAKE / OUTPUT: Intake/Output     02/19 0701 - 02/20 0700 02/20 0701 - 02/21 0700   P.O.     I.V. (mL/kg) 600 (6.4)    Other     NG/GT     IV Piggyback 630    Total Intake(mL/kg) 1230 (13.1)    Urine (mL/kg/hr) 1250 (0.6)    Total Output 1250     Net -20          Urine Occurrence 4 x    Stool Occurrence 4 x      PHYSICAL EXAMINATION: General: No distress Neuro:  Alert HEENT:  No sinus tenderness Cardiovascular:  s1s2 regular, no murmur Lungs:  Decreased breath  sounds Lt base Abdomen:  Soft , non tender Musculoskeletal:  No edema Skin:  No rash   LABS:  Recent Labs Lab 05/30/2012 1152 05/26/2012 1419 06/03/12 0137 06/03/12 0138 06/03/12 0230 06/03/12 0420 06/03/12 1626 06/04/12 0500 06/05/12 0522 06/06/12 0425  HGB 13.2  --  12.4*  --   --   --   --  10.4* 10.1* 10.3*  WBC 10.1  --  8.6  --   --   --   --  7.6 5.3 5.3  PLT 226  --  148*  --   --   --   --  120* 128* 135*  NA 151*  --  148*  --   --  147*  --  145 145 149*  K 4.4  --  3.5  --   --  3.9  --  3.6 3.5 3.5  CL 107  --  113*  --   --  113*  --  109 111 114*  CO2 10*  --  21  --   --  19  --  24 24 22   GLUCOSE 282*  --  135*  --   --  141*  --  133* 136* 92  BUN 21  --  27*  --   --  28*  --  35* 37* 22  CREATININE 1.72*  --  2.31*  --   --  2.34*  --  2.15* 1.75* 1.33  CALCIUM 9.6  --  8.1*  --   --  8.0*  --  7.4* 7.5* 7.9*  MG  --   --  2.0  --   --  1.9  --   --   --   --   PHOS  --   --   --   --   --  5.1*  --   --   --   --   AST 33  --  44*  --   --   --   --   --   --   --   ALT 16  --  16  --   --   --   --   --   --   --   ALKPHOS 58  --  43  --   --   --   --   --   --   --   BILITOT 0.4  --  0.5  --   --   --   --   --   --   --   PROT 8.1  --  6.3  --   --   --   --   --   --   --   ALBUMIN 3.8  --  2.9*  --   --   --  2.9* 2.7*  --   --   APTT 33  --   --   --   --   --   --   --   --   --   INR 1.29  --   --   --   --   --   --   --   --   --   LATICACIDVEN 21.2*  --   --  1.5  --   --   --   --   --   --   TROPONINI <0.30  --   --   --   --   --   --   --   --   --   PHART  --  7.360  --   --  7.437  --   --   --   --   --   PCO2ART  --  34.6*  --   --  31.6*  --   --   --   --   --   PO2ART  --  140.0*  --   --  128.0*  --   --   --   --   --     Recent Labs Lab 06/05/12 1626 06/05/12 2004 06/06/12 0041 06/06/12 0415 06/06/12 0823  GLUCAP 93 81 86 86 90    Imaging: Dg Chest Port 1 View  06/05/2012  *RADIOLOGY REPORT*  Clinical Data:  pneumonia  PORTABLE CHEST - 1 VIEW  Comparison: 06/04/2012  Findings: Endotracheal tube, NG tube, right internal jugular vein center venous catheter are stable.  Bibasilar atelectasis verses airspace disease is stable.  Low volumes.  No pneumothorax.  IMPRESSION: Stable bibasilar atelectasis verses airspace disease.   Original Report Authenticated By: Jolaine Click, M.D.       ASSESSMENT / PLAN:  NEUROLOGIC A:  Hx of Seizure disorder s/p status epilepticus >> recurrent seizure with WUA 2/18.  Continuous EEG started 2/18. Hx of CVA. P:  Keppra 1500 mg BID >> will need to f/u with his outpt neurologist PT/OT evaluation  PULMONARY A:  Acute respiratory failure 2nd to seizure and aspiration. P:   F/u CXR intermittently Bronchial hygiene  CARDIOVASCULAR A: Hx of HTN. P:  Lopressor 5 mg IV q8h >> transition to oral meds when able to swallow  RENAL A:  Acute renal failure >> resolved. P:   D/c IV fluids once able to take oral feedings Monitor renal fx, urine outpt, electrolytes  GASTROINTESTINAL A:  Nutrition. Dysphagia. P:   Speech to assess swallowing Protonix for SUP  HEMATOLOGIC A:  Anemia of critical illness. Thrombocytopenia. P:  F/u CBC SQ heparin for DVT prevention  INFECTIOUS A:  Aspiration pneumonia. P:   D3/x Abx >> change to rocephin 2/20  ENDOCRINE A:  DM type II. Hx of gout. P:   SSI Hold allopurinol  Resolved problems >> Hypernatremia.  Updated family at bedside.  Will transfer to Telemetry.  Pt followed by Dr. Jeri Cos as outpt.  D/w Dr. Bascom Levels >> he has requested that [facility] patient will on medical floor.  Will transfer service to [facility] for 2/21 and PCCM sign off.   Coralyn Helling, MD [facility] Pulmonary/Critical Care 06/06/2012, 10:00 AM Pager:  [phone] After 3pm call: [phone]

## 2012-06-06 NOTE — Progress Notes (Signed)
UR completed 

## 2012-06-06 NOTE — Progress Notes (Signed)
At shift change i noticed that pt had pulled central line out about 6 inches. I finished removing it.

## 2012-06-06 NOTE — Progress Notes (Signed)
Subjective: No further clinical seizures.   Exam: Filed Vitals:   06/06/12 0800  BP: 194/92  Pulse: 85  Temp: 97.9 F (36.6 C)  Resp: 20   Gen: In bed, NAD MS: Awakens, oriented to hospital, but not which hospital.  ZO:XWRU dysconjugate, right externally deviated compared to left, Pupils reactive bilaterally. Responds to visual stimuli in both hemifields.   Motor: flexes right arm to command(2/5 strength), moves left side purposefully thoguh has some grip weakness.  Sensory: endorses sensation x 4.   Impression: 67 yo M with seizures in the setting of previous stroke. He is currently extubated and states that he feels that he is at baseline. He has a history of dementia, and I suspect his mild disorientation is due to this.   Recommendations:  1) Continue keppra 1500mg  BID, would continue this dose at discharge.  2) At this time, neurology will sign off, please call with further questions.   Ritta Slot, MD [facility] [phone]  If 7pm- 7am, please page neurology on call at [phone].

## 2012-06-06 NOTE — Procedures (Signed)
History: 67 yo M presented with seizures in the setting of previous stroke.   This EEG recorded from  02/19 09:37 am to 2/19 3pm   Background: There is a  posterior dominant rhythm of 9.5 Hz. The background consists of intermixed alpha, beta and irregular generalized delta activity. There is mild attenuation of faster frequencies on the left. Rarely, Frontally predominant intermittent rhythmic delta activity(FIRDA) is seen.   Photic stimulation: Physiologic driving is not performed  EEG abnormalities: 1) Attenuation of faster frequencies on the left.  2) FIRDA 3) Generalized irregular delta activity   Clinical Interpretation: This abnormal  EEG is recorded in the waking state. It is consistent with a focal left hemispheric cortical dysfunction(consistent with previous stroke) in the setting of a mild nonspecific generalized cerebral dysfunction. There was no seizure or seizure predisposition recorded on this study.   Ritta Slot, MD [facility] [phone]  If 7pm- 7am, please page neurology on call at [phone].

## 2012-06-07 ENCOUNTER — Inpatient Hospital Stay (HOSPITAL_COMMUNITY): Payer: PRIVATE HEALTH INSURANCE

## 2012-06-07 LAB — GLUCOSE, CAPILLARY
Glucose-Capillary: 109 mg/dL — ABNORMAL HIGH (ref 70–99)
Glucose-Capillary: 105 mg/dL — ABNORMAL HIGH (ref 70–99)

## 2012-06-07 LAB — BASIC METABOLIC PANEL
BUN: 17 mg/dL (ref 6–23)
Calcium: 8.3 mg/dL — ABNORMAL LOW (ref 8.4–10.5)
Creatinine, Ser: 1.12 mg/dL (ref 0.50–1.35)
GFR calc Af Amer: 77 mL/min — ABNORMAL LOW (ref >90)
GFR calc non Af Amer: 66 mL/min — ABNORMAL LOW (ref >90)
Potassium: 3.5 mEq/L (ref 3.5–5.1)

## 2012-06-07 LAB — CBC
Hemoglobin: 10.7 g/dL — ABNORMAL LOW (ref 13.0–17.0)
MCHC: 32.7 g/dL (ref 30.0–36.0)
Platelets: 154 10*3/uL (ref 150–400)
RDW: 14.4 % (ref 11.5–15.5)

## 2012-06-07 MED ORDER — PANTOPRAZOLE SODIUM 40 MG PO TBEC: 40.0000 mg | DELAYED_RELEASE_TABLET | Freq: Every day | ORAL | Status: DC

## 2012-06-07 MED ORDER — HYDRALAZINE HCL 20 MG/ML IJ SOLN
10.0000 mg | Freq: Three times a day (TID) | INTRAMUSCULAR | Status: DC | PRN
  Administered 2012-06-07 – 2012-06-11 (×7): 10 mg via INTRAVENOUS
  Filled 2012-06-07 (×6): qty 1

## 2012-06-07 MED ORDER — METOPROLOL TARTRATE 100 MG PO TABS
100.0000 mg | ORAL_TABLET | Freq: Two times a day (BID) | ORAL | Status: DC
  Administered 2012-06-07 – 2012-06-10 (×7): 100 mg via ORAL
  Filled 2012-06-07 (×15): qty 1

## 2012-06-07 MED ORDER — DEXTROSE-NACL 5-0.9 % IV SOLN
INTRAVENOUS | Status: DC
  Administered 2012-06-07 – 2012-06-08 (×2): via INTRAVENOUS

## 2012-06-07 MED ORDER — FUROSEMIDE 10 MG/ML IJ SOLN
40.0000 mg | Freq: Once | INTRAMUSCULAR | Status: AC
  Administered 2012-06-07: 40 mg via INTRAVENOUS
  Filled 2012-06-07: qty 4

## 2012-06-07 MED ORDER — HYDRALAZINE HCL 20 MG/ML IJ SOLN
10.0000 mg | Freq: Once | INTRAMUSCULAR | Status: AC
  Administered 2012-06-08: 10 mg via INTRAVENOUS
  Filled 2012-06-07: qty 1

## 2012-06-07 MED ORDER — HYDRALAZINE HCL 25 MG PO TABS
25.0000 mg | ORAL_TABLET | Freq: Three times a day (TID) | ORAL | Status: DC
  Administered 2012-06-07 – 2012-06-09 (×6): 25 mg via ORAL
  Filled 2012-06-07 (×11): qty 1

## 2012-06-07 MED ORDER — CLONIDINE HCL 0.3 MG PO TABS
0.3000 mg | ORAL_TABLET | Freq: Three times a day (TID) | ORAL | Status: DC
  Administered 2012-06-07 – 2012-06-09 (×8): 0.3 mg via ORAL
  Filled 2012-06-07 (×12): qty 1

## 2012-06-07 NOTE — Progress Notes (Signed)
SLP Cancellation Note  Patient Details Name: Robert Mcclure MRN: [id number] DOB: 1945/05/29   Cancelled treatment:        MBS was scheduled for this afternoon, but was cancelled per Nursing due to patient's high BP.  MD, please re-order MBS when pt. Is able to leave the unit and travel to Radiology.   Maryjo Rochester T 06/07/2012, 1:39 PM

## 2012-06-07 NOTE — Progress Notes (Signed)
Pt continuing to have elevated BP 191/106 @ this time. PRN hydralazine and labetalol given. HS meds for BP given. no change in BP. Benedetto Coons notified.

## 2012-06-07 NOTE — Progress Notes (Signed)
NUTRITION FOLLOW UP  Intervention:    If EN warranted, recommend Jevity 1.2 formula ---> initiate at 20 ml/hr and increase by 10 ml every 4 hours to goal rate of 70 ml/hr with Prostat liquid protein 30 ml daily to provide 2116 total kcals, 108 gm protein, 1356 ml of free water RD to follow for nutrition care plan  Nutrition Dx:   Inadequate oral intake related to inability to eat as evidenced by NPO status, ongoing  Goal:   Oral intake vs EN support to meet >/= 90% of estimated nutrition needs, currently unmet  Monitor:   PO diet advancement, EN initiation, weight, labs, I/O's  Assessment:   Patient extubated 2/19.  Bedside swallow evaluation completed 2/20, MBSS for today for safest diet consistency given sluggish motor response and MS deficits.  May need short-term EN support during hospitalization.  Height: Ht Readings from Last 1 Encounters:  05/20/2012 6' (1.829 m)    Weight Status:   Wt Readings from Last 1 Encounters:  06/07/12 206 lb 8 oz (93.668 kg)    Body mass index is 28 kg/(m^2).  Re-estimated needs:  Kcal: 2000-2200 Protein: 105-115 gm Fluid: 2.0-2.2 L  Skin: Intact  Diet Order: NPO  Last BM: 2/20  Labs:   Recent Labs Lab 06/10/2012 1152 06/03/12 0137 06/03/12 0420  06/05/12 0522 06/06/12 0425 06/07/12 0602  NA 151* 148* 147*  < > 145 149* 148*  K 4.4 3.5 3.9  < > 3.5 3.5 3.5  CL 107 113* 113*  < > 111 114* 113*  CO2 10* 21 19  < > 24 22 21   BUN 21 27* 28*  < > 37* 22 17  CREATININE 1.72* 2.31* 2.34*  < > 1.75* 1.33 1.12  CALCIUM 9.6 8.1* 8.0*  < > 7.5* 7.9* 8.3*  MG  --  2.0 1.9  --   --   --   --   PHOS  --   --  5.1*  --   --   --   --   GLUCOSE 282* 135* 141*  < > 136* 92 102*  < > = values in this interval not displayed.  CBG (last 3)   Recent Labs  06/06/12 2002 06/07/12 0010 06/07/12 0406  GLUCAP 90 91 91    Scheduled Meds: . antiseptic oral rinse  1 application Mouth Rinse QID  . cefTRIAXone (ROCEPHIN)  IV  1 g Intravenous  Q24H  . chlorhexidine  15 mL Mouth/Throat BID  . heparin subcutaneous  5,000 Units Subcutaneous Q8H  . insulin aspart  0-20 Units Subcutaneous Q4H  . levETIRAcetam  1,500 mg Intravenous Q12H  . metoprolol  5 mg Intravenous Q8H  . pantoprazole (PROTONIX) IV  40 mg Intravenous Q24H    Continuous Infusions: . sodium chloride 50 mL/hr at 06/06/12 1100    Maureen Chatters, RD, LDN Pager #: [phone] After-Hours Pager #: [phone]

## 2012-06-07 NOTE — Progress Notes (Signed)
  OT Cancellation Note  Patient Details Name: Robert Mcclure MRN: [id number] DOB: 1945-09-09   Cancelled Treatment:    Reason Eval/Treat Not Completed: Patient not medically ready (pending transfer to step down due to uncontrolled BP)  Harrel Carina [facility] 06/07/2012, 2:01 PM

## 2012-06-07 NOTE — Progress Notes (Addendum)
Speech Language Pathology Dysphagia Treatment Patient Details Name: Robert Mcclure MRN: [id number] DOB: May 21, 1945 Today's Date: 06/07/2012 Time: 0865-7846 SLP Time Calculation (min): 10 min  Assessment / Plan / Recommendation Clinical Impression  F/u after yesterday's clinical swallow evaluation:  Pt's MS continues to impact attention, ability to f/c, and ability to fully protect airway.  Demonstrates improved ability to manage secretions and better toleration of purees today, despite continued delays/weakness.    Recommend proceeding with MBS to determine diet consistency that will be safest given sluggish motor response and MS deficits.  BP high this am; pt is scheduled for CXR - will contact MD re: appropriateness for study today -if unable, may need to consider temporary enteral feeding.    Diet Recommendation    NPO except meds with puree   SLP Plan MBS   Pertinent Vitals/Pain No indication of pain   Swallowing Goals  SLP Swallowing Goals Goal #3: Pt will participate in PO trials with sufficient attention to bolus 80% of trials. Swallow Study Goal #3 - Progress: Progressing toward goal  General Temperature Spikes Noted: No Respiratory Status: Room air Behavior/Cognition: Alert;Confused;Doesn't follow directions;Decreased sustained attention Oral Cavity - Dentition: Edentulous Patient Positioning: Upright in bed  Oral Cavity - Oral Hygiene Patient is HIGH RISK - Oral Care Protocol followed (see row info): Yes   Dysphagia Treatment Treatment focused on: Skilled observation of diet tolerance;Facilitation of pharyngeal phase Treatment Methods/Modalities: Skilled observation;Differential diagnosis Patient observed directly with PO's: Yes Type of PO's observed: Dysphagia 1 (puree) Feeding: Needs assist Pharyngeal Phase Signs & Symptoms: Suspected delayed swallow initiation;Multiple swallows;Wet vocal quality Type of cueing: Verbal Amount of cueing: Moderate   Najat Olazabal  L. Samson Frederic, [place] CCC/SLP Pager [phone]      Blenda Mounts Laurice 06/07/2012, 10:02 AM

## 2012-06-07 NOTE — Progress Notes (Signed)
PTOT Cancellation Note  Patient Details Name: NORMAN PIACENTINI MRN: [id number] DOB: 1945-11-07   Cancelled Treatment:    Reason Eval/Treat Not Completed: Medical issues which prohibited therapy.  Pt being moved to step-down due to uncontrolled BP's.  Will defer evaluation today and complete as able 2/22. 06/07/2012  Handley Bing, PT [phone] (pager)    Lukah Goswami, Eliseo Gum 06/07/2012, 12:20 PM

## 2012-06-07 NOTE — Progress Notes (Signed)
[facility] PROGRESS NOTE  MURRAY DURRELL ZOX:[id number] DOB: 1945/11/29 DOA: 05/19/2012  1. Seizure disorder:  Resume keppra.   2. Acute respiratory failure: possibly from aspiration pneumonia.   3. hYpertensive urgency: resume home medications. BP is better controlled today.   4. Acute renal failure >> resolved.  5. Dysphagia: will need MBS when the patient is more stable.  Will get ng TUBE and start tube feeds.   DVT prophylaxis.   Antibiotics:  Rocephin.  HPI/Subjective: Denies chest pain or shortness of breath and headache.   Objective: Filed Vitals:   06/07/12 1715 06/07/12 1800 06/07/12 1815 06/07/12 1830  BP: 141/99 164/106 180/90 159/97  Pulse: 90 89 95 95  Temp:      TempSrc:      Resp: 20 22    Height:      Weight:      SpO2: 99% 99% 99% 100%   No intake or output data in the 24 hours ending 06/07/12 1901 Filed Weights   06/05/12 0500 06/06/12 0438 06/07/12 0617  Weight: 94.6 kg (208 lb 8.9 oz) 94.2 kg (207 lb 10.8 oz) 93.668 kg (206 lb 8 oz)     PHYSICAL EXAMINATION:  General: No distress  Neuro: Alert  HEENT: No sinus tenderness  Cardiovascular: s1s2 regular, no murmur  Lungs: Decreased breath sounds Lt base  Abdomen: Soft , non tender  Musculoskeletal: No edema  Skin: No rash      Data Reviewed: Basic Metabolic Panel:  Recent Labs Lab 05/18/2012 1152 06/03/12 0137 06/03/12 0420 06/04/12 0500 06/05/12 0522 06/06/12 0425 06/07/12 0602  NA 151* 148* 147* 145 145 149* 148*  K 4.4 3.5 3.9 3.6 3.5 3.5 3.5  CL 107 113* 113* 109 111 114* 113*  CO2 10* 21 19 24 24 22 21   GLUCOSE 282* 135* 141* 133* 136* 92 102*  BUN 21 27* 28* 35* 37* 22 17  CREATININE 1.72* 2.31* 2.34* 2.15* 1.75* 1.33 1.12  CALCIUM 9.6 8.1* 8.0* 7.4* 7.5* 7.9* 8.3*  MG  --  2.0 1.9  --   --   --   --   PHOS  --   --  5.1*  --   --   --   --    Liver Function Tests:  Recent Labs Lab 06/01/2012 1152 06/03/12 0137 06/03/12 1626 06/04/12 0500  AST 33  44*  --   --   ALT 16 16  --   --   ALKPHOS 58 43  --   --   BILITOT 0.4 0.5  --   --   PROT 8.1 6.3  --   --   ALBUMIN 3.8 2.9* 2.9* 2.7*    Recent Labs Lab 06/03/12 0420  LIPASE 13   No results found for this basename: AMMONIA,  in the last 168 hours CBC:  Recent Labs Lab 06/01/12 1247 06/11/2012 1152 06/03/12 0137 06/04/12 0500 06/05/12 0522 06/06/12 0425 06/07/12 0602  WBC 6.5 10.1 8.6 7.6 5.3 5.3 4.2  NEUTROABS 5.3 5.1  --   --   --   --   --   HGB 13.0 13.2 12.4* 10.4* 10.1* 10.3* 10.7*  HCT 39.2 43.1 37.3* 32.2* 31.3* 31.5* 32.7*  MCV 93.6 100.0 93.5 95.3 95.4 94.3 94.0  PLT 175 226 148* 120* 128* 135* 154   Cardiac Enzymes:  Recent Labs Lab 05/23/2012 1152 05/18/2012 1428  CKTOTAL  --  340*  CKMB  --  5.7*  TROPONINI <0.30  --    BNP (  last 3 results) No results found for this basename: PROBNP,  in the last 8760 hours CBG:  Recent Labs Lab 06/07/12 0010 06/07/12 0406 06/07/12 0837 06/07/12 1200 06/07/12 1632  GLUCAP 91 91 104* 109* 118*    Recent Results (from the past 240 hour(s))  URINE CULTURE     Status: None   Collection Time    06/15/2012 12:11 PM      Result Value Range Status   Specimen Description URINE, CATHETERIZED   Final   Special Requests NONE   Final   Culture  Setup Time 2012-06-15 20:14   Final   Colony Count NO GROWTH   Final   Culture NO GROWTH   Final   Report Status 06/03/2012 FINAL   Final  URINE CULTURE     Status: None   Collection Time    June 15, 2012  5:45 PM      Result Value Range Status   Specimen Description URINE, CATHETERIZED   Final   Special Requests NONE   Final   Culture  Setup Time 06/03/2012 02:01   Final   Colony Count NO GROWTH   Final   Culture NO GROWTH   Final   Report Status 06/04/2012 FINAL   Final  MRSA PCR SCREENING     Status: None   Collection Time    06-15-2012  6:13 PM      Result Value Range Status   MRSA by PCR NEGATIVE  NEGATIVE Final   Comment:            The GeneXpert MRSA Assay (FDA      approved for NASAL specimens     only), is one component of a     comprehensive MRSA colonization     surveillance program. It is not     intended to diagnose MRSA     infection nor to guide or     monitor treatment for     MRSA infections.  CULTURE, BLOOD (ROUTINE X 2)     Status: None   Collection Time    06/03/12  1:37 AM      Result Value Range Status   Specimen Description BLOOD RIGHT RADIAL ARTERIAL   Final   Special Requests BOTTLES DRAWN AEROBIC AND ANAEROBIC 5CC EA   Final   Culture  Setup Time 06/03/2012 08:25   Final   Culture     Final   Value:        BLOOD CULTURE RECEIVED NO GROWTH TO DATE CULTURE WILL BE HELD FOR 5 DAYS BEFORE ISSUING A FINAL NEGATIVE REPORT   Report Status PENDING   Incomplete  CULTURE, BLOOD (ROUTINE X 2)     Status: None   Collection Time    06/03/12  1:50 AM      Result Value Range Status   Specimen Description BLOOD LEFT ARM   Final   Special Requests BOTTLES DRAWN AEROBIC ONLY 1CC   Final   Culture  Setup Time 06/03/2012 08:25   Final   Culture     Final   Value:        BLOOD CULTURE RECEIVED NO GROWTH TO DATE CULTURE WILL BE HELD FOR 5 DAYS BEFORE ISSUING A FINAL NEGATIVE REPORT   Report Status PENDING   Incomplete  CULTURE, RESPIRATORY (NON-EXPECTORATED)     Status: None   Collection Time    06/03/12  3:51 AM      Result Value Range Status   Specimen Description TRACHEAL ASPIRATE   Final  Special Requests Normal   Final   Gram Stain     Final   Value: ABUNDANT WBC PRESENT, PREDOMINANTLY PMN     RARE SQUAMOUS EPITHELIAL CELLS PRESENT     MODERATE GRAM POSITIVE COCCI IN PAIRS     RARE GRAM POSITIVE RODS   Culture Non-Pathogenic Oropharyngeal-type Flora Isolated.   Final   Report Status 06/05/2012 FINAL   Final  STOOL CULTURE     Status: None   Collection Time    06/04/12  6:47 PM      Result Value Range Status   Specimen Description STOOL   Final   Special Requests NONE   Final   Culture NO SUSPICIOUS COLONIES, CONTINUING TO HOLD    Final   Report Status PENDING   Incomplete     Studies: Dg Chest Port 1 View  06/07/2012  *RADIOLOGY REPORT*  Clinical Data: History of hypertension and diabetes and stroke. Nonsmoker  PORTABLE CHEST - 1 VIEW  Comparison: 06/05/2012  Findings: The patient has been extubated and the right internal jugular CVP and nasogastric tube have been removed. 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  Slightly improved lung volumes are seen with a stable cardiomediastinal contour and unchanged aortic ectasia.  Unchanged blunting of the costophrenic angle on the left is seen and may represent chronic change or a small amount of left pleural fluid.  Subsegmental atelectasis/infiltrate at the left base persists.  No new focal infiltrates or signs to suggest congestive failure seen.  IMPRESSION: Slightly improved lung volumes with an otherwise stable appearance noted post extubation.   Original Report Authenticated By: Rhodia Albright, M.D.     Scheduled Meds: . antiseptic oral rinse  1 application Mouth Rinse QID  . cefTRIAXone (ROCEPHIN)  IV  1 g Intravenous Q24H  . chlorhexidine  15 mL Mouth/Throat BID  . cloNIDine  0.3 mg Oral TID  . heparin subcutaneous  5,000 Units Subcutaneous Q8H  . hydrALAZINE  25 mg Oral Q8H  . insulin aspart  0-20 Units Subcutaneous Q4H  . levETIRAcetam  1,500 mg Intravenous Q12H  . metoprolol tartrate  100 mg Oral BID  . pantoprazole (PROTONIX) IV  40 mg Intravenous Q24H   Continuous Infusions:   Active Problems:   Acute respiratory failure   Aspiration pneumonia        Fiana Gladu  [facility] Pager [phone] If 8PM-8AM, please contact night-coverage at [email or web address], password [facility] 06/07/2012, 7:01 PM  LOS: 5 days

## 2012-06-08 ENCOUNTER — Inpatient Hospital Stay (HOSPITAL_COMMUNITY): Payer: PRIVATE HEALTH INSURANCE

## 2012-06-08 LAB — BASIC METABOLIC PANEL
CO2: 22 mEq/L (ref 19–32)
Calcium: 8.2 mg/dL — ABNORMAL LOW (ref 8.4–10.5)
Chloride: 115 mEq/L — ABNORMAL HIGH (ref 96–112)
Creatinine, Ser: 1.5 mg/dL — ABNORMAL HIGH (ref 0.50–1.35)
GFR calc Af Amer: 54 mL/min — ABNORMAL LOW (ref >90)
Sodium: 147 mEq/L — ABNORMAL HIGH (ref 135–145)

## 2012-06-08 LAB — GLUCOSE, CAPILLARY
Glucose-Capillary: 132 mg/dL — ABNORMAL HIGH (ref 70–99)
Glucose-Capillary: 146 mg/dL — ABNORMAL HIGH (ref 70–99)
Glucose-Capillary: 116 mg/dL — ABNORMAL HIGH (ref 70–99)

## 2012-06-08 LAB — STOOL CULTURE

## 2012-06-08 MED ORDER — CHLORPROMAZINE HCL 10 MG PO TABS
10.0000 mg | ORAL_TABLET | Freq: Three times a day (TID) | ORAL | Status: DC | PRN
  Administered 2012-06-09: 10 mg via ORAL
  Filled 2012-06-08 (×3): qty 1

## 2012-06-08 MED ORDER — JEVITY 1.2 CAL PO LIQD
1000.0000 mL | ORAL | Status: DC
  Filled 2012-06-08 (×3): qty 1000

## 2012-06-08 NOTE — Progress Notes (Signed)
Attempted NGT insertion unsuccessful. Pt states he will like to attempt at another time.

## 2012-06-08 NOTE — Progress Notes (Signed)
[facility] PROGRESS NOTE  MIGUELANGEL KORN HYQ:[id number] DOB: [birth year] DOA: Jun 26, 2012  1. Seizure disorder:  Resume keppra.   2. Acute respiratory failure: possibly from aspiration pneumonia.   3. hYpertensive urgency: resume home medications. BP is better controlled today.   4. Acute renal failure >> resolved.  5. Dysphagia: will need MBS when the patient is more stable.  Will get ng TUBE and start tube feeds.   DVT prophylaxis.   Antibiotics:  Rocephin.  HPI/Subjective: Denies chest pain or shortness of breath and headache.   Objective: Filed Vitals:   06/08/12 0801 06/08/12 1149 06/08/12 1328 06/08/12 1550  BP: 152/79  167/90 190/96  Pulse: 94     Temp: 98.8 F (37.1 C) 99.4 F (37.4 C)    TempSrc: Axillary Oral    Resp: 22     Height:      Weight:      SpO2: 96%       Intake/Output Summary (Last 24 hours) at 06/08/12 1847 Last data filed at 06/08/12 0600  Gross per 24 hour  Intake  912.5 ml  Output   1000 ml  Net  -87.5 ml   Filed Weights   06/06/12 0438 06/07/12 0617 06/08/12 0500  Weight: 94.2 kg (207 lb 10.8 oz) 93.668 kg (206 lb 8 oz) 90 kg (198 lb 6.6 oz)     PHYSICAL EXAMINATION:  General: No distress  Neuro: Alert  HEENT: No sinus tenderness  Cardiovascular: s1s2 regular, no murmur  Lungs: Decreased breath sounds Lt base  Abdomen: Soft , non tender  Musculoskeletal: No edema  Skin: No rash      Data Reviewed: Basic Metabolic Panel:  Recent Labs Lab 2012/06/26 1152 06/03/12 0137 06/03/12 0420 06/04/12 0500 06/05/12 0522 06/06/12 0425 06/07/12 0602 06/08/12 1400  NA 151* 148* 147* 145 145 149* 148* 147*  K 4.4 3.5 3.9 3.6 3.5 3.5 3.5 3.1*  CL 107 113* 113* 109 111 114* 113* 115*  CO2 10* 21 19 24 24 22 21 22   GLUCOSE 282* 135* 141* 133* 136* 92 102* 162*  BUN 21 27* 28* 35* 37* 22 17 24*  CREATININE 1.72* 2.31* 2.34* 2.15* 1.75* 1.33 1.12 1.50*  CALCIUM 9.6 8.1* 8.0* 7.4* 7.5* 7.9* 8.3* 8.2*  MG  --  2.0 1.9   --   --   --   --   --   PHOS  --   --  5.1*  --   --   --   --   --    Liver Function Tests:  Recent Labs Lab 06-26-12 1152 06/03/12 0137 06/03/12 1626 06/04/12 0500  AST 33 44*  --   --   ALT 16 16  --   --   ALKPHOS 58 43  --   --   BILITOT 0.4 0.5  --   --   PROT 8.1 6.3  --   --   ALBUMIN 3.8 2.9* 2.9* 2.7*    Recent Labs Lab 06/03/12 0420  LIPASE 13   No results found for this basename: AMMONIA,  in the last 168 hours CBC:  Recent Labs Lab 2012/06/26 1152 06/03/12 0137 06/04/12 0500 06/05/12 0522 06/06/12 0425 06/07/12 0602  WBC 10.1 8.6 7.6 5.3 5.3 4.2  NEUTROABS 5.1  --   --   --   --   --   HGB 13.2 12.4* 10.4* 10.1* 10.3* 10.7*  HCT 43.1 37.3* 32.2* 31.3* 31.5* 32.7*  MCV 100.0 93.5 95.3 95.4 94.3 94.0  PLT 226 148* 120* 128* 135* 154   Cardiac Enzymes:  Recent Labs Lab 05/21/2012 1152 05/31/2012 1428  CKTOTAL  --  340*  CKMB  --  5.7*  TROPONINI <0.30  --    BNP (last 3 results) No results found for this basename: PROBNP,  in the last 8760 hours CBG:  Recent Labs Lab 06/07/12 2336 06/08/12 0418 06/08/12 0808 06/08/12 1148 06/08/12 1706  GLUCAP 123* 122* 124* 132* 146*    Recent Results (from the past 240 hour(s))  URINE CULTURE     Status: None   Collection Time    06/08/2012 12:11 PM      Result Value Range Status   Specimen Description URINE, CATHETERIZED   Final   Special Requests NONE   Final   Culture  Setup Time 05/21/2012 20:14   Final   Colony Count NO GROWTH   Final   Culture NO GROWTH   Final   Report Status 06/03/2012 FINAL   Final  URINE CULTURE     Status: None   Collection Time    05/29/2012  5:45 PM      Result Value Range Status   Specimen Description URINE, CATHETERIZED   Final   Special Requests NONE   Final   Culture  Setup Time 06/03/2012 02:01   Final   Colony Count NO GROWTH   Final   Culture NO GROWTH   Final   Report Status 06/04/2012 FINAL   Final  MRSA PCR SCREENING     Status: None   Collection Time     06/07/2012  6:13 PM      Result Value Range Status   MRSA by PCR NEGATIVE  NEGATIVE Final   Comment:            The GeneXpert MRSA Assay (FDA     approved for NASAL specimens     only), is one component of a     comprehensive MRSA colonization     surveillance program. It is not     intended to diagnose MRSA     infection nor to guide or     monitor treatment for     MRSA infections.  CULTURE, BLOOD (ROUTINE X 2)     Status: None   Collection Time    06/03/12  1:37 AM      Result Value Range Status   Specimen Description BLOOD RIGHT RADIAL ARTERIAL   Final   Special Requests BOTTLES DRAWN AEROBIC AND ANAEROBIC 5CC EA   Final   Culture  Setup Time 06/03/2012 08:25   Final   Culture     Final   Value:        BLOOD CULTURE RECEIVED NO GROWTH TO DATE CULTURE WILL BE HELD FOR 5 DAYS BEFORE ISSUING A FINAL NEGATIVE REPORT   Report Status PENDING   Incomplete  CULTURE, BLOOD (ROUTINE X 2)     Status: None   Collection Time    06/03/12  1:50 AM      Result Value Range Status   Specimen Description BLOOD LEFT ARM   Final   Special Requests BOTTLES DRAWN AEROBIC ONLY 1CC   Final   Culture  Setup Time 06/03/2012 08:25   Final   Culture     Final   Value:        BLOOD CULTURE RECEIVED NO GROWTH TO DATE CULTURE WILL BE HELD FOR 5 DAYS BEFORE ISSUING A FINAL NEGATIVE REPORT   Report Status PENDING  Incomplete  CULTURE, RESPIRATORY (NON-EXPECTORATED)     Status: None   Collection Time    06/03/12  3:51 AM      Result Value Range Status   Specimen Description TRACHEAL ASPIRATE   Final   Special Requests Normal   Final   Gram Stain     Final   Value: ABUNDANT WBC PRESENT, PREDOMINANTLY PMN     RARE SQUAMOUS EPITHELIAL CELLS PRESENT     MODERATE GRAM POSITIVE COCCI IN PAIRS     RARE GRAM POSITIVE RODS   Culture Non-Pathogenic Oropharyngeal-type Flora Isolated.   Final   Report Status 06/05/2012 FINAL   Final  STOOL CULTURE     Status: None   Collection Time    06/04/12  6:47 PM       Result Value Range Status   Specimen Description STOOL   Final   Special Requests NONE   Final   Culture     Final   Value: NO SALMONELLA, SHIGELLA, CAMPYLOBACTER, YERSINIA, OR E.COLI 0157:H7 ISOLATED   Report Status 06/08/2012 FINAL   Final     Studies: Dg Chest Port 1 View  06/07/2012  *RADIOLOGY REPORT*  Clinical Data: History of hypertension and diabetes and stroke. Nonsmoker  PORTABLE CHEST - 1 VIEW  Comparison: 06/05/2012  Findings: The patient has been extubated and the right internal jugular CVP and nasogastric tube have been removed. 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  Slightly improved lung volumes are seen with a stable cardiomediastinal contour and unchanged aortic ectasia.  Unchanged blunting of the costophrenic angle on the left is seen and may represent chronic change or a small amount of left pleural fluid.  Subsegmental atelectasis/infiltrate at the left base persists.  No new focal infiltrates or signs to suggest congestive failure seen.  IMPRESSION: Slightly improved lung volumes with an otherwise stable appearance noted post extubation.   Original Report Authenticated By: Rhodia Albright, M.D.    Dg Swallowing Func-speech Pathology  06/08/2012  Lacinda Axon, CCC-SLP     06/08/2012  5:05 PM Objective Swallowing Evaluation: Modified Barium Swallowing Study   Patient Details  Name: Robert Mcclure MRN: [id number] Date of Birth: 02-Dec-1945  Today's Date: 06/08/2012 Time: 4098-1191 SLP Time Calculation (min): 38 min  Past Medical History:  Past Medical History  Diagnosis Date  . Hypertension   . Diabetes mellitus   . CVA (cerebral vascular accident)   . Stroke    Past Surgical History: History reviewed. No pertinent past  surgical history. HPI:  67 yo male admitted with status epilepticus. Intubated 2/16-2/19.  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  Remote hx  of dysphagia s/p CVA with  resolution.  Objective assessment of  MBS indicated to assess risk for aspiration and recommend safest  diet consistency.    Assessment / Plan / Recommendation Clinical Impression  Dysphagia Diagnosis: Severe oral phase dysphagia;Mild pharyngeal  phase dysphagia Severe sensory motor oral dysphagia marked by poor oral  awareness, lingual pumping,oral holding and piecemeal swallows.  Severe delay noted in anterior to posterior propulsion.    Mild  to moderate sensory motor pharyngeal dysphagia. Patient with  hiccoughing prior to PO trials.    Initial swallow of thin barium  administered by spoon WFL with no penetration or aspiration  noted.  Trial of thin by cup resulted in premature spillage to  vallecular space due to reduced TBR.  Thin liquid bolus  eventually spilled to pyriforms prior to initiating swallow.   Silent aspiration occurred during the swallow at moment patient  hiccoughed.  Initial swallow of puree WFL but patient with  piecemeal swallow with subsequent spillage to vallecular space.   Patient unable to complete necessary second swallow to clear  residue even with max verbal and tactile cues due to intermittent  ability to follow directions increasing risk for aspiration after  the swallow.  Recommend to continue NPO status due to patient  with decreased ability to protect his airway mainly due to  combination of decreased cognitive status and severity of the  oral phase.  Patient may benefit from temporary means of  nutrition and hydration.  Recommend repeat MBS in 2 to 3 days  with clinical improvement.  ST to follow in acute care setting to  address dysphagia to resume PO's.    Treatment Recommendation  Therapy as outlined in treatment plan below    Diet Recommendation NPO;Alternative means - temporary   Medication Administration: Crushed with puree    Other  Recommendations Oral Care Recommendations: Oral care QID   Follow Up Recommendations     SNF    Frequency and Duration min 2x/week  2 weeks        SLP Swallow Goals Swallow Study Goal #3 - Progress: Progressing toward goal   General Date of Onset: 06-20-12 HPI: 67 yo male admitted with status epilepticus. Intubated  2/16-2/19. Significant PMHx of vascular dementia, seizures, HTN,  DM, CVA (Remote Lt MCA and Rt basal ganglia infarcts) , Gout.   Remote hx of dysphagia s/p CVA with resolution. Type of Study: Modified Barium Swallowing Study Reason for Referral: Objectively evaluate swallowing function Previous Swallow Assessment: 05/21/10 dysphagia 3/thin , BSE  06/06/12 NPO  Diet Prior to this Study: NPO Temperature Spikes Noted: No Respiratory Status: Room air History of Recent Intubation: Yes Length of Intubations (days): 3 days Date extubated: 06/05/12 Behavior/Cognition: Alert;Cooperative;Pleasant  mood;Confused;Requires cueing Oral Cavity - Dentition: Edentulous Oral Motor / Sensory Function: Impaired - see Bedside swallow  eval Self-Feeding Abilities: Total assist Patient Positioning: Upright in chair Baseline Vocal Quality: Clear Volitional Cough: Cognitively unable to elicit Volitional Swallow: Unable to elicit Anatomy: Within functional limits Pharyngeal Secretions: Not observed secondary MBS    Reason for Referral Objectively evaluate swallowing function   Oral Phase Oral Preparation/Oral Phase Oral Phase: Impaired Oral - Thin Oral - Thin Teaspoon: Lingual pumping;Incomplete tongue to palate  contact;Reduced posterior propulsion;Holding of bolus;Piecemeal  swallowing;Lingual/palatal residue;Delayed oral transit Oral - Thin Cup: Lingual pumping;Incomplete tongue to palate  contact;Delayed oral transit;Piecemeal swallowing;Lingual/palatal  residue Oral - Solids Oral - Puree: Lingual pumping;Incomplete tongue to palate  contact;Reduced posterior propulsion;Weak lingual  manipulation;Holding of bolus;Lingual/palatal residue;Piecemeal  swallowing;Delayed oral transit   Pharyngeal Phase Pharyngeal Phase Pharyngeal Phase: Impaired Pharyngeal - Thin Pharyngeal  - Thin Teaspoon: Delayed swallow initiation;Premature  spillage to valleculae;Reduced tongue base retraction Pharyngeal - Thin Cup: Delayed swallow initiation;Premature  spillage to valleculae;Premature spillage to pyriform  sinuses;Reduced anterior laryngeal mobility;Trace  aspiration;Penetration/Aspiration during swallow;Reduced  airway/laryngeal closure;Reduced tongue base retraction Penetration/Aspiration details (thin cup): Material enters  airway, CONTACTS cords and not ejected out Pharyngeal - Solids Pharyngeal - Puree: Reduced laryngeal elevation;Delayed swallow  initiation;Premature spillage to valleculae;Reduced tongue base  retraction;Pharyngeal residue - valleculae  Cervical Esophageal Phase    GO             Moreen Fowler MS, CCC-SLP [phone] [facility] 06/08/2012, 4:44 PM      Scheduled Meds: .  antiseptic oral rinse  1 application Mouth Rinse QID  . cefTRIAXone (ROCEPHIN)  IV  1 g Intravenous Q24H  . chlorhexidine  15 mL Mouth/Throat BID  . cloNIDine  0.3 mg Oral TID  . heparin subcutaneous  5,000 Units Subcutaneous Q8H  . hydrALAZINE  25 mg Oral Q8H  . insulin aspart  0-20 Units Subcutaneous Q4H  . levETIRAcetam  1,500 mg Intravenous Q12H  . metoprolol tartrate  100 mg Oral BID  . pantoprazole (PROTONIX) IV  40 mg Intravenous Q24H   Continuous Infusions: . dextrose 5 % and 0.9% NaCl 50 mL/hr at 06/08/12 1551  . feeding supplement (JEVITY 1.2 CAL)      Active Problems:   Acute respiratory failure   Aspiration pneumonia        Lalia Loudon  [facility] Pager [phone] If 8PM-8AM, please contact night-coverage at [email or web address], password [facility] 06/08/2012, 6:47 PM  LOS: 6 days

## 2012-06-08 NOTE — Progress Notes (Signed)
Patient has pulled out his NG tube and RN staff is unable to placed a new one. Provider on call was notified. May needs IR to placed NG tube. Previous tube was placed with some difficulty. Will continue to monitor.   Robert Mcclure J. Lendell Caprice RN BSN

## 2012-06-08 NOTE — Progress Notes (Signed)
NG-tube placed x 1 attempt with some difficulty. Placement checked and verified with J.Kome RN . Jevity ordered from Pharmacy. Thanks. Ancil Linsey RN

## 2012-06-08 NOTE — Procedures (Signed)
Objective Swallowing Evaluation: Modified Barium Swallowing Study  Patient Details  Name: Robert Mcclure MRN: [id number] Date of Birth: 29-Jan-1946  Today's Date: 06/08/2012 Time: 4098-1191 SLP Time Calculation (min): 38 min  Past Medical History:  Past Medical History  Diagnosis Date  . Hypertension   . Diabetes mellitus   . CVA (cerebral vascular accident)   . Stroke    Past Surgical History: History reviewed. No pertinent past surgical history. HPI:  67 yo male admitted with status epilepticus. Intubated 2/16-2/19. Significant PMHx of vascular dementia, seizures, HTN, DM, CVA (Remote Lt MCA and Rt basal ganglia infarcts) , Gout.  Remote hx of dysphagia s/p CVA with resolution.  Objective assessment of MBS indicated to assess risk for aspiration and recommend safest diet consistency.    Assessment / Plan / Recommendation Clinical Impression  Dysphagia Diagnosis: Severe oral phase dysphagia;Mild pharyngeal phase dysphagia Severe sensory motor oral dysphagia marked by poor oral awareness, lingual pumping,oral holding and piecemeal swallows. Severe delay noted in anterior to posterior propulsion.    Mild to moderate sensory motor pharyngeal dysphagia. Patient with hiccoughing prior to PO trials.    Initial swallow of thin barium administered by spoon WFL with no penetration or aspiration noted.  Trial of thin by cup resulted in premature spillage to vallecular space due to reduced TBR.  Thin liquid bolus eventually spilled to pyriforms prior to initiating swallow.  Silent aspiration occurred during the swallow at moment patient hiccoughed.  Initial swallow of puree WFL but patient with piecemeal swallow with subsequent spillage to vallecular space.  Patient unable to complete necessary second swallow to clear residue even with max verbal and tactile cues due to intermittent ability to follow directions increasing risk for aspiration after the swallow.  Recommend to continue NPO status due  to patient with decreased ability to protect his airway mainly due to combination of decreased cognitive status and severity of the oral phase.  Patient may benefit from temporary means of nutrition and hydration.  Recommend repeat MBS in 2 to 3 days with clinical improvement.  ST to follow in acute care setting to address dysphagia to resume PO's.    Treatment Recommendation  Therapy as outlined in treatment plan below    Diet Recommendation NPO;Alternative means - temporary   Medication Administration: Crushed with puree    Other  Recommendations Oral Care Recommendations: Oral care QID   Follow Up Recommendations     SNF    Frequency and Duration min 2x/week  2 weeks       SLP Swallow Goals Swallow Study Goal #3 - Progress: Progressing toward goal   General Date of Onset: 05/28/2012 HPI: 67 yo male admitted with status epilepticus. Intubated 2/16-2/19. Significant PMHx of vascular dementia, seizures, HTN, DM, CVA (Remote Lt MCA and Rt basal ganglia infarcts) , Gout.  Remote hx of dysphagia s/p CVA with resolution. Type of Study: Modified Barium Swallowing Study Reason for Referral: Objectively evaluate swallowing function Previous Swallow Assessment: 05/21/10 dysphagia 3/thin , BSE 06/06/12 NPO  Diet Prior to this Study: NPO Temperature Spikes Noted: No Respiratory Status: Room air History of Recent Intubation: Yes Length of Intubations (days): 3 days Date extubated: 06/05/12 Behavior/Cognition: Alert;Cooperative;Pleasant mood;Confused;Requires cueing Oral Cavity - Dentition: Edentulous Oral Motor / Sensory Function: Impaired - see Bedside swallow eval Self-Feeding Abilities: Total assist Patient Positioning: Upright in chair Baseline Vocal Quality: Clear Volitional Cough: Cognitively unable to elicit Volitional Swallow: Unable to elicit Anatomy: Within functional limits Pharyngeal Secretions: Not observed secondary MBS  Reason for Referral Objectively evaluate swallowing  function   Oral Phase Oral Preparation/Oral Phase Oral Phase: Impaired Oral - Thin Oral - Thin Teaspoon: Lingual pumping;Incomplete tongue to palate contact;Reduced posterior propulsion;Holding of bolus;Piecemeal swallowing;Lingual/palatal residue;Delayed oral transit Oral - Thin Cup: Lingual pumping;Incomplete tongue to palate contact;Delayed oral transit;Piecemeal swallowing;Lingual/palatal residue Oral - Solids Oral - Puree: Lingual pumping;Incomplete tongue to palate contact;Reduced posterior propulsion;Weak lingual manipulation;Holding of bolus;Lingual/palatal residue;Piecemeal swallowing;Delayed oral transit   Pharyngeal Phase Pharyngeal Phase Pharyngeal Phase: Impaired Pharyngeal - Thin Pharyngeal - Thin Teaspoon: Delayed swallow initiation;Premature spillage to valleculae;Reduced tongue base retraction Pharyngeal - Thin Cup: Delayed swallow initiation;Premature spillage to valleculae;Premature spillage to pyriform sinuses;Reduced anterior laryngeal mobility;Trace aspiration;Penetration/Aspiration during swallow;Reduced airway/laryngeal closure;Reduced tongue base retraction Penetration/Aspiration details (thin cup): Material enters airway, CONTACTS cords and not ejected out Pharyngeal - Solids Pharyngeal - Puree: Reduced laryngeal elevation;Delayed swallow initiation;Premature spillage to valleculae;Reduced tongue base retraction;Pharyngeal residue - valleculae  Cervical Esophageal Phase    GO             Moreen Fowler MS, CCC-SLP [phone] [facility] 06/08/2012, 4:44 PM

## 2012-06-08 NOTE — Progress Notes (Addendum)
Speech Language Pathology Dysphagia Treatment Patient Details Name: Robert Mcclure MRN: [id number] DOB: [birth year] Today's Date: 06/08/2012 Time: 1308-6578 SLP Time Calculation (min): 29 min  Assessment / Plan / Recommendation Clinical Impression  Patient scheduled for objective evaluation of MBS on 06/07/12 but deferred due to patient presenting with medical issues prohibiting completion.  Focus of treatment to reassess swallow for possible PO readiness vs. MBS readiness.  Patient with increased LOA in comparision to initial BSE.  Patient adminstered PO trials of puree and thin water by spoon and cup.  Multiple swallows required for each bite and sip indicating continued weakness.  Swallow audible.  Moderate amount of coughing s/p PO trials. Due to s/s present  recommend to proceed with objective evaluation of MBS to assess risk of aspiration and recommend safest diet consistency if medically appropriate.  MD confirmed to proceed with evaluation.     Diet Recommendation  Continue with Current Diet: NPO    SLP Plan MBS;New goals to be determined pending objective testing      Swallowing Goals  SLP Swallowing Goals Swallow Study Goal #3 - Progress: Progressing toward goal  General Temperature Spikes Noted: No Respiratory Status: Room air Behavior/Cognition: Alert;Cooperative;Pleasant mood;Confused Oral Cavity - Dentition: Edentulous Patient Positioning: Upright in bed  Oral Cavity - Oral Hygiene Does patient have any of the following "at risk" factors?: Nutritional status - inadequate Patient is HIGH RISK - Oral Care Protocol followed (see row info): Yes Patient is AT RISK - Oral Care Protocol followed (see row info): Yes   Dysphagia Treatment Treatment focused on: Upgraded PO texture trials;Facilitation of pharyngeal phase Treatment Methods/Modalities: Skilled observation;Differential diagnosis Patient observed directly with PO's: Yes Type of PO's observed: Dysphagia 1  (puree);Thin liquids Feeding: Needs assist Liquids provided via: Cup;Teaspoon Pharyngeal Phase Signs & Symptoms: Suspected delayed swallow initiation;Multiple swallows;Audible swallow Type of cueing: Verbal Amount of cueing: Moderate   GO    Moreen Fowler MS, CCC-SLP [phone] 06/08/2012, 3:31 PM

## 2012-06-08 NOTE — Evaluation (Signed)
Physical Therapy Evaluation Patient Details Name: Robert Mcclure MRN: [id number] DOB: May 19, 1945 Today's Date: 06/08/2012 Time: 4098-1191 PT Time Calculation (min): 17 min  PT Assessment / Plan / Recommendation Clinical Impression  Patient is a 67 yo male admitted with seizures and acute respiratory failure - intubated.  Patient extubated 06/05/12.  Will benefit from acute PT to maximize independence prior to discharge.  Recommend HHPT at discharge for continued therapy, and continuation of aide for ADL's.    PT Assessment  Patient needs continued PT services    Follow Up Recommendations  Home health PT;Supervision - Intermittent    Does the patient have the potential to tolerate intense rehabilitation      Barriers to Discharge Decreased caregiver support      Equipment Recommendations  None recommended by PT    Recommendations for Other Services     Frequency Min 4X/week    Precautions / Restrictions Precautions Precautions: Fall Restrictions Weight Bearing Restrictions: No   Pertinent Vitals/Pain       Mobility  Bed Mobility Bed Mobility: Rolling Right;Right Sidelying to Sit;Sitting - Scoot to Edge of Bed;Sit to Sidelying Right Rolling Right: 4: Min guard;With rail Right Sidelying to Sit: 3: Mod assist;With rails;HOB flat Sitting - Scoot to Edge of Bed: 4: Min assist Sit to Sidelying Right: 4: Min assist;With rail;HOB flat Details for Bed Mobility Assistance: Verbal and tactile cues for technique.  Increased time to complete task.  Assist to raise trunk off of bed.  Patient sat at EOB x 10 minutes, working on balance - static and dynamic.  Patient required assist to lift bil. LE's onto bed. Transfers Transfers: Not assessed    Exercises     PT Diagnosis: Difficulty walking;Generalized weakness;Altered mental status  PT Problem List: Decreased strength;Decreased activity tolerance;Decreased balance;Decreased mobility;Decreased cognition;Cardiopulmonary status  limiting activity PT Treatment Interventions: DME instruction;Gait training;Functional mobility training;Balance training;Cognitive remediation;Patient/family education   PT Goals Acute Rehab PT Goals PT Goal Formulation: With patient Time For Goal Achievement: 06/15/12 Potential to Achieve Goals: Good Pt will go Supine/Side to Sit: Independently;with HOB 0 degrees PT Goal: Supine/Side to Sit - Progress: Goal set today Pt will go Sit to Supine/Side: Independently;with HOB 0 degrees PT Goal: Sit to Supine/Side - Progress: Goal set today Pt will go Sit to Stand: with modified independence;with upper extremity assist PT Goal: Sit to Stand - Progress: Goal set today Pt will go Stand to Sit: with modified independence;with upper extremity assist PT Goal: Stand to Sit - Progress: Goal set today Pt will Ambulate: 51 - 150 feet;with modified independence;with rolling walker PT Goal: Ambulate - Progress: Goal set today  Visit Information  Last PT Received On: 06/08/12 Assistance Needed: +2    Subjective Data  Subjective: Short answers to questions. Patient Stated Goal: None stated   Prior Functioning  Home Living Lives With: Alone Available Help at Discharge: Personal care attendant;Family;Available PRN/intermittently (Aide 5 days/week 2-3 hours/day) Type of Home: Apartment Home Access: Level entry;Elevator Home Layout: One level Bathroom Shower/Tub: Engineer, manufacturing systems: Standard Bathroom Accessibility: Yes How Accessible: Accessible via walker Home Adaptive Equipment: Shower chair with back Prior Function Level of Independence: Independent with assistive device(s);Needs assistance Needs Assistance: Bathing;Meal Prep;Light Housekeeping Bath: Moderate Meal Prep: Maximal Light Housekeeping: Total Driving: No Vocation: Retired Musician: Expressive difficulties (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)    Cognition  Cognition Overall Cognitive Status:  Impaired Area of Impairment: Memory;Following commands;Problem solving Arousal/Alertness: Awake/alert Orientation Level: Disoriented to;Time;Situation Behavior During Session: [facility] for  tasks performed Memory Deficits: Difficulty providing prior functional level information Following Commands: Follows one step commands with increased time    Extremity/Trunk Assessment Right Upper Extremity Assessment RUE ROM/Strength/Tone: Deficits RUE ROM/Strength/Tone Deficits: Strength 3-/5 RUE Sensation: WFL - Light Touch Left Upper Extremity Assessment LUE ROM/Strength/Tone: WFL for tasks assessed LUE Sensation: WFL - Light Touch Right Lower Extremity Assessment RLE ROM/Strength/Tone: Deficits RLE ROM/Strength/Tone Deficits: Strength grossly 4-/5 RLE Sensation: WFL - Light Touch Left Lower Extremity Assessment LLE ROM/Strength/Tone: WFL for tasks assessed LLE Sensation: WFL - Light Touch   Balance Balance Balance Assessed: Yes Static Sitting Balance Static Sitting - Balance Support: Left upper extremity supported;Feet supported Static Sitting - Level of Assistance: 4: Min assist Static Sitting - Comment/# of Minutes: 10 minutes.  Patient leaning posteriorly and to right.  Verbal and visual cues to correct posture/midline position.  End of Session PT - End of Session Activity Tolerance: Patient limited by fatigue Patient left: in bed;with call bell/phone within reach;with nursing in room Nurse Communication: Mobility status  GP     Vena Austria 06/08/2012, 2:37 PM  Durenda Hurt. Renaldo Fiddler, [facility] Acute Rehab Services Pager [phone]

## 2012-06-08 NOTE — Progress Notes (Signed)
Explained NGT insertion procedure with patient.  Patient denied any questions or concern.  Attending physician came to assess pt and wanted speech therapy to do modified barium swallow first and if patient is successful can start on dysphagia diet if not successful then place NGT in.  Paged speech therapist and she stated that she will be right up.  Will continue to monitor.

## 2012-06-08 NOTE — Progress Notes (Signed)
Informed Blake Divine, MD regarding unsuccessful barium swallow.  Informed to continue with ngt and jevity.

## 2012-06-08 NOTE — Progress Notes (Signed)
Report given to Ray, RN. Pt transferred to 3W20. Pt alert and oriented x4. No acute distress. Family informed of transfer.

## 2012-06-09 LAB — GLUCOSE, CAPILLARY
Glucose-Capillary: 175 mg/dL — ABNORMAL HIGH (ref 70–99)
Glucose-Capillary: 168 mg/dL — ABNORMAL HIGH (ref 70–99)

## 2012-06-09 LAB — CULTURE, BLOOD (ROUTINE X 2)

## 2012-06-09 LAB — BASIC METABOLIC PANEL
CO2: 21 mEq/L (ref 19–32)
Calcium: 8.9 mg/dL (ref 8.4–10.5)
GFR calc non Af Amer: 50 mL/min — ABNORMAL LOW (ref >90)
Glucose, Bld: 160 mg/dL — ABNORMAL HIGH (ref 70–99)
Potassium: 3.1 mEq/L — ABNORMAL LOW (ref 3.5–5.1)
Sodium: 152 mEq/L — ABNORMAL HIGH (ref 135–145)

## 2012-06-09 MED ORDER — INSULIN ASPART 100 UNIT/ML ~~LOC~~ SOLN
0.0000 [IU] | Freq: Three times a day (TID) | SUBCUTANEOUS | Status: DC
  Administered 2012-06-09 – 2012-06-11 (×5): 2 [IU] via SUBCUTANEOUS

## 2012-06-09 MED ORDER — AMLODIPINE BESYLATE 5 MG PO TABS: 5.0000 mg | ORAL_TABLET | Freq: Every day | ORAL | Status: DC

## 2012-06-09 MED ORDER — AMLODIPINE BESYLATE 10 MG PO TABS
10.0000 mg | ORAL_TABLET | Freq: Every day | ORAL | Status: DC
  Administered 2012-06-09: 10 mg via ORAL
  Filled 2012-06-09 (×3): qty 1

## 2012-06-09 MED ORDER — POTASSIUM CHLORIDE CRYS ER 20 MEQ PO TBCR
40.0000 meq | EXTENDED_RELEASE_TABLET | Freq: Two times a day (BID) | ORAL | Status: AC
  Administered 2012-06-09 (×2): 40 meq via ORAL
  Filled 2012-06-09 (×2): qty 2

## 2012-06-09 MED ORDER — HYDRALAZINE HCL 50 MG PO TABS
50.0000 mg | ORAL_TABLET | Freq: Three times a day (TID) | ORAL | Status: DC
  Administered 2012-06-09 – 2012-06-10 (×3): 50 mg via ORAL
  Filled 2012-06-09 (×15): qty 1

## 2012-06-09 MED ORDER — INSULIN ASPART 100 UNIT/ML ~~LOC~~ SOLN: 0.0000 [IU] | Freq: Every day | SUBCUTANEOUS | Status: DC

## 2012-06-09 MED ORDER — DEXTROSE-NACL 5-0.9 % IV SOLN: INTRAVENOUS | Status: DC

## 2012-06-09 MED ORDER — DEXTROSE 5 % IV SOLN
INTRAVENOUS | Status: DC
  Administered 2012-06-09 – 2012-06-11 (×3): via INTRAVENOUS

## 2012-06-09 MED ORDER — PANTOPRAZOLE SODIUM 40 MG PO TBEC
40.0000 mg | DELAYED_RELEASE_TABLET | Freq: Every day | ORAL | Status: DC
  Filled 2012-06-09: qty 1

## 2012-06-09 NOTE — Progress Notes (Signed)
Patient B/P reassessed & with reading 183/98. No c/o Chest Pain or Discomfort. Patient was given scheduled rx for B/P. Incoming Nurse made aware. Will continue to monitor the patient.

## 2012-06-09 NOTE — Progress Notes (Addendum)
[facility] PROGRESS NOTE  Robert Mcclure GNF:[id number] DOB: Mar 24, 1946 DOA: 05/22/2012  1. Seizure disorder:  Resume keppra.   2. Acute respiratory failure: possibly from aspiration pneumonia. A repeat CXR does not show any new infiltrates and rocephin is discontinued on 2/23.   3. hYpertensive urgency: resume home medications. BP is better controlled today.   4. Acute renal failure >> resolved. And his creatinine is slowly getting worse probably from decreased po intake/ pre renal causes.   5. Dysphagia: will need repeat MBS when the patient is more stable.  Will get ng TUBE and start tube feeds.   6. Hypernatremia; his free water deficit is 4.5 liters . We have started him on free water fluid boluses 4 tmes a day. And added dextrose fluids today. Repeat bmp in am.   7. Hypokalemia: replete as needed.   DVT prophylaxis.   Antibiotics:  Rocephin.  HPI/Subjective: Denies chest pain or shortness of breath and headache.   Objective: Filed Vitals:   06/09/12 0500 06/09/12 0520 06/09/12 0638 06/09/12 0954  BP:  186/110 183/98 171/88  Pulse: 90 96 92 113  Temp: 98.3 F (36.8 C)     TempSrc: Oral     Resp: 18     Height:      Weight: 87.8 kg (193 lb 9 oz)     SpO2: 97%       Intake/Output Summary (Last 24 hours) at 06/09/12 1253 Last data filed at 06/09/12 1114  Gross per 24 hour  Intake      0 ml  Output    325 ml  Net   -325 ml   Filed Weights   06/07/12 0617 06/08/12 0500 06/09/12 0500  Weight: 93.668 kg (206 lb 8 oz) 90 kg (198 lb 6.6 oz) 87.8 kg (193 lb 9 oz)     PHYSICAL EXAMINATION:  General: No distress  Neuro: Alert  HEENT: No sinus tenderness  Cardiovascular: s1s2 regular, no murmur  Lungs: Decreased breath sounds Lt base  Abdomen: Soft , non tender  Musculoskeletal: No edema  Skin: No rash      Data Reviewed: Basic Metabolic Panel:  Recent Labs Lab 06/03/12 0137 06/03/12 0420  06/05/12 0522 06/06/12 0425  06/07/12 0602 06/08/12 1400 06/09/12 0550  NA 148* 147*  < > 145 149* 148* 147* 152*  K 3.5 3.9  < > 3.5 3.5 3.5 3.1* 3.1*  CL 113* 113*  < > 111 114* 113* 115* 117*  CO2 21 19  < > 24 22 21 22 21   GLUCOSE 135* 141*  < > 136* 92 102* 162* 160*  BUN 27* 28*  < > 37* 22 17 24* 24*  CREATININE 2.31* 2.34*  < > 1.75* 1.33 1.12 1.50* 1.41*  CALCIUM 8.1* 8.0*  < > 7.5* 7.9* 8.3* 8.2* 8.9  MG 2.0 1.9  --   --   --   --   --   --   PHOS  --  5.1*  --   --   --   --   --   --   < > = values in this interval not displayed. Liver Function Tests:  Recent Labs Lab 06/03/12 0137 06/03/12 1626 06/04/12 0500  AST 44*  --   --   ALT 16  --   --   ALKPHOS 43  --   --   BILITOT 0.5  --   --   PROT 6.3  --   --   ALBUMIN  2.9* 2.9* 2.7*    Recent Labs Lab 06/03/12 0420  LIPASE 13   No results found for this basename: AMMONIA,  in the last 168 hours CBC:  Recent Labs Lab 06/03/12 0137 06/04/12 0500 06/05/12 0522 06/06/12 0425 06/07/12 0602  WBC 8.6 7.6 5.3 5.3 4.2  HGB 12.4* 10.4* 10.1* 10.3* 10.7*  HCT 37.3* 32.2* 31.3* 31.5* 32.7*  MCV 93.5 95.3 95.4 94.3 94.0  PLT 148* 120* 128* 135* 154   Cardiac Enzymes:  Recent Labs Lab 05/28/2012 1428  CKTOTAL 340*  CKMB 5.7*   BNP (last 3 results) No results found for this basename: PROBNP,  in the last 8760 hours CBG:  Recent Labs Lab 06/08/12 2032 06/08/12 2347 06/09/12 0331 06/09/12 0720 06/09/12 1233  GLUCAP 116* 136* 111* 175* 126*    Recent Results (from the past 240 hour(s))  URINE CULTURE     Status: None   Collection Time    05/26/2012 12:11 PM      Result Value Range Status   Specimen Description URINE, CATHETERIZED   Final   Special Requests NONE   Final   Culture  Setup Time 05/31/2012 20:14   Final   Colony Count NO GROWTH   Final   Culture NO GROWTH   Final   Report Status 06/03/2012 FINAL   Final  URINE CULTURE     Status: None   Collection Time    06/01/2012  5:45 PM      Result Value Range Status    Specimen Description URINE, CATHETERIZED   Final   Special Requests NONE   Final   Culture  Setup Time 06/03/2012 02:01   Final   Colony Count NO GROWTH   Final   Culture NO GROWTH   Final   Report Status 06/04/2012 FINAL   Final  MRSA PCR SCREENING     Status: None   Collection Time    06/14/2012  6:13 PM      Result Value Range Status   MRSA by PCR NEGATIVE  NEGATIVE Final   Comment:            The GeneXpert MRSA Assay (FDA     approved for NASAL specimens     only), is one component of a     comprehensive MRSA colonization     surveillance program. It is not     intended to diagnose MRSA     infection nor to guide or     monitor treatment for     MRSA infections.  CULTURE, BLOOD (ROUTINE X 2)     Status: None   Collection Time    06/03/12  1:37 AM      Result Value Range Status   Specimen Description BLOOD RIGHT RADIAL ARTERIAL   Final   Special Requests BOTTLES DRAWN AEROBIC AND ANAEROBIC 5CC EA   Final   Culture  Setup Time 06/03/2012 08:25   Final   Culture NO GROWTH 5 DAYS   Final   Report Status 06/09/2012 FINAL   Final  CULTURE, BLOOD (ROUTINE X 2)     Status: None   Collection Time    06/03/12  1:50 AM      Result Value Range Status   Specimen Description BLOOD LEFT ARM   Final   Special Requests BOTTLES DRAWN AEROBIC ONLY 1CC   Final   Culture  Setup Time 06/03/2012 08:25   Final   Culture NO GROWTH 5 DAYS   Final   Report Status  06/09/2012 FINAL   Final  CULTURE, RESPIRATORY (NON-EXPECTORATED)     Status: None   Collection Time    06/03/12  3:51 AM      Result Value Range Status   Specimen Description TRACHEAL ASPIRATE   Final   Special Requests Normal   Final   Gram Stain     Final   Value: ABUNDANT WBC PRESENT, PREDOMINANTLY PMN     RARE SQUAMOUS EPITHELIAL CELLS PRESENT     MODERATE GRAM POSITIVE COCCI IN PAIRS     RARE GRAM POSITIVE RODS   Culture Non-Pathogenic Oropharyngeal-type Flora Isolated.   Final   Report Status 06/05/2012 FINAL   Final  STOOL  CULTURE     Status: None   Collection Time    06/04/12  6:47 PM      Result Value Range Status   Specimen Description STOOL   Final   Special Requests NONE   Final   Culture     Final   Value: NO SALMONELLA, SHIGELLA, CAMPYLOBACTER, YERSINIA, OR E.COLI 0157:H7 ISOLATED   Report Status 06/08/2012 FINAL   Final     Studies: Dg Swallowing Func-speech Pathology  06/08/2012  Lacinda Axon, CCC-SLP     06/08/2012  5:05 PM Objective Swallowing Evaluation: Modified Barium Swallowing Study   Patient Details  Name: Robert Mcclure MRN: [id number] Date of Birth: Aug 19, 1945  Today's Date: 06/08/2012 Time: 4098-1191 SLP Time Calculation (min): 38 min  Past Medical History:  Past Medical History  Diagnosis Date  . Hypertension   . Diabetes mellitus   . CVA (cerebral vascular accident)   . Stroke    Past Surgical History: History reviewed. No pertinent past  surgical history. HPI:  67 yo male admitted with status epilepticus. Intubated 2/16-2/19.  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  Remote hx  of dysphagia s/p CVA with resolution.  Objective assessment of  MBS indicated to assess risk for aspiration and recommend safest  diet consistency.    Assessment / Plan / Recommendation Clinical Impression  Dysphagia Diagnosis: Severe oral phase dysphagia;Mild pharyngeal  phase dysphagia Severe sensory motor oral dysphagia marked by poor oral  awareness, lingual pumping,oral holding and piecemeal swallows.  Severe delay noted in anterior to posterior propulsion.    Mild  to moderate sensory motor pharyngeal dysphagia. Patient with  hiccoughing prior to PO trials.    Initial swallow of thin barium  administered by spoon WFL with no penetration or aspiration  noted.  Trial of thin by cup resulted in premature spillage to  vallecular space due to reduced TBR.  Thin liquid bolus  eventually spilled to pyriforms prior to initiating swallow.   Silent  aspiration occurred during the swallow at moment patient  hiccoughed.  Initial swallow of puree WFL but patient with  piecemeal swallow with subsequent spillage to vallecular space.   Patient unable to complete necessary second swallow to clear  residue even with max verbal and tactile cues due to intermittent  ability to follow directions increasing risk for aspiration after  the swallow.  Recommend to continue NPO status due to patient  with decreased ability to protect his airway mainly due to  combination of decreased cognitive status and severity of the  oral phase.  Patient may benefit from temporary means of  nutrition and hydration.  Recommend repeat MBS in 2 to 3 days  with clinical improvement.  ST to follow in  acute care setting to  address dysphagia to resume PO's.    Treatment Recommendation  Therapy as outlined in treatment plan below    Diet Recommendation NPO;Alternative means - temporary   Medication Administration: Crushed with puree    Other  Recommendations Oral Care Recommendations: Oral care QID   Follow Up Recommendations     SNF    Frequency and Duration min 2x/week  2 weeks       SLP Swallow Goals Swallow Study Goal #3 - Progress: Progressing toward goal   General Date of Onset: 24-Jun-2012 HPI: 67 yo male admitted with status epilepticus. Intubated  2/16-2/19. Significant PMHx of vascular dementia, seizures, HTN,  DM, CVA (Remote Lt MCA and Rt basal ganglia infarcts) , Gout.   Remote hx of dysphagia s/p CVA with resolution. Type of Study: Modified Barium Swallowing Study Reason for Referral: Objectively evaluate swallowing function Previous Swallow Assessment: 05/21/10 dysphagia 3/thin , BSE  06/06/12 NPO  Diet Prior to this Study: NPO Temperature Spikes Noted: No Respiratory Status: Room air History of Recent Intubation: Yes Length of Intubations (days): 3 days Date extubated: 06/05/12 Behavior/Cognition: Alert;Cooperative;Pleasant  mood;Confused;Requires cueing Oral Cavity - Dentition:  Edentulous Oral Motor / Sensory Function: Impaired - see Bedside swallow  eval Self-Feeding Abilities: Total assist Patient Positioning: Upright in chair Baseline Vocal Quality: Clear Volitional Cough: Cognitively unable to elicit Volitional Swallow: Unable to elicit Anatomy: Within functional limits Pharyngeal Secretions: Not observed secondary MBS    Reason for Referral Objectively evaluate swallowing function   Oral Phase Oral Preparation/Oral Phase Oral Phase: Impaired Oral - Thin Oral - Thin Teaspoon: Lingual pumping;Incomplete tongue to palate  contact;Reduced posterior propulsion;Holding of bolus;Piecemeal  swallowing;Lingual/palatal residue;Delayed oral transit Oral - Thin Cup: Lingual pumping;Incomplete tongue to palate  contact;Delayed oral transit;Piecemeal swallowing;Lingual/palatal  residue Oral - Solids Oral - Puree: Lingual pumping;Incomplete tongue to palate  contact;Reduced posterior propulsion;Weak lingual  manipulation;Holding of bolus;Lingual/palatal residue;Piecemeal  swallowing;Delayed oral transit   Pharyngeal Phase Pharyngeal Phase Pharyngeal Phase: Impaired Pharyngeal - Thin Pharyngeal - Thin Teaspoon: Delayed swallow initiation;Premature  spillage to valleculae;Reduced tongue base retraction Pharyngeal - Thin Cup: Delayed swallow initiation;Premature  spillage to valleculae;Premature spillage to pyriform  sinuses;Reduced anterior laryngeal mobility;Trace  aspiration;Penetration/Aspiration during swallow;Reduced  airway/laryngeal closure;Reduced tongue base retraction Penetration/Aspiration details (thin cup): Material enters  airway, CONTACTS cords and not ejected out Pharyngeal - Solids Pharyngeal - Puree: Reduced laryngeal elevation;Delayed swallow  initiation;Premature spillage to valleculae;Reduced tongue base  retraction;Pharyngeal residue - valleculae  Cervical Esophageal Phase    GO             Moreen Fowler MS, CCC-SLP [phone] [facility] 06/08/2012, 4:44 PM      Scheduled  Meds: . antiseptic oral rinse  1 application Mouth Rinse QID  . cefTRIAXone (ROCEPHIN)  IV  1 g Intravenous Q24H  . chlorhexidine  15 mL Mouth/Throat BID  . cloNIDine  0.3 mg Oral TID  . heparin subcutaneous  5,000 Units Subcutaneous Q8H  . hydrALAZINE  25 mg Oral Q8H  . insulin aspart  0-20 Units Subcutaneous Q4H  . levETIRAcetam  1,500 mg Intravenous Q12H  . metoprolol tartrate  100 mg Oral BID  . pantoprazole (PROTONIX) IV  40 mg Intravenous Q24H   Continuous Infusions: . dextrose 5 % and 0.9% NaCl 50 mL/hr at 06/08/12 1551  . feeding supplement (JEVITY 1.2 CAL)      Active Problems:   Acute respiratory failure   Aspiration pneumonia        Robert Mcclure  [facility] Pager [phone] If 8PM-8AM, please contact night-coverage at [email or web address], password [facility] 06/09/2012, 12:53 PM  LOS: 7 days

## 2012-06-09 NOTE — Progress Notes (Signed)
CNA made the nurse aware that the patient B/P was 200/109. Patient assessed & found with no c/o pain or discomfort & manual B/P 190/100. Patient was given prn rx for increased B/P. Will reassess & continue to monitor the patient.

## 2012-06-09 NOTE — Progress Notes (Signed)
Patient reassessed to find B/P 187/93 via monitor & 186/110 (manual). No c/o pain or discomfort noted. Patient was given prn rx for B/P. Will continue to monitor the patient.

## 2012-06-10 ENCOUNTER — Inpatient Hospital Stay (HOSPITAL_COMMUNITY): Payer: PRIVATE HEALTH INSURANCE

## 2012-06-10 LAB — GLUCOSE, CAPILLARY
Glucose-Capillary: 131 mg/dL — ABNORMAL HIGH (ref 70–99)
Glucose-Capillary: 163 mg/dL — ABNORMAL HIGH (ref 70–99)
Glucose-Capillary: 174 mg/dL — ABNORMAL HIGH (ref 70–99)
Glucose-Capillary: 198 mg/dL — ABNORMAL HIGH (ref 70–99)

## 2012-06-10 LAB — BASIC METABOLIC PANEL
CO2: 21 mEq/L (ref 19–32)
Calcium: 8.7 mg/dL (ref 8.4–10.5)
Creatinine, Ser: 1.35 mg/dL (ref 0.50–1.35)
GFR calc non Af Amer: 53 mL/min — ABNORMAL LOW (ref >90)
Glucose, Bld: 233 mg/dL — ABNORMAL HIGH (ref 70–99)
Sodium: 150 mEq/L — ABNORMAL HIGH (ref 135–145)

## 2012-06-10 LAB — HEMOGLOBIN A1C: Mean Plasma Glucose: 163 mg/dL — ABNORMAL HIGH (ref <117)

## 2012-06-10 MED ORDER — ASPIRIN 325 MG PO TABS
325.0000 mg | ORAL_TABLET | Freq: Every day | ORAL | Status: DC
  Filled 2012-06-10: qty 1

## 2012-06-10 MED ORDER — CLONIDINE HCL 0.3 MG/24HR TD PTWK
0.3000 mg | MEDICATED_PATCH | TRANSDERMAL | Status: DC
  Administered 2012-06-10 – 2012-06-17 (×2): 0.3 mg via TRANSDERMAL
  Filled 2012-06-10 (×3): qty 1

## 2012-06-10 MED ORDER — ASPIRIN 300 MG RE SUPP
300.0000 mg | Freq: Every day | RECTAL | Status: DC
  Filled 2012-06-10 (×2): qty 1

## 2012-06-10 MED ORDER — JEVITY 1.2 CAL PO LIQD
1000.0000 mL | ORAL | Status: DC
  Filled 2012-06-10 (×4): qty 1000

## 2012-06-10 NOTE — Progress Notes (Addendum)
[facility] PROGRESS NOTE  Robert Mcclure JXB:[id number] DOB: 01/06/1946 DOA: 05/20/2012  1. Seizure disorder:  Resume keppra.   2. Acute respiratory failure: possibly from aspiration pneumonia. A repeat CXR does not show any new infiltrates and rocephin is discontinued on 2/23.  As per discussion with speech therapist today, she reported that he coughed up brown sputum today, possibly aspirating with hiccups. We will repeat his cxr today and see if he  Has persistent aspiration pneumonia.  He remains afebrile and is non toxic looking.   3. hYpertensive urgency: resume home medications. BP is suboptimally controlled .   4. Acute renal failure >> resolved.  5. Dysphagia: will need repeat MBS when the patient is more stable. He has NG tube put in on 2 /22, which he pulled out, we reinserted it on 2/23 and started him on jevity at 24ml/hr. He had nausea and vomiting on 2/24 morning. We have held his tube feeds and an abd film ruled out any obstruction. We will continue with free water boluses and medications via NG tube.   6. Hypernatremia; his free water deficit is 4.5 liters . We have started him on free water fluid boluses 4 tmes a day. And added dextrose fluids today. Repeat bmp in am shows improvement in his sodium . Continue to monitor his sodium with daily BMP'S.    7. Hypokalemia: replete as needed.   8. H/o CVA: resume aspirin 325  Mg. His dysphagia hasn't improved after 7 days, he doesn't appear to have any focal deficits and no facial asymmetry. We will do a repeat MRI of the brain for evaluation of new CVA.   9. Diabetes mellitus:  CBG (last 3)   Recent Labs  06/10/12 0007 06/10/12 0449 06/10/12 0715  GLUCAP 174* 198* 198*    Continue with SSI.  hgba1c is pending.   10. Metabolic encephalopathy: probably from hypernatremia and renal insufficiency.   DVT prophylaxis.   Antibiotics:  Rocephin discontinued on 2/22  HPI/Subjective: Denies chest pain  or shortness of breath and headache. Has vomiting  Today.   Objective: Filed Vitals:   06/09/12 1455 06/09/12 1719 06/09/12 2032 06/10/12 0500  BP: 169/88 190/90 143/93 166/93  Pulse: 82  107 101  Temp: 98.4 F (36.9 C)  98.9 F (37.2 C) 98.5 F (36.9 C)  TempSrc: Oral  Oral Oral  Resp: 16  16 18   Height:      Weight:    89.177 kg (196 lb 9.6 oz)  SpO2: 98%  94% 97%    Intake/Output Summary (Last 24 hours) at 06/10/12 1117 Last data filed at 06/10/12 0500  Gross per 24 hour  Intake      0 ml  Output    600 ml  Net   -600 ml   Filed Weights   06/08/12 0500 06/09/12 0500 06/10/12 0500  Weight: 90 kg (198 lb 6.6 oz) 87.8 kg (193 lb 9 oz) 89.177 kg (196 lb 9.6 oz)     PHYSICAL EXAMINATION:  General: No distress  Neuro: Alert  HEENT: No sinus tenderness  Cardiovascular: s1s2 regular, no murmur  Lungs: Decreased breath sounds Lt base  Abdomen: Soft , non tender  Musculoskeletal: No edema  Skin: No rash      Data Reviewed: Basic Metabolic Panel:  Recent Labs Lab 06/06/12 0425 06/07/12 0602 06/08/12 1400 06/09/12 0550 06/10/12 0700  NA 149* 148* 147* 152* 150*  K 3.5 3.5 3.1* 3.1* 3.7  CL 114* 113* 115* 117* 119*  CO2 22 21 22 21 21   GLUCOSE 92 102* 162* 160* 233*  BUN 22 17 24* 24* 22  CREATININE 1.33 1.12 1.50* 1.41* 1.35  CALCIUM 7.9* 8.3* 8.2* 8.9 8.7   Liver Function Tests:  Recent Labs Lab 06/03/12 1626 06/04/12 0500  ALBUMIN 2.9* 2.7*   No results found for this basename: LIPASE, AMYLASE,  in the last 168 hours No results found for this basename: AMMONIA,  in the last 168 hours CBC:  Recent Labs Lab 06/04/12 0500 06/05/12 0522 06/06/12 0425 06/07/12 0602  WBC 7.6 5.3 5.3 4.2  HGB 10.4* 10.1* 10.3* 10.7*  HCT 32.2* 31.3* 31.5* 32.7*  MCV 95.3 95.4 94.3 94.0  PLT 120* 128* 135* 154   Cardiac Enzymes: No results found for this basename: CKTOTAL, CKMB, CKMBINDEX, TROPONINI,  in the last 168 hours BNP (last 3 results) No results  found for this basename: PROBNP,  in the last 8760 hours CBG:  Recent Labs Lab 06/09/12 1718 06/09/12 2030 06/10/12 0007 06/10/12 0449 06/10/12 0715  GLUCAP 168* 168* 174* 198* 198*    Recent Results (from the past 240 hour(s))  URINE CULTURE     Status: None   Collection Time    06/06/2012 12:11 PM      Result Value Range Status   Specimen Description URINE, CATHETERIZED   Final   Special Requests NONE   Final   Culture  Setup Time 06/03/2012 20:14   Final   Colony Count NO GROWTH   Final   Culture NO GROWTH   Final   Report Status 06/03/2012 FINAL   Final  URINE CULTURE     Status: None   Collection Time    05/26/2012  5:45 PM      Result Value Range Status   Specimen Description URINE, CATHETERIZED   Final   Special Requests NONE   Final   Culture  Setup Time 06/03/2012 02:01   Final   Colony Count NO GROWTH   Final   Culture NO GROWTH   Final   Report Status 06/04/2012 FINAL   Final  MRSA PCR SCREENING     Status: None   Collection Time    06/13/2012  6:13 PM      Result Value Range Status   MRSA by PCR NEGATIVE  NEGATIVE Final   Comment:            The GeneXpert MRSA Assay (FDA     approved for NASAL specimens     only), is one component of a     comprehensive MRSA colonization     surveillance program. It is not     intended to diagnose MRSA     infection nor to guide or     monitor treatment for     MRSA infections.  CULTURE, BLOOD (ROUTINE X 2)     Status: None   Collection Time    06/03/12  1:37 AM      Result Value Range Status   Specimen Description BLOOD RIGHT RADIAL ARTERIAL   Final   Special Requests BOTTLES DRAWN AEROBIC AND ANAEROBIC 5CC EA   Final   Culture  Setup Time 06/03/2012 08:25   Final   Culture NO GROWTH 5 DAYS   Final   Report Status 06/09/2012 FINAL   Final  CULTURE, BLOOD (ROUTINE X 2)     Status: None   Collection Time    06/03/12  1:50 AM      Result Value Range Status  Specimen Description BLOOD LEFT ARM   Final   Special  Requests BOTTLES DRAWN AEROBIC ONLY 1CC   Final   Culture  Setup Time 06/03/2012 08:25   Final   Culture NO GROWTH 5 DAYS   Final   Report Status 06/09/2012 FINAL   Final  CULTURE, RESPIRATORY (NON-EXPECTORATED)     Status: None   Collection Time    06/03/12  3:51 AM      Result Value Range Status   Specimen Description TRACHEAL ASPIRATE   Final   Special Requests Normal   Final   Gram Stain     Final   Value: ABUNDANT WBC PRESENT, PREDOMINANTLY PMN     RARE SQUAMOUS EPITHELIAL CELLS PRESENT     MODERATE GRAM POSITIVE COCCI IN PAIRS     RARE GRAM POSITIVE RODS   Culture Non-Pathogenic Oropharyngeal-type Flora Isolated.   Final   Report Status 06/05/2012 FINAL   Final  STOOL CULTURE     Status: None   Collection Time    06/04/12  6:47 PM      Result Value Range Status   Specimen Description STOOL   Final   Special Requests NONE   Final   Culture     Final   Value: NO SALMONELLA, SHIGELLA, CAMPYLOBACTER, YERSINIA, OR E.COLI 0157:H7 ISOLATED   Report Status 06/08/2012 FINAL   Final     Studies: Dg Abd Portable 2v  06/10/2012  *RADIOLOGY REPORT*  Clinical Data: Evaluate for obstruction.  PORTABLE ABDOMEN - 2 VIEW  Comparison: 06/03/2012  Findings: There is enteric tube with tip over the stomach in the left upper quadrant.  Bowel gas pattern is nonobstructive. There is no evidence of free peritoneal air on the decubitus films.  There is minimal residual contrast in the right colon.  There are mild degenerative changes of the spine and hips.  Remainder of the exam is unchanged.  IMPRESSION: Nonspecific, nonobstructive bowel gas pattern.  No free air.   Original Report Authenticated By: Elberta Fortis, M.D.    Dg Swallowing Func-speech Pathology  06/08/2012  Lacinda Axon, CCC-SLP     06/08/2012  5:05 PM Objective Swallowing Evaluation: Modified Barium Swallowing Study   Patient Details  Name: THEOPLIS GARCIAGARCIA MRN: [id number] Date of Birth: [birth year]  Today's Date: 06/08/2012 Time:  5621-3086 SLP Time Calculation (min): 38 min  Past Medical History:  Past Medical History  Diagnosis Date  . Hypertension   . Diabetes mellitus   . CVA (cerebral vascular accident)   . Stroke    Past Surgical History: History reviewed. No pertinent past  surgical history. HPI:  67 yo male admitted with status epilepticus. Intubated 2/16-2/19.  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  Remote hx  of dysphagia s/p CVA with resolution.  Objective assessment of  MBS indicated to assess risk for aspiration and recommend safest  diet consistency.    Assessment / Plan / Recommendation Clinical Impression  Dysphagia Diagnosis: Severe oral phase dysphagia;Mild pharyngeal  phase dysphagia Severe sensory motor oral dysphagia marked by poor oral  awareness, lingual pumping,oral holding and piecemeal swallows.  Severe delay noted in anterior to posterior propulsion.    Mild  to moderate sensory motor pharyngeal dysphagia. Patient with  hiccoughing prior to PO trials.    Initial swallow of thin barium  administered by spoon WFL with no penetration or aspiration  noted.  Trial of thin by cup resulted  in premature spillage to  vallecular space due to reduced TBR.  Thin liquid bolus  eventually spilled to pyriforms prior to initiating swallow.   Silent aspiration occurred during the swallow at moment patient  hiccoughed.  Initial swallow of puree WFL but patient with  piecemeal swallow with subsequent spillage to vallecular space.   Patient unable to complete necessary second swallow to clear  residue even with max verbal and tactile cues due to intermittent  ability to follow directions increasing risk for aspiration after  the swallow.  Recommend to continue NPO status due to patient  with decreased ability to protect his airway mainly due to  combination of decreased cognitive status and severity of the  oral phase.  Patient may benefit from temporary  means of  nutrition and hydration.  Recommend repeat MBS in 2 to 3 days  with clinical improvement.  ST to follow in acute care setting to  address dysphagia to resume PO's.    Treatment Recommendation  Therapy as outlined in treatment plan below    Diet Recommendation NPO;Alternative means - temporary   Medication Administration: Crushed with puree    Other  Recommendations Oral Care Recommendations: Oral care QID   Follow Up Recommendations     SNF    Frequency and Duration min 2x/week  2 weeks       SLP Swallow Goals Swallow Study Goal #3 - Progress: Progressing toward goal   General Date of Onset: Jun 30, 2012 HPI: 67 yo male admitted with status epilepticus. Intubated  2/16-2/19. Significant PMHx of vascular dementia, seizures, HTN,  DM, CVA (Remote Lt MCA and Rt basal ganglia infarcts) , Gout.   Remote hx of dysphagia s/p CVA with resolution. Type of Study: Modified Barium Swallowing Study Reason for Referral: Objectively evaluate swallowing function Previous Swallow Assessment: 05/21/10 dysphagia 3/thin , BSE  06/06/12 NPO  Diet Prior to this Study: NPO Temperature Spikes Noted: No Respiratory Status: Room air History of Recent Intubation: Yes Length of Intubations (days): 3 days Date extubated: 06/05/12 Behavior/Cognition: Alert;Cooperative;Pleasant  mood;Confused;Requires cueing Oral Cavity - Dentition: Edentulous Oral Motor / Sensory Function: Impaired - see Bedside swallow  eval Self-Feeding Abilities: Total assist Patient Positioning: Upright in chair Baseline Vocal Quality: Clear Volitional Cough: Cognitively unable to elicit Volitional Swallow: Unable to elicit Anatomy: Within functional limits Pharyngeal Secretions: Not observed secondary MBS    Reason for Referral Objectively evaluate swallowing function   Oral Phase Oral Preparation/Oral Phase Oral Phase: Impaired Oral - Thin Oral - Thin Teaspoon: Lingual pumping;Incomplete tongue to palate  contact;Reduced posterior propulsion;Holding of bolus;Piecemeal   swallowing;Lingual/palatal residue;Delayed oral transit Oral - Thin Cup: Lingual pumping;Incomplete tongue to palate  contact;Delayed oral transit;Piecemeal swallowing;Lingual/palatal  residue Oral - Solids Oral - Puree: Lingual pumping;Incomplete tongue to palate  contact;Reduced posterior propulsion;Weak lingual  manipulation;Holding of bolus;Lingual/palatal residue;Piecemeal  swallowing;Delayed oral transit   Pharyngeal Phase Pharyngeal Phase Pharyngeal Phase: Impaired Pharyngeal - Thin Pharyngeal - Thin Teaspoon: Delayed swallow initiation;Premature  spillage to valleculae;Reduced tongue base retraction Pharyngeal - Thin Cup: Delayed swallow initiation;Premature  spillage to valleculae;Premature spillage to pyriform  sinuses;Reduced anterior laryngeal mobility;Trace  aspiration;Penetration/Aspiration during swallow;Reduced  airway/laryngeal closure;Reduced tongue base retraction Penetration/Aspiration details (thin cup): Material enters  airway, CONTACTS cords and not ejected out Pharyngeal - Solids Pharyngeal - Puree: Reduced laryngeal elevation;Delayed swallow  initiation;Premature spillage to valleculae;Reduced tongue base  retraction;Pharyngeal residue - valleculae  Cervical Esophageal Phase    GO             Clydie Braun  Dankof MS, CCC-SLP [phone] [facility] 06/08/2012, 4:44 PM      Scheduled Meds: . amLODipine  10 mg Oral Daily  . antiseptic oral rinse  1 application Mouth Rinse QID  . chlorhexidine  15 mL Mouth/Throat BID  . cloNIDine  0.3 mg Oral TID  . heparin subcutaneous  5,000 Units Subcutaneous Q8H  . hydrALAZINE  50 mg Oral Q8H  . insulin aspart  0-5 Units Subcutaneous QHS  . insulin aspart  0-9 Units Subcutaneous TID WC  . levETIRAcetam  1,500 mg Intravenous Q12H  . metoprolol tartrate  100 mg Oral BID  . pantoprazole  40 mg Oral Q0600   Continuous Infusions: . dextrose 50 mL/hr at 06/10/12 1018  . feeding supplement (JEVITY 1.2 CAL)      Active Problems:   Acute respiratory  failure   Aspiration pneumonia        Yury Schaus  [facility] Pager [phone] If 8PM-8AM, please contact night-coverage at [email or web address], password [facility] 06/10/2012, 11:17 AM  LOS: 8 days     Around 3 30 he had seizure like activity with rolling of eyes and a brief period of unresponsiveness. He opened his eyes briefly after the episode on verbal command. He got 2 mg of ativan push and his seizure resolved. He was also found to be hypertensive and tachycardic. He is currently getting IV labetolol and IV hydralazine. Family at bedside and updated. Agreed for a palliative care consult .   Kathlen Mody, MD.

## 2012-06-10 NOTE — Progress Notes (Signed)
Dr. Blake Divine notified of pt's N/V.  Orders received.  Will hold tube feedings, place NG to LIWS, and get 2 view abdomen XR per MD orders.  Report given to day shift RN.

## 2012-06-10 NOTE — Progress Notes (Signed)
Speech Language Pathology Dysphagia Treatment Patient Details Name: Robert Mcclure MRN: [id number] DOB: 1945/12/14 Today's Date: 06/10/2012 Time: 0945-1000 SLP Time Calculation (min): 15 min  Assessment / Plan / Recommendation Clinical Impression  Pt with no evidence of functional change today with trials of ice chips and small sips of thin liquids. Pt continue to have constant hiccoughing, minimal oral manipulation of POs with very delayed swallow response, indicative of severe oral phase dysphagia seen on MBS two days ago. Pt also with concerning black brown material coming up through NG tube (tube feeds held). Informed RN and MD. Pt is not ready for oral consumption at this time. Question if GI referral warranted. SLP will f/u in am tomorrow to check for MBS readiness.     Diet Recommendation  Continue with Current Diet: NPO    SLP Plan Continue with current plan of care   Pertinent Vitals/Pain NA   Swallowing Goals  SLP Swallowing Goals Goal #3: Pt will participate in PO trials with sufficient attention to bolus 80% of trials. Swallow Study Goal #3 - Progress: Progressing toward goal  General Temperature Spikes Noted: No Respiratory Status: Room air Behavior/Cognition: Alert;Confused;Requires cueing;Decreased sustained attention Oral Cavity - Dentition: Edentulous Patient Positioning: Upright in bed  Oral Cavity - Oral Hygiene Does patient have any of the following "at risk" factors?: Nutritional status - inadequate;Other - dysphagia Patient is HIGH RISK - Oral Care Protocol followed (see row info): Yes   Dysphagia Treatment Treatment focused on: Upgraded PO texture trials;Facilitation of pharyngeal phase Treatment Methods/Modalities: Skilled observation;Differential diagnosis Patient observed directly with PO's: Yes Type of PO's observed: Thin liquids;Ice chips Feeding: Total assist Liquids provided via: Teaspoon Oral Phase Signs & Symptoms: Prolonged oral  phase Pharyngeal Phase Signs & Symptoms: Suspected delayed swallow initiation Type of cueing: Verbal Amount of cueing: Moderate   GO     Robert Mcclure, Riley Nearing 06/10/2012, 10:30 AM

## 2012-06-10 NOTE — Clinical Documentation Improvement (Signed)
Hypertension Documentation Clarification Query  THIS DOCUMENT IS NOT A PERMANENT PART OF THE MEDICAL RECORD         06/10/12  Dear Dr. Blake Divine,  In an effort to better capture your patient's severity of illness, reflect appropriate length of stay and utilization of resources, a review of the patient medical record has revealed the following indicators.   Based on your clinical judgment, please clarify and document in a progress note and/or discharge summary the clinical condition associated with the following supporting information: In responding to this query please exercise your independent judgment.  The fact that a query is asked, does not imply that any particular answer is desired or expected.   Thank you for capturing "Hypertension Urgency" in the progress notes. If possible, could you please help by clarify the written diagnosis further. Thank you!   Possible Clinical Conditions?  - Malignant HTN  (BP > 170/110 - with vague symptoms)  - Accelerated HTN  (BP > 180/120 - with evidence of end organ damage)  - Other condition (please document in the progress notes and/or discharge summary)  - Cannot Clinically determine at this time        additional documentation in chart upon review. SM    Thank You,  Saul Fordyce  Clinical Documentation Specialist: [phone] Pager  Health Information Management [place]

## 2012-06-10 NOTE — Progress Notes (Signed)
Palliative Medicine Team consult for goals of care requested by Dr Blake Divine; patient seen at bedside alert, eyes open, not following commands,non-verbal; no family at bedside patient's sister had just left per staff RN -contacted [name] [phone]) she stated she was patient's sister and has helped patient with decision making; she stated he has other sisters in the [place] area and would contact them to see if they would be available to come tomorrow to meet - meeting time confirmed for Tuesday, 06/11/12 @ 12 noon  Valente David, RN 06/10/2012, 5:27 PM Palliative Medicine Team RN Liaison [phone]

## 2012-06-10 NOTE — Progress Notes (Signed)
I agree with the following treatment note after reviewing documentation.   Johnston, Diego Delancey Brynn   OTR/L Pager: [phone] Office: [phone] .   

## 2012-06-10 NOTE — Progress Notes (Signed)
Hydralazine and protonix not given this am d/t patient vomiting.  At this time, the free water flush is infusing per NG tube via feeding pump.  Tube feeding turned off.  150 mL of residuals obtained from NG.  MD paged.

## 2012-06-10 NOTE — Progress Notes (Signed)
Pt had witnessed seizures lasting approximatively 10 minutes preceded by gaging and sustaining increased HR on the monitor. Dr Blake Divine notified and came in to assess. New orders received.

## 2012-06-10 NOTE — Progress Notes (Addendum)
NUTRITION FOLLOW UP  Intervention:    When pt medically appropriate to resume EN, recommend Jevity 1.2 formula ---> initiate at 20 ml/hr and increase by 10 ml every 4 hours to goal rate of 70 ml/hr with Prostat liquid protein 30 ml daily to provide 2116 total kcals, 108 gm protein, 1356 ml of free water RD to follow for nutrition care plan  Nutrition Dx:   Inadequate oral intake related to inability to eat as evidenced by NPO status. -  ongoing  Goal:   EN support to meet >/= 90% of estimated nutrition needs, currently unmet  Monitor:    EN toleration, PO diet advancement, weight, labs, I/O's  Assessment:   MBSS completed 2/22. SLP recommending NPO status.   2/22 NG tube placed (tip in body of stomach) and Jevity 1.2 initiated at 20 ml/hr and ordered to increase by 10 ml every 4 hours to goal rate of 70 ml/hr.  2/24 Pt vomiting. 150 mL of residuals obtained from NG.  Tube feeding turned off. Free water flush is infusing per NG tube via feeding pump. NG now placed to LIWS. Per RN, Jevity 1.2 was infusing at 20 ml/hr prior to being held.    2/24 Adult enteral protocol orders placed per Dietetic Intern for residual guidelines. RD cosigned.   Height: Ht Readings from Last 1 Encounters:  05/27/2012 6' (1.829 m)    Weight Status:   Wt Readings from Last 10 Encounters:  06/10/12 196 lb 9.6 oz (89.177 kg)  01/23/12 206 lb 12.7 oz (93.8 kg)  Admit weight: 05/28/2012 - 206 lbs - weight trending down since admission   Body mass index is 26.66 kg/(m^2). - WNL  Re-estimated needs:  Kcal: 2000-2200 Protein: 105-115 gm Fluid: 2.0-2.2 L  Skin: Intact  Diet Order: NPO  Last BM: 06/07/2012  Labs:   Recent Labs Lab 06/07/12 0602 06/08/12 1400 06/09/12 0550  NA 148* 147* 152*  K 3.5 3.1* 3.1*  CL 113* 115* 117*  CO2 21 22 21   BUN 17 24* 24*  CREATININE 1.12 1.50* 1.41*  CALCIUM 8.3* 8.2* 8.9  GLUCOSE 102* 162* 160*    CBG (last 3)   Recent Labs  06/10/12 0007  06/10/12 0449 06/10/12 0715  GLUCAP 174* 198* 198*    Scheduled Meds: . amLODipine  10 mg Oral Daily  . antiseptic oral rinse  1 application Mouth Rinse QID  . chlorhexidine  15 mL Mouth/Throat BID  . cloNIDine  0.3 mg Oral TID  . heparin subcutaneous  5,000 Units Subcutaneous Q8H  . hydrALAZINE  50 mg Oral Q8H  . insulin aspart  0-5 Units Subcutaneous QHS  . insulin aspart  0-9 Units Subcutaneous TID WC  . levETIRAcetam  1,500 mg Intravenous Q12H  . metoprolol tartrate  100 mg Oral BID  . pantoprazole  40 mg Oral Q0600    Continuous Infusions: . dextrose 50 mL/hr at 06/09/12 1413  . feeding supplement (JEVITY 1.2 CAL)     Belenda Cruise  Dietetic Intern Pager: [phone]  Maureen Chatters, RD, LDN Pager #: [phone] After-Hours Pager #: [phone]

## 2012-06-10 NOTE — Progress Notes (Signed)
Per Dr. Blake Divine, will hold tube feedings until after chest xray. Will reconsider this afternoon. However, continue medications.

## 2012-06-10 NOTE — Progress Notes (Addendum)
PT OT Cancellation Note  Patient Details Name: Robert Mcclure MRN: [id number] DOB: Mar 03, 1946   Cancelled Treatment:    Reason Eval/Treat Not Completed: Patient at procedure or test/unavailable.  At MRI this pm.  06/10/2012  Franklin Bing, PT [phone] (pager)   Isha Seefeld, Eliseo Gum 06/10/2012, 2:17 PM

## 2012-06-10 NOTE — Progress Notes (Signed)
NG Tube advanced  8 cm per radiology recommendation. Dr Blake Divine made aware.

## 2012-06-11 DIAGNOSIS — G9341 Metabolic encephalopathy: Secondary | ICD-10-CM | POA: Diagnosis present

## 2012-06-11 DIAGNOSIS — R1310 Dysphagia, unspecified: Secondary | ICD-10-CM

## 2012-06-11 DIAGNOSIS — R4182 Altered mental status, unspecified: Secondary | ICD-10-CM

## 2012-06-11 LAB — CBC
HCT: 33.1 % — ABNORMAL LOW (ref 39.0–52.0)
Hemoglobin: 10.8 g/dL — ABNORMAL LOW (ref 13.0–17.0)
MCH: 30.8 pg (ref 26.0–34.0)
MCHC: 32.6 g/dL (ref 30.0–36.0)
MCV: 94.3 fL (ref 78.0–100.0)
Platelets: 232 10*3/uL (ref 150–400)
RBC: 3.51 MIL/uL — ABNORMAL LOW (ref 4.22–5.81)
RDW: 14.8 % (ref 11.5–15.5)
WBC: 6 10*3/uL (ref 4.0–10.5)

## 2012-06-11 LAB — OCCULT BLOOD GASTRIC / DUODENUM (SPECIMEN CUP): Occult Blood, Gastric: POSITIVE — AB

## 2012-06-11 LAB — LEVETIRACETAM LEVEL: Levetiracetam Lvl: 63.1 ug/mL — ABNORMAL HIGH (ref 5.0–30.0)

## 2012-06-11 LAB — BASIC METABOLIC PANEL
Chloride: 118 mEq/L — ABNORMAL HIGH (ref 96–112)
Creatinine, Ser: 1.43 mg/dL — ABNORMAL HIGH (ref 0.50–1.35)
GFR calc Af Amer: 57 mL/min — ABNORMAL LOW (ref >90)

## 2012-06-11 LAB — GLUCOSE, CAPILLARY: Glucose-Capillary: 167 mg/dL — ABNORMAL HIGH (ref 70–99)

## 2012-06-11 MED ORDER — ONDANSETRON HCL 4 MG/2ML IJ SOLN
4.0000 mg | Freq: Four times a day (QID) | INTRAMUSCULAR | Status: DC | PRN
  Administered 2012-06-11 – 2012-06-14 (×2): 4 mg via INTRAVENOUS
  Filled 2012-06-11 (×2): qty 2

## 2012-06-11 MED ORDER — DEXTROSE 5 % IV SOLN
INTRAVENOUS | Status: DC
  Administered 2012-06-11 – 2012-06-16 (×5): via INTRAVENOUS

## 2012-06-11 MED ORDER — PANTOPRAZOLE SODIUM 40 MG IV SOLR
40.0000 mg | Freq: Every day | INTRAVENOUS | Status: DC
  Administered 2012-06-11 – 2012-06-15 (×5): 40 mg via INTRAVENOUS
  Filled 2012-06-11 (×7): qty 40

## 2012-06-11 MED ORDER — CHLORPROMAZINE HCL 25 MG/ML IJ SOLN
12.5000 mg | Freq: Three times a day (TID) | INTRAMUSCULAR | Status: DC | PRN
  Administered 2012-06-12 – 2012-06-13 (×4): 12.5 mg via INTRAVENOUS
  Filled 2012-06-11 (×6): qty 0.5

## 2012-06-11 MED ORDER — INSULIN ASPART 100 UNIT/ML ~~LOC~~ SOLN
0.0000 [IU] | SUBCUTANEOUS | Status: DC
  Administered 2012-06-11: 1 [IU] via SUBCUTANEOUS
  Administered 2012-06-12: 2 [IU] via SUBCUTANEOUS

## 2012-06-11 NOTE — Progress Notes (Signed)
Have collaborated with this patient and note. 06/11/2012  Briscoe Bing, PT [phone] (pager)

## 2012-06-11 NOTE — Progress Notes (Signed)
Pt admitted to 3309 from 3 west. Oriented to unit and room. Patient is in safety mittens and bed alarm is set. Placed NGT to low wall suction with coffee grind output. Notified md and clarified orders. Pt is easy to arrouse and vss. Will continue to monitor.  Elijah Birk, RN

## 2012-06-11 NOTE — Progress Notes (Signed)
[facility] PROGRESS NOTE  BANE HAGY ZOX:[id number] DOB: 05-06-1945 DOA: Jun 11, 2012  Brief History:   67 yo male admitted with status epilepticus. Intubated for airway protection. PCCM admitted the patient and intubated for airway protection on 2/16 and extubated on 2/19 and transferred him to [facility] on 2/21. On 2/21 he was found to be hypernatremic, in hypertensive urgency and had to be transferred to step down for starting IV labetalol drip. He was subsequently started on home anti hypertensive's  given to him orally with puree as per the SLP recommendations. He failed MBS on 2/23 with high risk of aspiration. An NG tube was placed temporarily and feeds started with Jevity was transferred back to the floor on 2/23. He continued to decline clinically and on 2/24 he had another seizure activity. He underwent an MRI of the brain on  2/24  Showed no acute infarct and remote mod to large left hemispheric infarct with encephalomalacia. His repeat CXR did not show any infiltrates and he remained afebrile without any leukocytosis. A palliative care consult was requested and had a goals of care meeting at 12 on 2/25. As per the discussion with palliative care, Family members would like some time to come to terms with his prognosis and re meet on Thursday. They would like to continue with active treatments at this time.       1. Seizure disorder:  Last seizure activity was yesterday, resolved with 2 mg IV ativan. keppra level was 63. We will hold keppra and repeat level in am. Consider resuming keppra tomorrow after the level is therapeutic.   2. Acute respiratory failure: on admission possibly from aspiration pneumonia. A repeat CXR does not show any new infiltrates and rocephin is discontinued on 2/22.  As per discussion with speech therapist on 2/23 she reported that he coughed up brown sputum today, possibly aspirating with hiccups. We have repeated his cxr and did not see  any new infiltrates.  He remains afebrile .  3.Malignant Hypertension: at home patient has been on mulitple anti hypertensive medication including, clonidine, b blockers, which have been held for status epilepticus. He went into rebound hypertension. His home medications were restarted to be given by mouth with puree and via NG tube because of his dysphagia. But currently all oral medications are on hold because of nausea/ vomiting and he is currently on NG low intermittent suction. He is on IV labetolol, IV hydralazine and clonidine patch.   4. Acute on chronic kidney disease stage 3 :  I/O last 3 completed shifts: In: -  Out: 1150 [Urine:1150]   Probably pre renal because of his inability to take po. We have started on him dextrose IV at a slow rate.    5. Dysphagia: of unclear etiology. His MRI does not show an acute stroke, but showed a large to moderate left hemisphere cva with encephalomalacia. Speech and swallow evaluation on board and he underwent MBS but failed because of his hiccups.  will need repeat MBS when the patient is more stable. He has NG tube put in on 2 /22, which he pulled out, we reinserted it on 2/23 and started him on jevity at 27ml/hr. He had nausea and vomiting on 2/24 morning. We have held his tube feeds and an abd film ruled out any obstruction. We have started him on low intermittent suction and his gastric secretions were positive for blood. He was started on IV protonix. Spoke at length with family members his sister, daughter  and brother and discussed his prognosis and the alternative measures of nutrition. They will get back to [place] on Thursday, meanwhile we are to continue with the current treatment.   6. Hypernatremia; his free water deficit is 4.5 liters . We have started him on free water fluid boluses 4 tmes a day. And added dextrose fluids.  Continue to monitor his sodium with daily BMP'S.    7. Hypokalemia: replete as needed.   8. H/o CVA: aspirin was  held for positive blood on GI secretions. His dysphagia hasn't improved after 10 days, . MRI brain does not show any acute infarct.  9. Diabetes mellitus:  CBG (last 3)   Recent Labs  06/10/12 2024 06/11/12 0338 06/11/12 0738  GLUCAP 145* 167* 182*    Continue with SSI.  hgba1c is 7  10. Metabolic encephalopathy: probably from hypernatremia and renal insufficiency/ seizures. He is briefly opening his eyes to verbal commands. He appears to have recognized his family members today. He denies any pain.   11. Anemia: normocytic. And stable.   DVT prophylaxis on SCD'S   FULL CODE Poor Prognosis.  Discussed with sister Tyler Aas .   Antibiotics:  Rocephin discontinued on 2/22  LINES / TUBES:  2/16 ETT >>2/19  2/17 Rt IJ CVL >> 2/19  CULTURES:  2/16 BC x 2 >> negative. 2/16 UC >> negative  2/17 Sputum >> oral flora  2/17 Pneumococcal Ag >> negative  2/17 Legionella Ag >> negative  ANTIBIOTICS:  2/16 Vancomycin >> 2/20  2/16 Zosyn >> 2/20 2/20 rocephin>> 2/22.   HPI/Subjective: Minimally responsive. Family at bedside.   Objective: Filed Vitals:   06/11/12 0101 06/11/12 0331 06/11/12 0634 06/11/12 0641  BP: 194/99 186/119 178/90 193/112  Pulse: 127 112 116 120  Temp:   97 F (36.1 C)   TempSrc:   Oral   Resp:   19   Height:      Weight:      SpO2: 100% 99% 98%     Intake/Output Summary (Last 24 hours) at 06/11/12 0858 Last data filed at 06/11/12 0500  Gross per 24 hour  Intake      0 ml  Output    550 ml  Net   -550 ml   Filed Weights   06/08/12 0500 06/09/12 0500 06/10/12 0500  Weight: 90 kg (198 lb 6.6 oz) 87.8 kg (193 lb 9 oz) 89.177 kg (196 lb 9.6 oz)     PHYSICAL EXAMINATION:  General: No distress , constant hiccups.  Neuro: Alert  HEENT: No sinus tenderness  Cardiovascular: s1s2 regular, no murmur  Lungs: Decreased breath sounds Lt base  Abdomen: Soft , non tender  Musculoskeletal: No edema  Skin: No rash  Neuro: arousable, no t oriented.  Briefly waking up to verbal commands.      Data Reviewed: Basic Metabolic Panel:  Recent Labs Lab 06/07/12 0602 06/08/12 1400 06/09/12 0550 06/10/12 0700 06/11/12 0540  NA 148* 147* 152* 150* 152*  K 3.5 3.1* 3.1* 3.7 3.8  CL 113* 115* 117* 119* 118*  CO2 21 22 21 21 20   GLUCOSE 102* 162* 160* 233* 204*  BUN 17 24* 24* 22 23  CREATININE 1.12 1.50* 1.41* 1.35 1.43*  CALCIUM 8.3* 8.2* 8.9 8.7 8.4   Liver Function Tests: No results found for this basename: AST, ALT, ALKPHOS, BILITOT, PROT, ALBUMIN,  in the last 168 hours No results found for this basename: LIPASE, AMYLASE,  in the last 168 hours No results found for  this basename: AMMONIA,  in the last 168 hours CBC:  Recent Labs Lab 06/05/12 0522 06/06/12 0425 06/07/12 0602 06/11/12 0540  WBC 5.3 5.3 4.2 6.0  HGB 10.1* 10.3* 10.7* 10.8*  HCT 31.3* 31.5* 32.7* 33.1*  MCV 95.4 94.3 94.0 94.3  PLT 128* 135* 154 232   Cardiac Enzymes: No results found for this basename: CKTOTAL, CKMB, CKMBINDEX, TROPONINI,  in the last 168 hours BNP (last 3 results) No results found for this basename: PROBNP,  in the last 8760 hours CBG:  Recent Labs Lab 06/10/12 1515 06/10/12 1710 06/10/12 2024 06/11/12 0338 06/11/12 0738  GLUCAP 131* 163* 145* 167* 182*    Recent Results (from the past 240 hour(s))  URINE CULTURE     Status: None   Collection Time    06/12/2012 12:11 PM      Result Value Range Status   Specimen Description URINE, CATHETERIZED   Final   Special Requests NONE   Final   Culture  Setup Time 05/18/2012 20:14   Final   Colony Count NO GROWTH   Final   Culture NO GROWTH   Final   Report Status 06/03/2012 FINAL   Final  URINE CULTURE     Status: None   Collection Time    06/12/2012  5:45 PM      Result Value Range Status   Specimen Description URINE, CATHETERIZED   Final   Special Requests NONE   Final   Culture  Setup Time 06/03/2012 02:01   Final   Colony Count NO GROWTH   Final   Culture NO GROWTH    Final   Report Status 06/04/2012 FINAL   Final  MRSA PCR SCREENING     Status: None   Collection Time    06/01/2012  6:13 PM      Result Value Range Status   MRSA by PCR NEGATIVE  NEGATIVE Final   Comment:            The GeneXpert MRSA Assay (FDA     approved for NASAL specimens     only), is one component of a     comprehensive MRSA colonization     surveillance program. It is not     intended to diagnose MRSA     infection nor to guide or     monitor treatment for     MRSA infections.  CULTURE, BLOOD (ROUTINE X 2)     Status: None   Collection Time    06/03/12  1:37 AM      Result Value Range Status   Specimen Description BLOOD RIGHT RADIAL ARTERIAL   Final   Special Requests BOTTLES DRAWN AEROBIC AND ANAEROBIC 5CC EA   Final   Culture  Setup Time 06/03/2012 08:25   Final   Culture NO GROWTH 5 DAYS   Final   Report Status 06/09/2012 FINAL   Final  CULTURE, BLOOD (ROUTINE X 2)     Status: None   Collection Time    06/03/12  1:50 AM      Result Value Range Status   Specimen Description BLOOD LEFT ARM   Final   Special Requests BOTTLES DRAWN AEROBIC ONLY 1CC   Final   Culture  Setup Time 06/03/2012 08:25   Final   Culture NO GROWTH 5 DAYS   Final   Report Status 06/09/2012 FINAL   Final  CULTURE, RESPIRATORY (NON-EXPECTORATED)     Status: None   Collection Time    06/03/12  3:51  AM      Result Value Range Status   Specimen Description TRACHEAL ASPIRATE   Final   Special Requests Normal   Final   Gram Stain     Final   Value: ABUNDANT WBC PRESENT, PREDOMINANTLY PMN     RARE SQUAMOUS EPITHELIAL CELLS PRESENT     MODERATE GRAM POSITIVE COCCI IN PAIRS     RARE GRAM POSITIVE RODS   Culture Non-Pathogenic Oropharyngeal-type Flora Isolated.   Final   Report Status 06/05/2012 FINAL   Final  STOOL CULTURE     Status: None   Collection Time    06/04/12  6:47 PM      Result Value Range Status   Specimen Description STOOL   Final   Special Requests NONE   Final   Culture      Final   Value: NO SALMONELLA, SHIGELLA, CAMPYLOBACTER, YERSINIA, OR E.COLI 0157:H7 ISOLATED   Report Status 06/08/2012 FINAL   Final     Studies: Dg Chest 2 View  06/10/2012  *RADIOLOGY REPORT*  Clinical Data: Evaluation of aspiration pneumonia.  CHEST - 2 VIEW  Comparison: Chest x-ray 06/07/2012.  Findings: There has been interval placement of a nasogastric tube which extends in the proximal stomach (side port near the gastroesophageal junction).  Lung volumes are normal.  Left retrocardiac opacity favored to reflect predominately subsegmental atelectasis and small left pleural effusion.  Right lung is clear. Pulmonary venous congestion without frank pulmonary edema.  Mild cardiomegaly.  Mediastinal contours are distorted by patient positioning.  Atherosclerosis in the thoracic aorta.  IMPRESSION: 1.  Interval placement of a nasogastric tube which is slightly high in position and could be advanced approximately 8 cm for more optimal placement. 2.  Probable left lower lobe subsegmental atelectasis with superimposed small left pleural effusion. 3.  Mild cardiomegaly. 4.  Atherosclerosis.   Original Report Authenticated By: Trudie Reed, M.D.    Mr Brain Wo Contrast  06/10/2012  *RADIOLOGY REPORT*  Clinical Data: Dysphagia.  Prior infarct.  Evaluate for new infarct.  Diabetic hypertensive patient.  MRI HEAD WITHOUT CONTRAST  Technique:  Multiplanar, multiecho pulse sequences of the brain and surrounding structures were obtained according to standard protocol without intravenous contrast.  Comparison: 21-Jun-2012 head CT.  No comparison brain MR.  Findings: Motion degraded exam.  No acute infarct.  Remote moderate to large left hemispheric infarct with encephalomalacia and Wallerian degeneration.  Marked small vessel disease type changes.  Prominent global atrophy without hydrocephalus.  Part of the left ventricular prominence is related to the prior infarct.  No intracranial hemorrhage.  No intracranial  mass lesion detected on this unenhanced exam.  Major intracranial vascular structures are patent at the level of the sella with diseased appearing left middle cerebral artery consistent with the patient's prior infarct.  Mucosal thickening most notable inferior aspect left maxillary sinus with polypoid appearance.  Cervical spondylotic changes incompletely assessed.  IMPRESSION:  Motion degraded exam.  No acute infarct.  Remote moderate to large left hemispheric infarct with encephalomalacia and Wallerian degeneration.  Marked small vessel disease type changes.  Prominent global atrophy without hydrocephalus.  Part of the left ventricular prominence is related to the prior infarct.  Major intracranial vascular structures are patent at the level of the sella with diseased appearing left middle cerebral artery consistent with the patient's prior infarct.  Mucosal thickening most notable inferior aspect left maxillary sinus with polypoid appearance.   Original Report Authenticated By: Lacy Duverney, M.D.    Dg Abd  Portable 2v  06/10/2012  *RADIOLOGY REPORT*  Clinical Data: Evaluate for obstruction.  PORTABLE ABDOMEN - 2 VIEW  Comparison: 06/03/2012  Findings: There is enteric tube with tip over the stomach in the left upper quadrant.  Bowel gas pattern is nonobstructive. There is no evidence of free peritoneal air on the decubitus films.  There is minimal residual contrast in the right colon.  There are mild degenerative changes of the spine and hips.  Remainder of the exam is unchanged.  IMPRESSION: Nonspecific, nonobstructive bowel gas pattern.  No free air.   Original Report Authenticated By: Elberta Fortis, M.D.     Scheduled Meds: . amLODipine  10 mg Oral Daily  . antiseptic oral rinse  1 application Mouth Rinse QID  . aspirin  300 mg Rectal Daily  . chlorhexidine  15 mL Mouth/Throat BID  . cloNIDine  0.3 mg Transdermal Weekly  . heparin subcutaneous  5,000 Units Subcutaneous Q8H  . hydrALAZINE  50 mg  Oral Q8H  . insulin aspart  0-5 Units Subcutaneous QHS  . insulin aspart  0-9 Units Subcutaneous TID WC  . levETIRAcetam  1,500 mg Intravenous Q12H  . metoprolol tartrate  100 mg Oral BID  . pantoprazole (PROTONIX) IV  40 mg Intravenous Q0600   Continuous Infusions: . dextrose 50 mL/hr at 06/11/12 0821  . feeding supplement (JEVITY 1.2 CAL)      Active Problems:   Acute respiratory failure   Aspiration pneumonia        Robert Mcclure  [facility] Pager [phone] If 8PM-8AM, please contact night-coverage at [email or web address], password [facility] 06/11/2012, 8:58 AM  LOS: 9 days

## 2012-06-11 NOTE — Progress Notes (Signed)
SLP Cancellation Note  Patient Details Name: Robert Mcclure MRN: [id number] DOB: 01/23/1946   Cancelled treatment:        Pt. S/p 10 minute seizure (yesterday afternoon).  Currently patient is minimally responsive (atempts to open eyes and moans briefly), has ongoing hiccoughs, and has dark brown fluid draining from NG tube (TF on hold).  Palliative care team met with family today.  Notes regarding decisions pending.  Currently, patient is not appropriate for po attempts.  SLP will f/u re: POC 2/26.    Maryjo Rochester T 06/11/2012, 2:23 PM

## 2012-06-11 NOTE — Progress Notes (Signed)
Dr Blake Divine on unit, informed of pt having frequent PVC's earlier, in which pt was in trigeminy and quadrgimny. This was not sustained. Dr Blake Divine states she has noted this as well.

## 2012-06-11 NOTE — Consult Note (Signed)
Patient XB:[id number] [place] Mcclure      DOB: 21-Jul-1945      AOZ:[id number]     Consult Note from the Palliative Medicine Team at [facility]    Consult Requested by: Robert Robert Mcclure     PCP: Robert Cos, MD Reason for Consultation: Clarification of GOC and options     Phone [phone]  Assessment of patients Current state: 67 yo male with multiple co-morbidities; CVA, vascular dementia, sizure. Admitted thru ER status epilepticus and intubated for airway protection now with altered mental status, dysphagia.  Further siezure activity and decline--GOC meeting--more time needed for family to come together in decision.  Re meet Thursday at 1100  Consult is for review of medical treatment options, clarification of goals of care and end of life issues, disposition and options, and symptom recommendation.  This NP Robert Mcclure reviewed medical records, received report from team, assessed the patient and then meet at the patient's bedside along with his sister POA Robert Mcclure [phone]    And brother Robert Mcclure# [phone]  to discuss diagnosis prognosis, GOC, EOL wishes disposition and options.   A detailed discussion was had today regarding advanced directives.  Concepts specific to code status, artifical feeding and hydration, continued IV antibiotics and rehospitalization was had.  The difference between a aggressive medical intervention path  and a palliative comfort care path for this patient at this time was had.  Values and goals of care important to patient and family were attempted to be elicited.  Concept of Hospice and Palliative Care were discussed  Natural trajectory and expectations at EOL were discussed.  Questions and concerns addressed.  Hard Choices booklet left for review. Family encouraged to call with questions or concerns.  PMT will continue to support holistically.  Goals of Care: 1.  Code Status: Full code   2. Scope of Treatment: 1.  Continue with present  treatment plan and all available medical interventions in hopes of improvement.  4. Disposition: Dependant on outcomes.  Re meet on Thursday at 1100 to clarify GOC   3. Symptom Management:   1. Primary team managing symptoms.    4. Psychosocial:  Emotional support offered to family at meeting in person and one daughter Robert Mcclure # [phone]) via telephone.    Patient Documents Completed or Given: Document Given Completed  Advanced Directives Pkt    MOST    DNR    Gone from My Sight    Hard Choices  yes    Brief HPI: 67 yo male with h/o CVA/statis epilepticus, altered mental status, dysphagia  Elevated levetiracetam level---family taking time to make decsions,  Re goal for Thursday at 1100  ROS: unable to iilicit    PMH:  Past Medical History  Diagnosis Date  . Hypertension   . Diabetes mellitus   . CVA (cerebral vascular accident)   . Stroke      KVQ:QVZDGLO reviewed. No pertinent past surgical history. I have reviewed the FH and SH and  If appropriate update it with new information. No Known Allergies Scheduled Meds: . amLODipine  10 mg Oral Daily  . antiseptic oral rinse  1 application Mouth Rinse QID  . chlorhexidine  15 mL Mouth/Throat BID  . cloNIDine  0.3 mg Transdermal Weekly  . hydrALAZINE  50 mg Oral Q8H  . insulin aspart  0-5 Units Subcutaneous QHS  . insulin aspart  0-9 Units Subcutaneous TID WC  . metoprolol tartrate  100 mg Oral BID  . pantoprazole (PROTONIX) IV  40 mg  Intravenous Q0600   Continuous Infusions: . dextrose 50 mL/hr at 06/11/12 0821  . feeding supplement (JEVITY 1.2 CAL)     PRN Meds:.acetaminophen (TYLENOL) oral liquid 160 mg/5 mL, chlorproMAZINE, hydrALAZINE, labetalol, LORazepam    BP 170/98  Pulse 95  Temp(Src) 97 F (36.1 C) (Oral)  Resp 19  Ht 6' (1.829 m)  Wt 89.177 kg (196 lb 9.6 oz)  BMI 26.66 kg/m2  SpO2 98%   PPS: 20 % at best   Intake/Output Summary (Last 24 hours) at 06/11/12 1507 Last data filed at  06/11/12 0500  Gross per 24 hour  Intake      0 ml  Output    550 ml  Net   -550 ml    Physical Exam:  General: chronically ill appearing, generally uncomfportable HEENT:  Moist muvcou membranes, NG to wall suct Chest:   Scattered coarse BS CVS: RRR Abdomen: soft NT +BS Ext: without edema Neuro: eyes open/not tracking, unable to follow commands,   Labs: CBC    Component Value Date/Time   WBC 6.0 06/11/2012 0540   RBC 3.51* 06/11/2012 0540   HGB 10.8* 06/11/2012 0540   HCT 33.1* 06/11/2012 0540   PLT 232 06/11/2012 0540   MCV 94.3 06/11/2012 0540   MCH 30.8 06/11/2012 0540   MCHC 32.6 06/11/2012 0540   RDW 14.8 06/11/2012 0540   LYMPHSABS 4.1* Jun 04, 2012 1152   MONOABS 0.7 06-04-2012 1152   EOSABS 0.1 2012/06/04 1152   BASOSABS 0.1 06/04/12 1152    BMET    Component Value Date/Time   NA 152* 06/11/2012 0540   K 3.8 06/11/2012 0540   CL 118* 06/11/2012 0540   CO2 20 06/11/2012 0540   GLUCOSE 204* 06/11/2012 0540   BUN 23 06/11/2012 0540   CREATININE 1.43* 06/11/2012 0540   CALCIUM 8.4 06/11/2012 0540   GFRNONAA 49* 06/11/2012 0540   GFRAA 57* 06/11/2012 0540    CMP     Component Value Date/Time   NA 152* 06/11/2012 0540   K 3.8 06/11/2012 0540   CL 118* 06/11/2012 0540   CO2 20 06/11/2012 0540   GLUCOSE 204* 06/11/2012 0540   BUN 23 06/11/2012 0540   CREATININE 1.43* 06/11/2012 0540   CALCIUM 8.4 06/11/2012 0540   PROT 6.3 06/03/2012 0137   ALBUMIN 2.7* 06/04/2012 0500   AST 44* 06/03/2012 0137   ALT 16 06/03/2012 0137   ALKPHOS 43 06/03/2012 0137   BILITOT 0.5 06/03/2012 0137   GFRNONAA 49* 06/11/2012 0540   GFRAA 57* 06/11/2012 0540      Time In Time Out Total Time Spent with Patient Total Overall Time  1130 1300 80 min 90 min    Greater than 50%  of this time was spent counseling and coordinating care related to the above assessment and plan.  Discussed above with Robert Rhona Raider NP  Palliative Medicine Team Team Phone # [phone] Pager  [phone]

## 2012-06-11 NOTE — Progress Notes (Signed)
CSW rec'd referral for SNF- MD indicates patient may not survive this admission- Palliative Medicine consulted and meeting with family today-  CSW will f/u tomorrow for further assessment and d/c planning- Please call as needed- .jc

## 2012-06-11 NOTE — Progress Notes (Signed)
PT ? OT Cancellation Note  Patient Details Name: BANKS CHAIKIN MRN: [id number] DOB: 24-Jun-1945   Cancelled Treatment:    Reason Eval/Treat Not Completed: Patient not medically ready (holding awaiting palliative results/ RN Tammy request)  Lucile Shutters Pager: [phone]  06/11/2012, 2:23 PM

## 2012-06-12 ENCOUNTER — Inpatient Hospital Stay (HOSPITAL_COMMUNITY): Payer: PRIVATE HEALTH INSURANCE

## 2012-06-12 LAB — GLUCOSE, CAPILLARY
Glucose-Capillary: 152 mg/dL — ABNORMAL HIGH (ref 70–99)
Glucose-Capillary: 158 mg/dL — ABNORMAL HIGH (ref 70–99)
Glucose-Capillary: 92 mg/dL (ref 70–99)
Glucose-Capillary: 145 mg/dL — ABNORMAL HIGH (ref 70–99)

## 2012-06-12 LAB — CBC
HCT: 31.1 % — ABNORMAL LOW (ref 39.0–52.0)
Hemoglobin: 10.2 g/dL — ABNORMAL LOW (ref 13.0–17.0)
MCH: 31.1 pg (ref 26.0–34.0)
MCHC: 32.8 g/dL (ref 30.0–36.0)
MCV: 94.8 fL (ref 78.0–100.0)
Platelets: 169 10*3/uL (ref 150–400)
RBC: 3.28 MIL/uL — ABNORMAL LOW (ref 4.22–5.81)
RDW: 14.8 % (ref 11.5–15.5)
WBC: 4.6 10*3/uL (ref 4.0–10.5)

## 2012-06-12 LAB — BASIC METABOLIC PANEL
BUN: 27 mg/dL — ABNORMAL HIGH (ref 6–23)
CO2: 21 mEq/L (ref 19–32)
Calcium: 8.2 mg/dL — ABNORMAL LOW (ref 8.4–10.5)
Chloride: 119 mEq/L — ABNORMAL HIGH (ref 96–112)
Creatinine, Ser: 1.67 mg/dL — ABNORMAL HIGH (ref 0.50–1.35)
Glucose, Bld: 182 mg/dL — ABNORMAL HIGH (ref 70–99)

## 2012-06-12 LAB — LEVETIRACETAM LEVEL: Levetiracetam Lvl: 37.5 ug/mL — ABNORMAL HIGH (ref 5.0–30.0)

## 2012-06-12 MED ORDER — SODIUM CHLORIDE 0.9 % IV BOLUS (SEPSIS): 1000.0000 mL | Freq: Once | INTRAVENOUS | Status: DC

## 2012-06-12 MED ORDER — PHENYTOIN SODIUM 50 MG/ML IJ SOLN
100.0000 mg | Freq: Three times a day (TID) | INTRAMUSCULAR | Status: DC
  Administered 2012-06-13 – 2012-06-17 (×15): 100 mg via INTRAVENOUS
  Filled 2012-06-12 (×18): qty 2

## 2012-06-12 MED ORDER — FOSPHENYTOIN SODIUM 100 MG PE/2ML IJ SOLN: 20.0000 mg/kg | Freq: Once | INTRAMUSCULAR | Status: DC

## 2012-06-12 MED ORDER — SODIUM CHLORIDE 0.9 % IV SOLN
100.0000 mg | Freq: Two times a day (BID) | INTRAVENOUS | Status: DC
  Administered 2012-06-13 – 2012-06-18 (×11): 100 mg via INTRAVENOUS
  Filled 2012-06-12 (×25): qty 10

## 2012-06-12 MED ORDER — SODIUM CHLORIDE 0.9 % IV SOLN
200.0000 mg | Freq: Two times a day (BID) | INTRAVENOUS | Status: AC
  Administered 2012-06-12: 200 mg via INTRAVENOUS
  Filled 2012-06-12: qty 20

## 2012-06-12 MED ORDER — METOCLOPRAMIDE HCL 5 MG/ML IJ SOLN
5.0000 mg | Freq: Four times a day (QID) | INTRAMUSCULAR | Status: DC
  Administered 2012-06-12 – 2012-06-16 (×15): 5 mg via INTRAVENOUS
  Filled 2012-06-12 (×22): qty 1

## 2012-06-12 MED ORDER — SODIUM CHLORIDE 0.9 % IV SOLN
20.0000 mg/kg | Freq: Once | INTRAVENOUS | Status: AC
  Administered 2012-06-12: 1790 mg via INTRAVENOUS
  Filled 2012-06-12: qty 35.8

## 2012-06-12 MED ORDER — INSULIN ASPART 100 UNIT/ML ~~LOC~~ SOLN
0.0000 [IU] | SUBCUTANEOUS | Status: DC
Start: 2012-06-12 — End: 2012-06-23
  Administered 2012-06-12: 1 [IU] via SUBCUTANEOUS
  Administered 2012-06-12 (×2): 2 [IU] via SUBCUTANEOUS
  Administered 2012-06-12 – 2012-06-13 (×2): 1 [IU] via SUBCUTANEOUS
  Administered 2012-06-13: 2 [IU] via SUBCUTANEOUS
  Administered 2012-06-13: 1 [IU] via SUBCUTANEOUS
  Administered 2012-06-13 (×3): 2 [IU] via SUBCUTANEOUS
  Administered 2012-06-14: 1 [IU] via SUBCUTANEOUS
  Administered 2012-06-14 (×2): 2 [IU] via SUBCUTANEOUS
  Administered 2012-06-14: 1 [IU] via SUBCUTANEOUS
  Administered 2012-06-14 – 2012-06-15 (×3): 2 [IU] via SUBCUTANEOUS
  Administered 2012-06-15 (×2): 3 [IU] via SUBCUTANEOUS
  Administered 2012-06-15 (×2): 2 [IU] via SUBCUTANEOUS
  Administered 2012-06-15: 3 [IU] via SUBCUTANEOUS
  Administered 2012-06-15: 2 [IU] via SUBCUTANEOUS
  Administered 2012-06-16 – 2012-06-17 (×5): 1 [IU] via SUBCUTANEOUS
  Administered 2012-06-18 – 2012-06-19 (×7): 2 [IU] via SUBCUTANEOUS
  Administered 2012-06-19 (×3): 1 [IU] via SUBCUTANEOUS
  Administered 2012-06-19 – 2012-06-20 (×2): 2 [IU] via SUBCUTANEOUS
  Administered 2012-06-20: 3 [IU] via SUBCUTANEOUS
  Administered 2012-06-20 (×2): 2 [IU] via SUBCUTANEOUS
  Administered 2012-06-20 (×2): 3 [IU] via SUBCUTANEOUS
  Administered 2012-06-21: 1 [IU] via SUBCUTANEOUS
  Administered 2012-06-21: 2 [IU] via SUBCUTANEOUS
  Administered 2012-06-21: 3 [IU] via SUBCUTANEOUS
  Administered 2012-06-21: 5 [IU] via SUBCUTANEOUS
  Administered 2012-06-21 (×2): 3 [IU] via SUBCUTANEOUS
  Administered 2012-06-22: 2 [IU] via SUBCUTANEOUS
  Administered 2012-06-22: 3 [IU] via SUBCUTANEOUS
  Administered 2012-06-22: 1 [IU] via SUBCUTANEOUS
  Administered 2012-06-22 (×2): 2 [IU] via SUBCUTANEOUS
  Administered 2012-06-22 – 2012-06-23 (×3): 1 [IU] via SUBCUTANEOUS
  Administered 2012-06-23: 3 [IU] via SUBCUTANEOUS

## 2012-06-12 NOTE — Progress Notes (Signed)
[facility] PROGRESS NOTE  ELADIO DENTREMONT WUJ:[id number] DOB: [birth year] DOA: 05/20/2012 PCP: Jeri Cos, MD  Assessment/Plan: Active Problems:   HTN (hypertension), malignant   CVA (cerebral vascular accident)   Diabetes mellitus   Acute respiratory failure   Aspiration pneumonia   Encephalopathy, metabolic   Dysphagia, unspecified   Altered mental status    1. Seizure disorder: Patient was admitted in status epilepticus. Dr Clydie Braun provided neurology consultation, and patient was commenced on iv Keppra. He has since had breakthrough seizures. On 06/10/12, seizure episode was recorded, which was terminated with 2 mg IV Ativan. Today, patient is hypersomnolent, although responsive to noxious stimuli. He continues to have intermittent jerking movements of left shoulder and arm, observed by this MD. Will repeat EEG. Have reconsulted neurologist. Keppra level was 63 on 06/11/12. And 37.5 today. Keppra is on hold.  2. Acute respiratory failure: Patient was intubated 05/25/2012-06/05/12, for inability to protect airway, due to recurrent seizure activity and AMS. He was successfully extubated on 06/05/12, and currently has O2 Sats at 95%-97% on 2L. Possible aspiration pneumonitis was suspected at the time. Repeat CXR on 06/10/12, did not show any new infiltrates. ntibiotics were discontinued on 06/08/12. Patient remains afebrile.  3.Malignant Hypertension: Patient was on mulitple antihypertensive medications, pre-admission, including Clonidine, beta-blockers, which have been held for status epilepticus. As he is currently NPO, managing with iv Labetolol, Hydralazine and Clonidine patch. Control is reasonable at this time.  4. Dehydratyion/Acute on chronic kidney disease stage 3: Creatinine was 1.50 at presentation, has fluctuated during this hospitalization, but was 1.35 on 06/10/12. Toady, creatinine is 1.67. In addition, patient is hypernatremic, with sodium of 150-152. Probably pre  renal, because of his inability to take po. On iv D5W.  5. Dysphagia: Unclear etiology. His MRI does not show an acute stroke, but showed a large to moderate left hemisphere cva with encephalomalacia. Seen by SLP, but failed MBS because of hiccups. NG tube put in on 2 /22/14, which he pulled out, was reinserted on 06/09/12 and started on jevity at 110ml/hr. On 06/10/12, patient had nausea and vomiting, tube feeds were discontinued and and abdominal film ruled out any obstruction. Now on low intermittent suction. He was started on IV Protonix for positive guaiac of gastric secretions. Will add iv Reglan. NG-T continues to drain bilious liquid.  6. Hypokalemia: Repleting as indicated.    Code Status: Full Code.  Family Communication:  Disposition Plan: To be determined.    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 Intubated for airway protection under PCCM care 05/28/2012, and subsequently extubated on 06/05/12. He was transferred him to [facility] on 2/21. On 06/07/12 he was found to be hypernatremic, in hypertensive urgency and had to be transferred to step down for IV labetalol drip. He was subsequently started on home anti hypertensive's given to him orally with puree as per the SLP recommendations. He failed MBS on 06/09/12 with high risk of aspiration. An NG tube was placed temporarily and feeds started with Jevity. He was was transferred back to the general medical floor on 06/09/12, but continued to decline clinically and on 06/10/12, he had another seizure episode. Brain MRI of 06/10/12 showed no acute infarct and remote moderate to large left hemispheric infarct with encephalomalacia. Repeat CXR did not show any infiltrates and he remained afebrile without any leukocytosis. A palliative care consult was  requested and goals-of-care meeting carried out on 06/11/12. As  per the discussion with palliative care, family members would like some time to come to terms with his prognosis and re meet on 06/13/12. They would like to continue with active treatments at this time.    Consultants:  Dr Clydie Braun, neurologist.   Lorinda Creed, NP, Palliative care medicine.   Procedures:  CXR.  Abdominal X-Ray  Brain MRI.   Antibiotics:  Rocephin 06/06/12-06/08/12.   Vancomycin discontinued on 06/05/12.  Zosyn discontinued on 06/06/12.   HPI/Subjective: Hypersomnolent, has intermittent jerking movements of right shoulder.   Objective: Vital signs in last 24 hours: Temp:  [97.5 F (36.4 C)-99.4 F (37.4 C)] 97.9 F (36.6 C) (02/26 0700) Pulse Rate:  [106-132] 119 (02/26 0800) Resp:  [12-26] 13 (02/26 0800) BP: (86-199)/(56-120) 158/87 mmHg (02/26 0800) SpO2:  [92 %-98 %] 95 % (02/26 0800) Weight:  [89.5 kg (197 lb 5 oz)-89.7 kg (197 lb 12 oz)] 89.5 kg (197 lb 5 oz) (02/26 0500) Weight change:  Last BM Date: 06/07/12  Intake/Output from previous day: 02/25 0701 - 02/26 0700 In: 842.5 [I.V.:757.5; NG/GT:60; IV Piggyback:25] Out: 1350 [Urine:950; Emesis/NG output:400]     Physical Exam: Comfortable, somnolent, responding to noxious stimuli, not short of breath at rest.  HEENT:  Mild clinical pallor, no jaundice, no conjunctival injection or discharge. NECK:  Supple, JVP not seen, no carotid bruits, no palpable lymphadenopathy, no palpable goiter. CHEST:  Clinically clear to auscultation, no wheezes, no crackles. HEART:  Sounds 1 and 2 heard, normal, regular, no murmurs. ABDOMEN:  Full, soft, non-tender, no palpable organomegaly, no palpable masses, normal bowel sounds. GENITALIA:  Not examined. LOWER EXTREMITIES:  No pitting edema, palpable peripheral pulses. MUSCULOSKELETAL SYSTEM:  Generalized osteoarthritic changes, otherwise, normal. CENTRAL NERVOUS SYSTEM:  As above. Unable to formally examine, due to lack of cooperation.   Lab  Results:  Recent Labs  06/11/12 0540 06/12/12 0141  WBC 6.0 4.6  HGB 10.8* 10.2*  HCT 33.1* 31.1*  PLT 232 169    Recent Labs  06/11/12 0540 06/12/12 0141  NA 152* 150*  K 3.8 3.7  CL 118* 119*  CO2 20 21  GLUCOSE 204* 182*  BUN 23 27*  CREATININE 1.43* 1.67*  CALCIUM 8.4 8.2*   Recent Results (from the past 240 hour(s))  URINE CULTURE     Status: None   Collection Time    05/31/2012 12:11 PM      Result Value Range Status   Specimen Description URINE, CATHETERIZED   Final   Special Requests NONE   Final   Culture  Setup Time 05/21/2012 20:14   Final   Colony Count NO GROWTH   Final   Culture NO GROWTH   Final   Report Status 06/03/2012 FINAL   Final  URINE CULTURE     Status: None   Collection Time    06/14/2012  5:45 PM      Result Value Range Status   Specimen Description URINE, CATHETERIZED   Final   Special Requests NONE   Final   Culture  Setup Time 06/03/2012 02:01   Final   Colony Count NO GROWTH   Final   Culture NO GROWTH   Final   Report Status 06/04/2012 FINAL   Final  MRSA PCR SCREENING     Status: None   Collection Time    05/24/2012  6:13 PM      Result Value Range Status   MRSA by PCR NEGATIVE  NEGATIVE Final   Comment:            The GeneXpert MRSA Assay (FDA     approved for NASAL specimens     only), is one component of a     comprehensive MRSA colonization     surveillance program. It is not     intended to diagnose MRSA     infection nor to guide or     monitor treatment for     MRSA infections.  CULTURE, BLOOD (ROUTINE X 2)     Status: None   Collection Time    06/03/12  1:37 AM      Result Value Range Status   Specimen Description BLOOD RIGHT RADIAL ARTERIAL   Final   Special Requests BOTTLES DRAWN AEROBIC AND ANAEROBIC 5CC EA   Final   Culture  Setup Time 06/03/2012 08:25   Final   Culture NO GROWTH 5 DAYS   Final   Report Status 06/09/2012 FINAL   Final  CULTURE, BLOOD (ROUTINE X 2)     Status: None   Collection Time     06/03/12  1:50 AM      Result Value Range Status   Specimen Description BLOOD LEFT ARM   Final   Special Requests BOTTLES DRAWN AEROBIC ONLY 1CC   Final   Culture  Setup Time 06/03/2012 08:25   Final   Culture NO GROWTH 5 DAYS   Final   Report Status 06/09/2012 FINAL   Final  CULTURE, RESPIRATORY (NON-EXPECTORATED)     Status: None   Collection Time    06/03/12  3:51 AM      Result Value Range Status   Specimen Description TRACHEAL ASPIRATE   Final   Special Requests Normal   Final   Gram Stain     Final   Value: ABUNDANT WBC PRESENT, PREDOMINANTLY PMN     RARE SQUAMOUS EPITHELIAL CELLS PRESENT     MODERATE GRAM POSITIVE COCCI IN PAIRS     RARE GRAM POSITIVE RODS   Culture Non-Pathogenic Oropharyngeal-type Flora Isolated.   Final   Report Status 06/05/2012 FINAL   Final  STOOL CULTURE     Status: None   Collection Time    06/04/12  6:47 PM      Result Value Range Status   Specimen Description STOOL   Final   Special Requests NONE   Final   Culture     Final   Value: NO SALMONELLA, SHIGELLA, CAMPYLOBACTER, YERSINIA, OR E.COLI 0157:H7 ISOLATED   Report Status 06/08/2012 FINAL   Final     Studies/Results: Dg Chest 2 View  06/10/2012  *RADIOLOGY REPORT*  Clinical Data: Evaluation of aspiration pneumonia.  CHEST - 2 VIEW  Comparison: Chest x-ray 06/07/2012.  Findings: There has been interval placement of a nasogastric tube which extends in the proximal stomach (side port near the gastroesophageal junction).  Lung volumes are normal.  Left retrocardiac opacity favored to reflect predominately subsegmental atelectasis and small left pleural effusion.  Right lung is clear. Pulmonary venous congestion without frank pulmonary edema.  Mild cardiomegaly.  Mediastinal contours are distorted by patient positioning.  Atherosclerosis in the thoracic aorta.  IMPRESSION: 1.  Interval placement of a nasogastric tube which is slightly high in position and could be advanced approximately 8 cm for more  optimal placement. 2.  Probable left lower lobe subsegmental atelectasis with superimposed small left pleural effusion. 3.  Mild cardiomegaly. 4.  Atherosclerosis.   Original Report Authenticated By: Reuel Boom  Entrikin, M.D.    Mr Brain Wo Contrast  06/10/2012  *RADIOLOGY REPORT*  Clinical Data: Dysphagia.  Prior infarct.  Evaluate for new infarct.  Diabetic hypertensive patient.  MRI HEAD WITHOUT CONTRAST  Technique:  Multiplanar, multiecho pulse sequences of the brain and surrounding structures were obtained according to standard protocol without intravenous contrast.  Comparison: 06/06/2012 head CT.  No comparison brain MR.  Findings: Motion degraded exam.  No acute infarct.  Remote moderate to large left hemispheric infarct with encephalomalacia and Wallerian degeneration.  Marked small vessel disease type changes.  Prominent global atrophy without hydrocephalus.  Part of the left ventricular prominence is related to the prior infarct.  No intracranial hemorrhage.  No intracranial mass lesion detected on this unenhanced exam.  Major intracranial vascular structures are patent at the level of the sella with diseased appearing left middle cerebral artery consistent with the patient's prior infarct.  Mucosal thickening most notable inferior aspect left maxillary sinus with polypoid appearance.  Cervical spondylotic changes incompletely assessed.  IMPRESSION:  Motion degraded exam.  No acute infarct.  Remote moderate to large left hemispheric infarct with encephalomalacia and Wallerian degeneration.  Marked small vessel disease type changes.  Prominent global atrophy without hydrocephalus.  Part of the left ventricular prominence is related to the prior infarct.  Major intracranial vascular structures are patent at the level of the sella with diseased appearing left middle cerebral artery consistent with the patient's prior infarct.  Mucosal thickening most notable inferior aspect left maxillary sinus with polypoid  appearance.   Original Report Authenticated By: Lacy Duverney, M.D.     Medications: Scheduled Meds: . antiseptic oral rinse  1 application Mouth Rinse QID  . chlorhexidine  15 mL Mouth/Throat BID  . cloNIDine  0.3 mg Transdermal Weekly  . hydrALAZINE  50 mg Oral Q8H  . insulin aspart  0-9 Units Subcutaneous Q4H  . metoprolol tartrate  100 mg Oral BID  . pantoprazole (PROTONIX) IV  40 mg Intravenous Q0600   Continuous Infusions: . dextrose 50 mL/hr at 06/12/12 0647   PRN Meds:.acetaminophen (TYLENOL) oral liquid 160 mg/5 mL, chlorproMAZINE (THORAZINE) IV, hydrALAZINE, labetalol, LORazepam, ondansetron    LOS: 10 days   Javaughn Opdahl,CHRISTOPHER  [facility] Pager [phone]. If 8PM-8AM, please contact night-coverage at [email or web address], password [facility] 06/12/2012, 11:03 AM  LOS: 10 days

## 2012-06-12 NOTE — Progress Notes (Signed)
Seizure-like tremors and unresponsiveness noted/witnessed for a period of 5 minutes by pt's son and myself. K Schorr paged and notified. No new orders @ this time. Lab work still pending. Will continue to monitor and assess.

## 2012-06-12 NOTE — Progress Notes (Signed)
Patient transferred to unit 3300 and to Robert Mcclure, [place], for further assessment and d/c planning-Robert Mcclure, MSW, [employer] [phone]

## 2012-06-12 NOTE — Progress Notes (Signed)
Notified K Schorr of [facility] regarding pt's lab work (no acute change from previous lab work) and observance of additional seizure like activity. Due to pt's current stable condition (BPs regulating @ 120s-130s SBP), no new orders. Will continue to monitor and assess for acute changes. Pt son at bedside and made aware of current plan.

## 2012-06-12 NOTE — Progress Notes (Signed)
Pt has transferred to 3300. This CSW to begin following and assisting with dc plans. CSW observed that palliative care will meet with family again on Thursday to clarify GOC. CSW to assess and make necessary referrals once GOC clarified.  Theresia Bough, MSW, Theresia Majors [phone]

## 2012-06-12 NOTE — Progress Notes (Signed)
Physical Therapy Treatment Patient Details Name: Robert Mcclure MRN: [id number] DOB: [birth year] Today's Date: 06/12/2012 Time: 6295-2841 PT Time Calculation (min): 23 min  PT Assessment / Plan / Recommendation Comments on Treatment Session  Patient able to participate in 3 way co-treat today for eval of swallow function, for orientation, and for sitting balance, tolerance to upright activity.  Feel his interactions improved during sitting today as well as time for response to swallow with speech therapy.  However, function greatly changed from initial evaluation with increased assist needed for all mobility and unable to tolerate out of bed due to increased HR.   Feel would benefit from SNF level therapies at d/c.    Follow Up Recommendations  SNF           Equipment Recommendations  None recommended by PT       Frequency Min 3X/week   Plan Discharge plan needs to be updated    Precautions / Restrictions Precautions Precautions: Fall Precaution Comments: right hemiplegia Restrictions Weight Bearing Restrictions: No   Pertinent Vitals/Pain No complaints of pain HR max 141; sustained at 139 in sitting; BP after session 149/94    Mobility  Bed Mobility Bed Mobility: Sit to Supine;Scooting to HOB Supine to Sit: 1: +2 Total assist;With rails;HOB elevated Supine to Sit: Patient Percentage: 20% Sitting - Scoot to Edge of Bed: 1: +2 Total assist Sitting - Scoot to Edge of Bed: Patient Percentage: 0% Sit to Supine: 1: +2 Total assist Sit to Supine: Patient Percentage: 10% Scooting to HOB: 1: +2 Total assist Scooting to [facility]: Patient Percentage: 0% Details for Bed Mobility Assistance: Pt need max v/c and tactile hand over hand to reach for bed rail. Pt needs cues to sequence task      PT Goals Acute Rehab PT Goals Pt will go Supine/Side to Sit: with mod assist PT Goal: Supine/Side to Sit - Progress: Revised due to lack of progress Pt will go Sit to Supine/Side: with mod  assist PT Goal: Sit to Supine/Side - Progress: Revised due to lack of progress Pt will go Sit to Stand: with +2 total assist PT Goal: Sit to Stand - Progress: Revised due to lack of progress Pt will go Stand to Sit: with +2 total assist PT Goal: Stand to Sit - Progress: Revised due to lack of progress Pt will Ambulate: 1 - 15 feet;with +2 total assist PT Goal: Ambulate - Progress: Revised due to lack of progress  Visit Information  Last PT Received On: 06/12/12 Assistance Needed: +2 PT/OT Co-Evaluation/Treatment: Yes    Subjective Data  Subjective: "I don't know"   Cognition  Cognition Overall Cognitive Status: Impaired Area of Impairment: Attention;Memory;Following commands Arousal/Alertness: Awake/alert Orientation Level: Situation;Time;Place;Person;Disoriented to Behavior During Session: Flat affect Current Attention Level: Focused Attention - Other Comments: needed constant cues to attend to food bolus as speech was working with him Memory Deficits: did not recognize his son, needed cues to remember what we were asking if there was a delay in his response Following Commands: Follows one step commands with increased time Safety/Judgement: Decreased awareness of need for assistance    Balance  Static Sitting Balance Static Sitting - Balance Support: Feet supported;Left upper extremity supported Static Sitting - Level of Assistance: 3: Mod assist;4: Min assist Static Sitting - Comment/# of Minutes: sate edge of bed 15 minutes while speech assisting with BSE and OT working to orient patient; required varying level of assist for other functional tasks  End of Session PT - End  of Session Activity Tolerance: Treatment limited secondary to medical complications (Comment) (HR up to 141 sitting edge of bed) Patient left: in bed;with family/visitor present   GP     [facility] 06/12/2012, 12:28 PM Sheran Lawless, PT [phone] 06/12/2012

## 2012-06-12 NOTE — Progress Notes (Signed)
Portable EEG completed

## 2012-06-12 NOTE — Progress Notes (Addendum)
NEURO HOSPITALIST PROGRESS NOTE   SUBJECTIVE:                                                                                                                        Called by patient's MD regarding concerns for persistent seizures. His son tells me that since yesterday [name] has been experiencing " twitching movements upper and lower body every couple of minutes". Keppra on hold due to supra therapeutic levels (better today at 37.5 ug/ml). Except for above concerns, no other neurologic developments.  OBJECTIVE:                                                                                                                           Vital signs in last 24 hours: Temp:  [97.5 F (36.4 C)-99.4 F (37.4 C)] 98.5 F (36.9 C) (02/26 1300) Pulse Rate:  [106-141] 141 (02/26 1100) Resp:  [12-26] 13 (02/26 0800) BP: (86-199)/(56-120) 158/87 mmHg (02/26 0800) SpO2:  [92 %-98 %] 95 % (02/26 0800) Weight:  [89.5 kg (197 lb 5 oz)-89.7 kg (197 lb 12 oz)] 89.5 kg (197 lb 5 oz) (02/26 0500)  Intake/Output from previous day: 02/25 0701 - 02/26 0700 In: 842.5 [I.V.:757.5; NG/GT:60; IV Piggyback:25] Out: 1350 [Urine:950; Emesis/NG output:400] Intake/Output this shift: Total I/O In: -  Out: 15 [Urine:15] Nutritional status: NPO  Past Medical History  Diagnosis Date  . Hypertension   . Diabetes mellitus   . CVA (cerebral vascular accident)   . Stroke     Neurologic ROS negative with exception of above.   Neurologic Exam:  Mental status: lethargic but easy to arouse. Doesn't follow commands. CN 2-12: pupils 4 mm bilaterally, reactive to light. No gaze preference. Blinks to threat. EOM full. No nystagmus. Right face weakness. Tongue midline. Motor: moves all limbs spontaneously, left greater than right. Sensory: withdraws to pain. DTR's: 2+ all over. Plantars: bilateral upgoing toes. Coordination and gait: no tested. Lab Results: Lab Results   Component Value Date/Time   CHOL  Value: 212        ATP III CLASSIFICATION:  <200     mg/dL   Desirable  308-657  mg/dL   Borderline High  >=846    mg/dL   High       *  06/09/2010  6:45 AM   Lipid Panel No results found for this basename: CHOL, TRIG, HDL, CHOLHDL, VLDL, LDLCALC,  in the last 72 hours  Studies/Results: Mr Brain Wo Contrast  06/10/2012  *RADIOLOGY REPORT*  Clinical Data: Dysphagia.  Prior infarct.  Evaluate for new infarct.  Diabetic hypertensive patient.  MRI HEAD WITHOUT CONTRAST  Technique:  Multiplanar, multiecho pulse sequences of the brain and surrounding structures were obtained according to standard protocol without intravenous contrast.  Comparison: 06/11/2012 head CT.  No comparison brain MR.  Findings: Motion degraded exam.  No acute infarct.  Remote moderate to large left hemispheric infarct with encephalomalacia and Wallerian degeneration.  Marked small vessel disease type changes.  Prominent global atrophy without hydrocephalus.  Part of the left ventricular prominence is related to the prior infarct.  No intracranial hemorrhage.  No intracranial mass lesion detected on this unenhanced exam.  Major intracranial vascular structures are patent at the level of the sella with diseased appearing left middle cerebral artery consistent with the patient's prior infarct.  Mucosal thickening most notable inferior aspect left maxillary sinus with polypoid appearance.  Cervical spondylotic changes incompletely assessed.  IMPRESSION:  Motion degraded exam.  No acute infarct.  Remote moderate to large left hemispheric infarct with encephalomalacia and Wallerian degeneration.  Marked small vessel disease type changes.  Prominent global atrophy without hydrocephalus.  Part of the left ventricular prominence is related to the prior infarct.  Major intracranial vascular structures are patent at the level of the sella with diseased appearing left middle cerebral artery consistent with the  patient's prior infarct.  Mucosal thickening most notable inferior aspect left maxillary sinus with polypoid appearance.   Original Report Authenticated By: Lacy Duverney, M.D.    Dg Chest Port 1 View  06/12/2012  *RADIOLOGY REPORT*  Clinical Data: Possible aspiration.  PORTABLE CHEST - 1 VIEW  Comparison: Chest 06/10/2012.  Findings: The patient is rotated on the study.  There is a tiny left pleural effusion and mild basilar airspace disease, likely atelectasis.  Right lung is clear.  There is cardiomegaly but no edema.  NG tube courses into the stomach and below the inferior margin of the film.  IMPRESSION:  1.  Improved left basilar atelectasis with a tiny effusion noted. No new abnormality. 2.  Cardiomegaly.   Original Report Authenticated By: Holley Dexter, M.D.     MEDICATIONS                                                                                                                       I have reviewed the patient's current medications.  ASSESSMENT/PLAN:  67 years old with symptomatic post stroke seizures, recently admitted to the hospital with GTC SE that resolved after receiving loading dose fosphenytoin, IV midazolam, propofol and intubation for airway protection. He has been described as having intermittent jerkiness that seem to be generalized. Will suggest getting prolonged EEG now. Will give loading dose fosphenytoin 1 gram IV and check dilantin level daily. Load with IOV vimpat 200 mg now, and 100 mg IV starting tomorrow. Resume keppra at a lower dose 500 mg IV BID when levetiracetam level returns to normal. Can use ativan 2 mg IV as needed, no to exceed 8 mg daily. Will follow up.  [name], MD [facility] [phone]  06/12/2012, 1:39 PM

## 2012-06-12 NOTE — Progress Notes (Signed)
Speech Language Pathology Dysphagia Treatment Patient Details Name: Robert Mcclure MRN: [id number] DOB: [birth year] Today's Date: 06/12/2012 Time: 4098-1191 SLP Time Calculation (min): 13 min  Assessment / Plan / Recommendation Clinical Impression  Pt demonstrating improved function today, seen with PT/OT who sat pt on edge of bed. Arousal much improved though max verbal and tactile cues, postioning were required to facilitate oral transit and swallow resposne. Pt did not evidence any decreased airway protection. Also hiccoughs previously impacting function have stopped. Pt does however have an NG tube for suction which is removing red/brown material. Though function is improving, pt is not ready for diet until GI issues have resolved. Discussed with MD. SLP will continue to follow closely.     Diet Recommendation  Continue with Current Diet: NPO    SLP Plan Continue with current plan of care   Pertinent Vitals/Pain NA   Swallowing Goals  SLP Swallowing Goals Goal #3: Pt will participate in PO trials with sufficient attention to bolus 80% of trials. Swallow Study Goal #3 - Progress: Progressing toward goal  General Temperature Spikes Noted: No Respiratory Status: Supplemental O2 delivered via (comment) Behavior/Cognition: Alert;Confused;Requires cueing;Decreased sustained attention Oral Cavity - Dentition: Edentulous Patient Positioning:  (edge of bed with PT)  Oral Cavity - Oral Hygiene Patient is HIGH RISK - Oral Care Protocol followed (see row info): Yes   Dysphagia Treatment Treatment focused on: Upgraded PO texture trials;Facilitation of pharyngeal phase;Facilitation of oral phase Treatment Methods/Modalities: Skilled observation;Differential diagnosis Patient observed directly with PO's: Yes Type of PO's observed: Thin liquids;Ice chips;Dysphagia 1 (puree) Feeding: Total assist Liquids provided via: Teaspoon Oral Phase Signs & Symptoms: Prolonged oral phase;Prolonged  bolus formation Pharyngeal Phase Signs & Symptoms: Suspected delayed swallow initiation Type of cueing: Verbal;Tactile Amount of cueing: Maximal   GO    Robert Ditty, MA CCC-SLP [phone] Robert Mcclure 06/12/2012, 11:37 AM

## 2012-06-12 NOTE — Progress Notes (Signed)
Pt BPs coming up (SBP: 100s-130s) but still minimally responsive. K Schorr aware.

## 2012-06-12 NOTE — Evaluation (Signed)
Occupational Therapy Evaluation Patient Details Name: REDA GETTIS MRN: [id number] DOB: 1946-01-04 Today's Date: 06/12/2012 Time: 6213-0865 OT Time Calculation (min): 23 min  OT Assessment / Plan / Recommendation Clinical Impression  67 yo male s/p seizures and acute respiratory failure that could benefit from skilled OT acutely. Pt with rapid response called 06/11/12 for decr responsiveness, HR decr to 100s and SBP 180s pt transferred to stepdown. Recommend SNF for d/c planning    OT Assessment  Patient needs continued OT Services    Follow Up Recommendations  SNF    Barriers to Discharge      Equipment Recommendations  Wheelchair (measurements OT);Wheelchair cushion (measurements OT)    Recommendations for Other Services    Frequency  Min 2X/week    Precautions / Restrictions Precautions Precautions: Fall Precaution Comments: right hemiplegia Restrictions Weight Bearing Restrictions: No   Pertinent Vitals/Pain HR sustained Vtach 139  Highest HR 141 during session Pt at rest 120 HR    ADL  Eating/Feeding: NPO (NG tube) Transfers/Ambulation Related to ADLs: Pt total (A) for bed mobility and (A) for eob sitting ADL Comments: Pt aroused to name call and tactile input. Pt able to maintain arousal at EOB. Pt with brief moments of lethargic state needing stimulation to continue to progress in session. SLP present attempting swallowing total (A) for self feeding. pt with delayed response to all commands    OT Diagnosis: Generalized weakness;Cognitive deficits;Paresis  OT Problem List: Decreased strength;Decreased activity tolerance;Impaired balance (sitting and/or standing);Decreased safety awareness;Decreased knowledge of use of DME or AE;Decreased knowledge of precautions;Cardiopulmonary status limiting activity OT Treatment Interventions: Self-care/ADL training;Neuromuscular education;DME and/or AE instruction;Therapeutic activities;Cognitive  remediation/compensation;Patient/family education;Balance training   OT Goals Acute Rehab OT Goals OT Goal Formulation: Patient unable to participate in goal setting Time For Goal Achievement: 06/26/12 Potential to Achieve Goals: Good ADL Goals Pt Will Perform Grooming: with min assist;Sitting, chair;Supported ADL Goal: Grooming - Progress: Goal set today Miscellaneous OT Goals Miscellaneous OT Goal #1: Pt will tolerate EOB sitting min guard (A) for 15 minutes as precursor to adls OT Goal: Miscellaneous Goal #1 - Progress: Goal set today Miscellaneous OT Goal #2: Pt will follow one step command 4 out 5 trials during session OT Goal: Miscellaneous Goal #2 - Progress: Goal set today  Visit Information  Last OT Received On: 06/12/12 Assistance Needed: +2 PT/OT Co-Evaluation/Treatment: Yes    Subjective Data  Subjective: "some water"- pt requesting water Patient Stated Goal: to have some water   Prior Functioning     Home Living Lives With: Alone Available Help at Discharge: Personal care attendant;Family;Available PRN/intermittently Type of Home: Apartment Home Access: Level entry;Elevator Home Layout: One level Bathroom Shower/Tub: Engineer, manufacturing systems: Standard Bathroom Accessibility: Yes How Accessible: Accessible via walker Home Adaptive Equipment: Shower chair with back Prior Function Level of Independence: Independent with assistive device(s);Needs assistance Needs Assistance: Bathing;Meal Prep;Light Housekeeping Bath: Moderate Meal Prep: Maximal Light Housekeeping: Total Driving: No Vocation: Retired Musician: Expressive difficulties Dominant Hand: Right         Vision/Perception Vision - History Patient Visual Report: Other (comment) Vision - Assessment Vision Assessment: Vision tested Ocular Range of Motion: Within Functional Limits Additional Comments: Pt tracking therapist aroudn the room and looking for family with  max v/c    Cognition  Cognition Overall Cognitive Status: Impaired Area of Impairment: Attention;Memory;Following commands Arousal/Alertness: Awake/alert Orientation Level: Situation;Time;Place;Person;Disoriented to Behavior During Session: Flat affect Current Attention Level: Focused Attention - Other Comments: needed constant cues to attend to  food bolus as speech was working with him Memory Deficits: did not recognize his son, needed cues to remember what we were asking if there was a delay in his response Following Commands: Follows one step commands with increased time Safety/Judgement: Decreased awareness of need for assistance    Extremity/Trunk Assessment Right Upper Extremity Assessment RUE ROM/Strength/Tone: Deficits RUE ROM/Strength/Tone Deficits: Brunstrom III , MMT 3- out 5, demonstrates AROM elbow flexion/ extension, grasp. AAROM for shoulder flexion RUE Coordination: Deficits Left Upper Extremity Assessment LUE ROM/Strength/Tone: Deficits;Due to impaired cognition LUE ROM/Strength/Tone Deficits: Pt demonstrates Gross shoulder flexion 3- out 5, elbow flexion/extension 3 out 5, grasp 3 out 5 LUE Coordination: Deficits     Mobility Bed Mobility Bed Mobility: Sit to Supine;Scooting to HOB Supine to Sit: 1: +2 Total assist;With rails;HOB elevated Supine to Sit: Patient Percentage: 20% Sitting - Scoot to Edge of Bed: 1: +2 Total assist Sitting - Scoot to Edge of Bed: Patient Percentage: 0% Sit to Supine: 1: +2 Total assist Sit to Supine: Patient Percentage: 10% Scooting to HOB: 1: +2 Total assist Scooting to [facility]: Patient Percentage: 0% Details for Bed Mobility Assistance: Pt need max v/c and tactile hand over hand to reach for bed rail. Pt needs cues to sequence task Transfers Transfers: Not assessed     Exercise     Balance Static Sitting Balance Static Sitting - Balance Support: Feet supported;Left upper extremity supported Static Sitting - Level of Assistance:  3: Mod assist;4: Min assist Static Sitting - Comment/# of Minutes: sate edge of bed 15 minutes while speech assisting with BSE and OT working to orient patient; required varying level of assist for other functional tasks   End of Session OT - End of Session Activity Tolerance: Patient tolerated treatment well Patient left: in bed;with call bell/phone within reach;with family/visitor present Nurse Communication: Mobility status;Precautions  GO     Lucile Shutters 06/12/2012, 12:28 PM Pager: [phone]

## 2012-06-12 NOTE — Progress Notes (Signed)
K Schorr of [facility] paged and notified of pt's new hypotension (SBP 80s-90s) where as previously hypertensive (SBP 160s-180s), minimal responsiveness, and decreased HR (100s-110s where as previously 120s-140s). Rapid Response RN Lowanda Foster) also aware and assessed pt at bedside. Evidence of seizure noted. STAT labs ordered. Will continue to monitor and assess.

## 2012-06-13 DIAGNOSIS — R531 Weakness: Secondary | ICD-10-CM

## 2012-06-13 DIAGNOSIS — R609 Edema, unspecified: Secondary | ICD-10-CM

## 2012-06-13 LAB — COMPREHENSIVE METABOLIC PANEL
AST: 24 U/L (ref 0–37)
BUN: 34 mg/dL — ABNORMAL HIGH (ref 6–23)
CO2: 24 mEq/L (ref 19–32)
Calcium: 8.5 mg/dL (ref 8.4–10.5)
Creatinine, Ser: 2.07 mg/dL — ABNORMAL HIGH (ref 0.50–1.35)
GFR calc Af Amer: 36 mL/min — ABNORMAL LOW (ref >90)
GFR calc non Af Amer: 31 mL/min — ABNORMAL LOW (ref >90)
Glucose, Bld: 169 mg/dL — ABNORMAL HIGH (ref 70–99)

## 2012-06-13 LAB — GLUCOSE, CAPILLARY
Glucose-Capillary: 160 mg/dL — ABNORMAL HIGH (ref 70–99)
Glucose-Capillary: 164 mg/dL — ABNORMAL HIGH (ref 70–99)
Glucose-Capillary: 158 mg/dL — ABNORMAL HIGH (ref 70–99)

## 2012-06-13 LAB — CBC
MCH: 31.4 pg (ref 26.0–34.0)
MCV: 95.4 fL (ref 78.0–100.0)
Platelets: 207 10*3/uL (ref 150–400)
RBC: 3.03 MIL/uL — ABNORMAL LOW (ref 4.22–5.81)

## 2012-06-13 LAB — PHENYTOIN LEVEL, TOTAL: Phenytoin Lvl: 16.2 ug/mL (ref 10.0–20.0)

## 2012-06-13 MED ORDER — POTASSIUM CHLORIDE 10 MEQ/100ML IV SOLN
10.0000 meq | INTRAVENOUS | Status: AC
  Administered 2012-06-13 (×4): 10 meq via INTRAVENOUS
  Filled 2012-06-13: qty 400

## 2012-06-13 MED ORDER — METOPROLOL TARTRATE 1 MG/ML IV SOLN
INTRAVENOUS | Status: AC
  Filled 2012-06-13: qty 5

## 2012-06-13 MED ORDER — METOPROLOL TARTRATE 1 MG/ML IV SOLN
5.0000 mg | Freq: Four times a day (QID) | INTRAVENOUS | Status: DC
  Administered 2012-06-13 – 2012-06-19 (×20): 5 mg via INTRAVENOUS
  Filled 2012-06-13 (×27): qty 5

## 2012-06-13 MED ORDER — HYDRALAZINE HCL 20 MG/ML IJ SOLN
10.0000 mg | Freq: Four times a day (QID) | INTRAMUSCULAR | Status: DC
  Administered 2012-06-13 – 2012-06-15 (×7): 10 mg via INTRAVENOUS
  Filled 2012-06-13: qty 1
  Filled 2012-06-13: qty 0.5
  Filled 2012-06-13: qty 1
  Filled 2012-06-13 (×6): qty 0.5
  Filled 2012-06-13: qty 1
  Filled 2012-06-13: qty 0.5

## 2012-06-13 MED ORDER — SODIUM CHLORIDE 0.9 % IV SOLN
12.5000 mg | Freq: Four times a day (QID) | INTRAVENOUS | Status: DC
  Administered 2012-06-13 – 2012-06-14 (×3): 12.5 mg via INTRAVENOUS
  Filled 2012-06-13 (×8): qty 0.5

## 2012-06-13 NOTE — Progress Notes (Signed)
NEURO HOSPITALIST PROGRESS NOTE   SUBJECTIVE:                                                                                                                        Doing better today. Family and nursing staff report no further seizure like movements today. More alert today. On dilantin 100 mg IV TID and vimpat 100 mg IV BID. Keppra on hold. No other neurological developments. EEG showed no electrographic seizures.   OBJECTIVE:                                                                                                                           Vital signs in last 24 hours: Temp:  [98.4 F (36.9 C)-99.1 F (37.3 C)] 98.6 F (37 C) (02/27 1200) Pulse Rate:  [102-110] 110 (02/27 0400) Resp:  [13-18] 18 (02/27 1200) BP: (149-187)/(79-103) 165/79 mmHg (02/27 1200) SpO2:  [94 %-99 %] 99 % (02/27 0400) Weight:  [87.9 kg (193 lb 12.6 oz)] 87.9 kg (193 lb 12.6 oz) (02/27 0500)  Intake/Output from previous day: 02/26 0701 - 02/27 0700 In: 855 [I.V.:650; NG/GT:30; IV Piggyback:175] Out: 895 [Urine:195; Emesis/NG output:700] Intake/Output this shift: Total I/O In: 50 [I.V.:50] Out: 250 [Urine:250] Nutritional status: NPO  Past Medical History  Diagnosis Date  . Hypertension   . Diabetes mellitus   . CVA (cerebral vascular accident)   . Stroke     Neurologic ROS negative with exception of above.   Neurologic Exam:  Mental status: lethargic but easy to arouse.Follows simple commands.  CN 2-12: pupils 4 mm bilaterally, reactive to light. No gaze preference. Blinks to threat. EOM full. No nystagmus. Right face weakness. Tongue midline.  Motor: moves all limbs spontaneously, left greater than right.  Sensory: withdraws to pain.  DTR's: 2+ all over.  Plantars: bilateral upgoing toes.  Coordination and gait: no tested.   Lab Results: Lab Results  Component Value Date/Time   CHOL  Value: 212        ATP III CLASSIFICATION:  <200     mg/dL    Desirable  161-096  mg/dL   Borderline High  >=045    mg/dL   High       * 07/24/8117  6:45  AM   Lipid Panel No results found for this basename: CHOL, TRIG, HDL, CHOLHDL, VLDL, LDLCALC,  in the last 72 hours  Studies/Results: Dg Chest Port 1 View  06/12/2012  *RADIOLOGY REPORT*  Clinical Data: Possible aspiration.  PORTABLE CHEST - 1 VIEW  Comparison: Chest 06/10/2012.  Findings: The patient is rotated on the study.  There is a tiny left pleural effusion and mild basilar airspace disease, likely atelectasis.  Right lung is clear.  There is cardiomegaly but no edema.  NG tube courses into the stomach and below the inferior margin of the film.  IMPRESSION:  1.  Improved left basilar atelectasis with a tiny effusion noted. No new abnormality. 2.  Cardiomegaly.   Original Report Authenticated By: Holley Dexter, M.D.     MEDICATIONS                                                                                                                       I have reviewed the patient's current medications.  ASSESSMENT/PLAN:                                                                                                           Neurologically improved. Continue current anticonvulsant regimen.  [employer]. [facility] [phone]  06/13/2012, 1:11 PM

## 2012-06-13 NOTE — Progress Notes (Signed)
SLP Cancellation Note  Patient Details Name: Robert Mcclure MRN: [id number] DOB: 11-Mar-1946   Cancelled treatment:       Reason Eval/Treat Not Completed: Patient not medically ready. Await removal of NG for suction of gastric contents for further swallow assessment. Will continue to follow via chart for plan of care and readiness. [name], [place] CCC-SLP [phone]  Claudine Mouton 06/13/2012, 7:45 AM

## 2012-06-13 NOTE — Progress Notes (Signed)
Progress Note from the Palliative Medicine Team at [facility]  Subjective: patient awake and oriented to person and place, able to follow commands, sister Merian Capron at bedside  --re goal with patient's six siblings--   Objective: No Known Allergies Scheduled Meds: . antiseptic oral rinse  1 application Mouth Rinse QID  . chlorhexidine  15 mL Mouth/Throat BID  . cloNIDine  0.3 mg Transdermal Weekly  . hydrALAZINE  50 mg Oral Q8H  . insulin aspart  0-9 Units Subcutaneous Q4H  . lacosamide (VIMPAT) IV  100 mg Intravenous Q12H  . metoCLOPramide (REGLAN) injection  5 mg Intravenous Q6H  . metoprolol tartrate  100 mg Oral BID  . pantoprazole (PROTONIX) IV  40 mg Intravenous Q0600  . phenytoin (DILANTIN) IV  100 mg Intravenous TID   Continuous Infusions: . dextrose 50 mL/hr at 06/13/12 0453   PRN Meds:.acetaminophen (TYLENOL) oral liquid 160 mg/5 mL, chlorproMAZINE (THORAZINE) IV, hydrALAZINE, labetalol, LORazepam, ondansetron  BP 175/91  Pulse 110  Temp(Src) 98.6 F (37 C) (Oral)  Resp 18  Ht 6' (1.829 m)  Wt 87.9 kg (193 lb 12.6 oz)  BMI 26.28 kg/m2  SpO2 99%   PPS:30 % at best  Pain Score:denies   Intake/Output Summary (Last 24 hours) at 06/13/12 0905 Last data filed at 06/13/12 0845  Gross per 24 hour  Intake    905 ml  Output   1045 ml  Net   -140 ml       Physical Exam:  General: chronically ill appearing, more alert today  HEENT: Moist muvcou membranes, NG to wall suct  Chest: Scattered coarse BS  CVS: RRR  Abdomen: soft NT +BS  Ext: without edema  Neuro: eyes open, tracking, able to follow commands, and verbalizes yes and no answers     Labs: CBC    Component Value Date/Time   WBC 5.5 06/13/2012 0500   RBC 3.03* 06/13/2012 0500   HGB 9.5* 06/13/2012 0500   HCT 28.9* 06/13/2012 0500   PLT 207 06/13/2012 0500   MCV 95.4 06/13/2012 0500   MCH 31.4 06/13/2012 0500   MCHC 32.9 06/13/2012 0500   RDW 15.0 06/13/2012 0500   LYMPHSABS 4.1* 06/11/2012 1152   MONOABS 0.7 05/21/2012 1152   EOSABS 0.1 05/21/2012 1152   BASOSABS 0.1 05/30/2012 1152    BMET    Component Value Date/Time   NA 148* 06/13/2012 0500   K 3.4* 06/13/2012 0500   CL 113* 06/13/2012 0500   CO2 24 06/13/2012 0500   GLUCOSE 169* 06/13/2012 0500   BUN 34* 06/13/2012 0500   CREATININE 2.07* 06/13/2012 0500   CALCIUM 8.5 06/13/2012 0500   GFRNONAA 31* 06/13/2012 0500   GFRAA 36* 06/13/2012 0500    CMP     Component Value Date/Time   NA 148* 06/13/2012 0500   K 3.4* 06/13/2012 0500   CL 113* 06/13/2012 0500   CO2 24 06/13/2012 0500   GLUCOSE 169* 06/13/2012 0500   BUN 34* 06/13/2012 0500   CREATININE 2.07* 06/13/2012 0500   CALCIUM 8.5 06/13/2012 0500   PROT 5.7* 06/13/2012 0500   ALBUMIN 2.4* 06/13/2012 0500   AST 24 06/13/2012 0500   ALT 30 06/13/2012 0500   ALKPHOS 66 06/13/2012 0500   BILITOT 0.4 06/13/2012 0500   GFRNONAA 31* 06/13/2012 0500   GFRAA 36* 06/13/2012 0500     Assessment and Plan: 1. Code Status: Full Code--Focus of care is provide all available interventions to prolong life, hopeful for improvement and return  [place] 2. Symptom Control:            Dysphagia: progress with speech therapy recommendations 3. Psycho/Social: Emotional support offered to family, all are aware of tenuous situation but remain hopeful for improvement 4. Spiritual Chaplain consulted 5. Disposition: Dependant on outcomes, family is anticipating SNF for rehabilition once stable  Patient Documents Completed or Given: Document Given Completed  Advanced Directives Pkt    MOST    DNR    Gone from My Sight    Hard Choices  yes    Time In Time Out Total Time Spent with Patient Total Overall Time  1100 1140 35 min 40 min    Greater than 50%  of this time was spent counseling and coordinating care related to the above assessment and plan.  Lorinda Creed NP  Palliative Medicine Team Team Phone # [phone] Pager [phone]  Discussed with DR Oti 1

## 2012-06-13 NOTE — Progress Notes (Signed)
Pt was asleep when I arrived. His siblings were bedside and said they were ok. Marjory Lies Chaplain

## 2012-06-13 NOTE — Progress Notes (Addendum)
Clinical Social Work Department CLINICAL SOCIAL WORK PLACEMENT NOTE 06/13/2012  Patient:  Robert Mcclure, Robert Mcclure  Account Number:  [id number] Admit date:  Jul 01, 2012  Clinical Social Worker:  Theresia Bough, Theresia Majors  Date/time:  06/13/2012 04:13 PM  Clinical Social Work is seeking post-discharge placement for this patient at the following level of care:   SKILLED NURSING   (*CSW will update this form in Epic as items are completed)   06/13/2012  Patient/family provided with [facility] Department of Clinical Social Work's list of facilities offering this level of care within the geographic area requested by the patient (or if unable, by the patient's family).  06/13/2012  Patient/family informed of their freedom to choose among providers that offer the needed level of care, that participate in Medicare, Medicaid or managed care program needed by the patient, have an available bed and are willing to accept the patient.  06/13/2012  Patient/family informed of [facility]' ownership interest in [facility], as well as of the fact that they are under no obligation to receive care at this facility.  PASARR submitted to EDS on  PASARR number received from EDS on EXISTING #  FL2 transmitted to all facilities in geographic area requested by pt/family on  06/13/2012 FL2 transmitted to all facilities within larger geographic area on   Patient informed that his/her managed care company has contracts with or will negotiate with  certain facilities, including the following:     Patient/family informed of bed offers received:   Patient chooses bed at  Physician recommends and patient chooses bed at    Patient to be transferred to  on   Patient to be transferred to facility by   The following physician request were entered in Epic:   Additional Comments:  Theresia Bough, MSW, [employer] [phone]

## 2012-06-13 NOTE — Progress Notes (Signed)
Clinical Social Worker (CSW) aware of palliative care meeting this morning. CSW contact NP Lorinda Creed who informed CSW that family has decided to continue aggressive care and potentially interested in SNF placement at dc. CSW contacted pt daughter to introduced herself and provide options for SNF placement. Daughter requested for CSW to call later this afternoon.   Full assessment to follow.  Theresia Bough, MSW, Theresia Majors [phone]

## 2012-06-13 NOTE — Progress Notes (Addendum)
[facility] PROGRESS NOTE  Robert Mcclure XBJ:[id number] DOB: Jul 27, 1945 DOA: 06/01/2012 PCP: Jeri Cos, MD  Assessment/Plan: Active Problems:   HTN (hypertension), malignant   CVA (cerebral vascular accident)   Diabetes mellitus   Acute respiratory failure   Aspiration pneumonia   Encephalopathy, metabolic   Dysphagia, unspecified   Altered mental status    1. Seizure disorder: Patient was admitted in status epilepticus. Dr Clydie Braun provided neurology consultation, and patient was commenced on iv Keppra. He has since had breakthrough seizures. On 06/10/12, seizure episode was recorded, which was terminated with 2 mg IV Ativan. As of 06/12/12, patient was hypersomnolent, although responsive to noxious stimuli and continued to have intermittent jerking movements of left shoulder and arm, observed by this MD. Dr Leroy Kennedy was re-consulted, and he has modified patient's anti-convulsant medication, with good effect. Repeat EEG on 06/13/12, was devoid of seizure activity. Keppra level was 63 on 06/11/12. And 37.5 on 06/12/12. Will recheck levels. Keppra is currently on hold. Today, patient is alert and interactive.  2. Acute respiratory failure: Patient was intubated 05/21/2012-06/05/12, for inability to protect airway, due to recurrent seizure activity and AMS. He was successfully extubated on 06/05/12, and currently has O2 Sats at 95%-97% on 2L. Possible aspiration pneumonitis was suspected at the time. Repeat CXR on 06/10/12, did not show any new infiltrates. Antibiotics were discontinued on 06/08/12. Patient remains afebrile. PCXR of 06/12/12 showed improved left basilar atelectasis with a tiny effusion noted. No new abnormality. 3. Malignant Hypertension: Patient was on mulitple antihypertensive medications, pre-admission, including Clonidine, beta-blockers, which have been held for status epilepticus. As he is currently NPO, managing with iv Labetolol, Hydralazine and Clonidine patch.  Control is sub-optimal. Will adjust medications further, today. 4. Dehydratyion/Acute on chronic kidney disease stage 3: Creatinine was 1.50 at presentation, has fluctuated during this hospitalization, but was 1.35 on 06/10/12. Creatinine is now 2.07, although hypernatremia has improved (was 150-152). Will increase iv fluids.  5. Dysphagia: Unclear etiology. His MRI does not show an acute stroke, but showed a large to moderate left hemisphere CVA with encephalomalacia. Seen by SLP, but failed MBS because of hiccups. NG tube put in on 2 /22/14, which he pulled out, was reinserted on 06/09/12 and started on jevity at 37ml/hr. On 06/10/12, patient had nausea and vomiting, tube feeds were discontinued and and abdominal film ruled out any obstruction. Now on low intermittent suction. He was started on IV Protonix for positive guaiac of gastric secretions. IV Reglan was started on 06/12/12. NG-T continues to drain bilious liquid, albeit reduced in quantity. Will try clamping NG-T today.  6. Hypokalemia: Repleting as indicated.  7. Hiccups: These are intractable. Will manage with Thorazine.  8. RUE Swelling: RUE appears edematous on physical examination, without hyperemia. Will check venous doppler to exclude DVT.    Code Status: Full Code.  Family Communication:  Disposition Plan: To be determined. GOC meeting with palliative care carried out again today. Family want continued active management.    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 Intubated for airway protection under PCCM care 06/12/2012, and subsequently extubated on 06/05/12. He was transferred him to [facility] service on 2/21. On 06/07/12 he was found to be hypernatremic, in hypertensive urgency and had to be transferred to step down for IV labetalol drip. He was subsequently started on home anti hypertensive's given  to him orally  with puree as per the SLP recommendations. He failed MBS on 06/09/12 with high risk of aspiration. An NG tube was placed temporarily and feeds started with Jevity. He was was transferred back to the general medical floor on 06/09/12, but continued to decline clinically and on 06/10/12, he had another seizure episode. Brain MRI of 06/10/12 showed no acute infarct and remote moderate to large left hemispheric infarct with encephalomalacia. Repeat CXR did not show any infiltrates and he remained afebrile without any leukocytosis. A palliative care consult was requested and goals-of-care meeting carried out on 06/11/12. As per the discussion with palliative care, family members would like some time to come to terms with his prognosis and re meet on 06/13/12. They would like to continue with active treatments at this time.    Consultants:  Dr Clydie Braun, neurologist.   Lorinda Creed, NP, Palliative care medicine.   Procedures:  CXR.  Abdominal X-Ray  Brain MRI.   Antibiotics:  Rocephin 06/06/12-06/08/12.   Vancomycin discontinued on 06/05/12.  Zosyn discontinued on 06/06/12.   HPI/Subjective: Alert today, having hiccups.   Objective: Vital signs in last 24 hours: Temp:  [98.4 F (36.9 C)-99.1 F (37.3 C)] 98.6 F (37 C) (02/27 0800) Pulse Rate:  [102-110] 110 (02/27 0400) Resp:  [13-18] 18 (02/27 0400) BP: (149-187)/(79-103) 175/91 mmHg (02/27 0800) SpO2:  [94 %-99 %] 99 % (02/27 0400) Weight:  [87.9 kg (193 lb 12.6 oz)] 87.9 kg (193 lb 12.6 oz) (02/27 0500) Weight change: -1.8 kg (-3 lb 15.5 oz) Last BM Date: 06/07/12  Intake/Output from previous day: 02/26 0701 - 02/27 0700 In: 855 [I.V.:650; NG/GT:30; IV Piggyback:175] Out: 895 [Urine:195; Emesis/NG output:700] Total I/O In: 50 [I.V.:50] Out: 250 [Urine:250]   Physical Exam: Comfortable, alert, interactive, not short of breath at rest, following simple commands.  HEENT:  Mild clinical pallor, no jaundice, no  conjunctival injection or discharge. NECK:  Supple, JVP not seen, no carotid bruits, no palpable lymphadenopathy, no palpable goiter. CHEST:  Clinically clear to auscultation, no wheezes, no crackles. HEART:  Sounds 1 and 2 heard, normal, regular, no murmurs. ABDOMEN:  Full, soft, non-tender, no palpable organomegaly, no palpable masses, normal bowel sounds. GENITALIA:  Not examined. UPPER EXTREMITIES: RUE appears edematous, without hyperemia.  LOWER EXTREMITIES:  No pitting edema, palpable peripheral pulses. MUSCULOSKELETAL SYSTEM:  Generalized osteoarthritic changes, otherwise, normal. CENTRAL NERVOUS SYSTEM:  As above. Unable to formally examine, due to lack of cooperation.   Lab Results:  Recent Labs  06/12/12 0141 06/13/12 0500  WBC 4.6 5.5  HGB 10.2* 9.5*  HCT 31.1* 28.9*  PLT 169 207    Recent Labs  06/12/12 0141 06/13/12 0500  NA 150* 148*  K 3.7 3.4*  CL 119* 113*  CO2 21 24  GLUCOSE 182* 169*  BUN 27* 34*  CREATININE 1.67* 2.07*  CALCIUM 8.2* 8.5   Recent Results (from the past 240 hour(s))  STOOL CULTURE     Status: None   Collection Time    06/04/12  6:47 PM      Result Value Range Status   Specimen Description STOOL   Final   Special Requests NONE   Final   Culture     Final   Value: NO SALMONELLA, SHIGELLA, CAMPYLOBACTER, YERSINIA, OR E.COLI 0157:H7 ISOLATED   Report Status 06/08/2012 FINAL   Final     Studies/Results: Dg Chest Port 1 View  06/12/2012  *RADIOLOGY REPORT*  Clinical Data: Possible aspiration.  PORTABLE CHEST - 1 VIEW  Comparison:  Chest 06/10/2012.  Findings: The patient is rotated on the study.  There is a tiny left pleural effusion and mild basilar airspace disease, likely atelectasis.  Right lung is clear.  There is cardiomegaly but no edema.  NG tube courses into the stomach and below the inferior margin of the film.  IMPRESSION:  1.  Improved left basilar atelectasis with a tiny effusion noted. No new abnormality. 2.  Cardiomegaly.    Original Report Authenticated By: Holley Dexter, M.D.     Medications: Scheduled Meds: . antiseptic oral rinse  1 application Mouth Rinse QID  . chlorhexidine  15 mL Mouth/Throat BID  . cloNIDine  0.3 mg Transdermal Weekly  . hydrALAZINE  50 mg Oral Q8H  . insulin aspart  0-9 Units Subcutaneous Q4H  . lacosamide (VIMPAT) IV  100 mg Intravenous Q12H  . metoCLOPramide (REGLAN) injection  5 mg Intravenous Q6H  . metoprolol tartrate  100 mg Oral BID  . pantoprazole (PROTONIX) IV  40 mg Intravenous Q0600  . phenytoin (DILANTIN) IV  100 mg Intravenous TID   Continuous Infusions: . dextrose 50 mL/hr at 06/13/12 0453   PRN Meds:.acetaminophen (TYLENOL) oral liquid 160 mg/5 mL, chlorproMAZINE (THORAZINE) IV, hydrALAZINE, labetalol, LORazepam, ondansetron    LOS: 11 days   Crestina Strike,CHRISTOPHER  [facility] Pager [phone]. If 8PM-8AM, please contact night-coverage at [email or web address], password [facility] 06/13/2012, 11:27 AM  LOS: 11 days

## 2012-06-13 NOTE — Progress Notes (Signed)
Clinical Social Work Department BRIEF PSYCHOSOCIAL ASSESSMENT 06/13/2012  Patient:  Robert Mcclure, Robert Mcclure     Account Number:  [id number]     Admit date:  05/19/2012  Clinical Social Worker:  Lourdes Sledge  Date/Time:  06/13/2012 04:01 PM  Referred by:  RN  Date Referred:  06/13/2012 Referred for  SNF Placement   Other Referral:   Interview type:  Family Other interview type:   CSW completed assessment with daughter [name]) [name] [phone]    PSYCHOSOCIAL DATA Living Status:  ALONE Admitted from facility:   Level of care:   Primary support name:  [name]) [name] [phone] Primary support relationship to patient:  CHILD, ADULT Degree of support available:   Pt daughter [name]) [name] [phone] actively involved in pt care.    CURRENT CONCERNS Current Concerns  Post-Acute Placement   Other Concerns:    SOCIAL WORK ASSESSMENT / PLAN CSW informed that family had a palliative care meeting today and have decided to pursue aggressive treatment.    CSW contacted pt daughter [name]) [name] [phone] as pt is unable to participate in assessment. Daughter was very appreciative of call and confirmed that she would be agreeable to SNF placement for pt. Daughter did not have any questions or concerns for CSW however stated she would be agreeable to a SNF in [facility] besides [facility]. CSW informed daughter that CSW would do a SNF search as requested by daughter and would provide bed offers when available. Dtr will plan to visit pt tomorrow morning and look over SNF list.    CSW has received consent to do a SNF search for [facility].   Assessment/plan status:  Psychosocial Support/Ongoing Assessment of Needs Other assessment/ plan:   Information/referral to community resources:   CSW provided SNF list in pt room as well as CSW contact information. CSW will provide additional resources as requested/appropriate.    PATIENT'S/FAMILY'S  RESPONSE TO PLAN OF CARE: Pt unable to participate in assessment. CSW informed that daughter [name] [phone] is pt HCPOA. CSW also informed of palliative care meeting today to discuss GOC. Family has decided to pursue aggressive treatment. Pt daughter very receptive towards SNF placement and agreeable to a SNF search.        Theresia Bough, MSW, Theresia Majors [phone]

## 2012-06-13 NOTE — Progress Notes (Signed)
Pulled NG tube out from left nare. Kirtland Bouchard Schorr notified. Ordered to put in another NG. NG inserted into R nare. Patient tolerated insertion well. Auscultated placement. Hooked back to intermittent suction. Vitals stable. Will continue to monitor.   Rochele Pages, RN

## 2012-06-13 NOTE — Procedures (Signed)
EEG report.  Brief clinical history:  67years old male with a history of symptomatic post stroke seizures and recent admission to the hospital due to GTC status epilepticus that resolved but patient now more lethargic. Concerns for non convulsive SE. Technique: this is a 17 channel routine scalp EEG performed at the bedside with bipolar and m onopolar montages arranged in accordance to the international 10/20 system of electrode placement. One channel was dedicated to EKG recording.  Patient remains asleep throughout the entire recording. No activating procedures.  Description:no wakefulness was achieved during this study, and thus a reliable background rhythm is not discernible. For the most part, there is continuous, generalized 5 Hz activity that seems to be consistent with sleep. Stage 2 sleep showed symmetric and synchronous sleep spindles without intermixed epileptiform discharges. No focal or generalized epileptiform discharges noted.  No pathologic areas of slowing seen.  EKG showed sinus rhythm.  Impression: this is a normal asleep EEG.  Importantly, no evidence of electrographic seizures noted. Please, be aware that a normal EEG does not exclude the possibility of epilepsy.  Clinical correlation is advised.  [name], MD

## 2012-06-13 NOTE — Progress Notes (Signed)
VASCULAR LAB PRELIMINARY  PRELIMINARY  PRELIMINARY  PRELIMINARY  Right upper extremity venous Doppler completed.    Preliminary report:  There is no DVT or SVT noted in the right upper extremity.  Britiny Defrain, RVT 06/13/2012, 4:20 PM

## 2012-06-13 NOTE — Progress Notes (Signed)
Called Kirtland Bouchard Schorr of [facility] to clarify scheduled 100 mg tab of lopressor and 50 mg tab of hydralazine with NG tube putting out coffee ground secretions and patient NPO. Ordered to hold medication and give PRN BP medication if needed. Also notified her of patient's decreased urine output. No new orders received. Will continue to monitor.   Rochele Pages, RN

## 2012-06-14 DIAGNOSIS — R5381 Other malaise: Secondary | ICD-10-CM

## 2012-06-14 LAB — CBC
HCT: 29.5 % — ABNORMAL LOW (ref 39.0–52.0)
RDW: 14.9 % (ref 11.5–15.5)

## 2012-06-14 LAB — BASIC METABOLIC PANEL
BUN: 29 mg/dL — ABNORMAL HIGH (ref 6–23)
CO2: 22 mEq/L (ref 19–32)
Chloride: 113 mEq/L — ABNORMAL HIGH (ref 96–112)
GFR calc Af Amer: 44 mL/min — ABNORMAL LOW (ref >90)
Glucose, Bld: 171 mg/dL — ABNORMAL HIGH (ref 70–99)
Potassium: 3.6 mEq/L (ref 3.5–5.1)

## 2012-06-14 LAB — GLUCOSE, CAPILLARY
Glucose-Capillary: 166 mg/dL — ABNORMAL HIGH (ref 70–99)
Glucose-Capillary: 163 mg/dL — ABNORMAL HIGH (ref 70–99)

## 2012-06-14 MED ORDER — SODIUM CHLORIDE 0.9 % IV SOLN
12.5000 mg | Freq: Three times a day (TID) | INTRAVENOUS | Status: DC | PRN
  Filled 2012-06-14 (×2): qty 0.5

## 2012-06-14 MED ORDER — JEVITY 1.2 CAL PO LIQD
1000.0000 mL | ORAL | Status: DC
  Administered 2012-06-14: 1000 mL
  Administered 2012-06-15: 10 mL/h
  Filled 2012-06-14 (×4): qty 1000

## 2012-06-14 MED ORDER — METOPROLOL TARTRATE 1 MG/ML IV SOLN
5.0000 mg | Freq: Once | INTRAVENOUS | Status: AC
  Administered 2012-06-14: 5 mg via INTRAVENOUS

## 2012-06-14 MED ORDER — SODIUM CHLORIDE 0.9 % IJ SOLN
10.0000 mL | Freq: Two times a day (BID) | INTRAMUSCULAR | Status: DC
  Administered 2012-06-14 – 2012-06-18 (×8): 10 mL
  Administered 2012-06-19: 20 mL
  Administered 2012-06-20 – 2012-06-22 (×4): 10 mL
  Filled 2012-06-14: qty 10

## 2012-06-14 MED ORDER — METOPROLOL TARTRATE 1 MG/ML IV SOLN
5.0000 mg | Freq: Once | INTRAVENOUS | Status: AC
  Administered 2012-06-14: 5 mg via INTRAVENOUS
  Filled 2012-06-14: qty 5

## 2012-06-14 MED ORDER — ACETAMINOPHEN 650 MG RE SUPP
650.0000 mg | RECTAL | Status: DC | PRN
  Administered 2012-06-14 (×2): 650 mg via RECTAL
  Filled 2012-06-14 (×2): qty 1

## 2012-06-14 MED ORDER — SODIUM CHLORIDE 0.9 % IJ SOLN
10.0000 mL | INTRAMUSCULAR | Status: DC | PRN
  Administered 2012-06-17: 10 mL
  Filled 2012-06-14: qty 20

## 2012-06-14 NOTE — Progress Notes (Signed)
MEDICATION RELATED CONSULT NOTE - INITIAL   Pharmacy Consult for Phenytoin Indication: Seizure Disorder  No Known Allergies  Patient Measurements: Height: 6' (182.9 cm) Weight: 193 lb 2 oz (87.6 kg) IBW/kg (Calculated) : 77.6  Vital Signs: Temp: 99.9 F (37.7 C) (02/28 1138) Temp src: Axillary (02/28 1138) BP: 137/84 mmHg (02/28 1138) Pulse Rate: 121 (02/28 0400) Intake/Output from previous day: 02/27 0701 - 02/28 0700 In: 1085 [I.V.:1025; IV Piggyback:60] Out: 935 [Urine:490; Emesis/NG output:445] Intake/Output from this shift: Total I/O In: 75 [I.V.:75] Out: 75 [Urine:75]  Labs:  Recent Labs  06/12/12 0141 06/13/12 0500 06/13/12 1627 06/14/12 0432  WBC 4.6 5.5  --  7.4  HGB 10.2* 9.5*  --  9.8*  HCT 31.1* 28.9*  --  29.5*  PLT 169 207  --  214  CREATININE 1.67* 2.07*  --  1.77*  MG  --   --  2.1 2.2  ALBUMIN  --  2.4*  --   --   PROT  --  5.7*  --   --   AST  --  24  --   --   ALT  --  30  --   --   ALKPHOS  --  66  --   --   BILITOT  --  0.4  --   --    Estimated Creatinine Clearance: 44.5 ml/min (by C-G formula based on Cr of 1.77).   Microbiology: Recent Results (from the past 720 hour(s))  URINE CULTURE     Status: None   Collection Time    06/15/2012 12:11 PM      Result Value Range Status   Specimen Description URINE, CATHETERIZED   Final   Special Requests NONE   Final   Culture  Setup Time June 15, 2012 20:14   Final   Colony Count NO GROWTH   Final   Culture NO GROWTH   Final   Report Status 06/03/2012 FINAL   Final  URINE CULTURE     Status: None   Collection Time    June 15, 2012  5:45 PM      Result Value Range Status   Specimen Description URINE, CATHETERIZED   Final   Special Requests NONE   Final   Culture  Setup Time 06/03/2012 02:01   Final   Colony Count NO GROWTH   Final   Culture NO GROWTH   Final   Report Status 06/04/2012 FINAL   Final  MRSA PCR SCREENING     Status: None   Collection Time    15-Jun-2012  6:13 PM      Result Value  Range Status   MRSA by PCR NEGATIVE  NEGATIVE Final   Comment:            The GeneXpert MRSA Assay (FDA     approved for NASAL specimens     only), is one component of a     comprehensive MRSA colonization     surveillance program. It is not     intended to diagnose MRSA     infection nor to guide or     monitor treatment for     MRSA infections.  CULTURE, BLOOD (ROUTINE X 2)     Status: None   Collection Time    06/03/12  1:37 AM      Result Value Range Status   Specimen Description BLOOD RIGHT RADIAL ARTERIAL   Final   Special Requests BOTTLES DRAWN AEROBIC AND ANAEROBIC 5CC EA   Final  Culture  Setup Time 06/03/2012 08:25   Final   Culture NO GROWTH 5 DAYS   Final   Report Status 06/09/2012 FINAL   Final  CULTURE, BLOOD (ROUTINE X 2)     Status: None   Collection Time    06/03/12  1:50 AM      Result Value Range Status   Specimen Description BLOOD LEFT ARM   Final   Special Requests BOTTLES DRAWN AEROBIC ONLY 1CC   Final   Culture  Setup Time 06/03/2012 08:25   Final   Culture NO GROWTH 5 DAYS   Final   Report Status 06/09/2012 FINAL   Final  CULTURE, RESPIRATORY (NON-EXPECTORATED)     Status: None   Collection Time    06/03/12  3:51 AM      Result Value Range Status   Specimen Description TRACHEAL ASPIRATE   Final   Special Requests Normal   Final   Gram Stain     Final   Value: ABUNDANT WBC PRESENT, PREDOMINANTLY PMN     RARE SQUAMOUS EPITHELIAL CELLS PRESENT     MODERATE GRAM POSITIVE COCCI IN PAIRS     RARE GRAM POSITIVE RODS   Culture Non-Pathogenic Oropharyngeal-type Flora Isolated.   Final   Report Status 06/05/2012 FINAL   Final  STOOL CULTURE     Status: None   Collection Time    06/04/12  6:47 PM      Result Value Range Status   Specimen Description STOOL   Final   Special Requests NONE   Final   Culture     Final   Value: NO SALMONELLA, SHIGELLA, CAMPYLOBACTER, YERSINIA, OR E.COLI 0157:H7 ISOLATED   Report Status 06/08/2012 FINAL   Final    Medical  History: Past Medical History  Diagnosis Date  . Hypertension   . Diabetes mellitus   . CVA (cerebral vascular accident)   . Stroke     Medications:  Fosphenytoin 1790 PE (20mg /kg phenytoin) x 1 on 2/26 @ 1537 Phenytoin 100mg  IV TID received 3 doses on 2/27 @ 0924, 1803, 2141, and one dose today @ 1033  Assessment: 35 YOM with hx of post-stroke seizure disorder admitted for status epilepticus, originally started keppra, now stopped d/t somnolence. Had seizure on 2/26 and loaded with fosphenytoin and started phenytoin IV 100mg  TID as above. Phenytoin level obtained yesterday at 1627 was 16.2, adjusted level = 28. This level still reflect the loading dose. Scr = 1.77, no new seizure activity today   Goal of Therapy:  Adjusted phenytoin level = 10-20  Plan:  - Continue phenytoin 100mg  IV Q 8hrs - Monitor seizure activity and vitals - Check phenytoin trough level after 3 days  Bayard Hugger, PharmD, BCPS  Clinical Pharmacist  Pager: [phone]   06/14/2012,2:43 PM

## 2012-06-14 NOTE — Progress Notes (Signed)
PT Cancellation Note  Patient Details Name: Robert Mcclure MRN: [id number] DOB: 11-Oct-1945   Cancelled Treatment:    Reason Eval/Treat Not Completed: Patient at procedure or test/unavailable (getting PICC line placed)   Lurena Joiner B. [name], PT, DPT [phone]   06/14/2012, 2:54 PM

## 2012-06-14 NOTE — Progress Notes (Signed)
Peripherally Inserted Central Catheter/Midline Placement  The IV Nurse has discussed with the patient and/or persons authorized to consent for the patient, the purpose of this procedure and the potential benefits and risks involved with this procedure.  The benefits include less needle sticks, lab draws from the catheter and patient may be discharged home with the catheter.  Risks include, but not limited to, infection, bleeding, blood clot (thrombus formation), and puncture of an artery; nerve damage and irregular heat beat.  Alternatives to this procedure were also discussed. Family present and signed consent  PICC/Midline Placement Documentation        Mellissa Kohut 06/14/2012, 3:28 PM

## 2012-06-14 NOTE — Progress Notes (Signed)
[facility] PROGRESS NOTE  TEMILOLUWA LAREDO ZOX:[id number] DOB: [birth year] DOA: 05/24/2012 PCP: Jeri Cos, MD  Assessment/Plan: Active Problems:   HTN (hypertension), malignant   CVA (cerebral vascular accident)   Diabetes mellitus   Acute respiratory failure   Aspiration pneumonia   Encephalopathy, metabolic   Dysphagia, unspecified   Altered mental status   Weakness generalized    1. Seizure disorder: Patient was admitted in status epilepticus. Dr Clydie Braun provided neurology consultation, and patient was commenced on iv Keppra. He has since had breakthrough seizures. On 06/10/12, seizure episode was recorded, which was terminated with 2 mg IV Ativan. As of 06/12/12, patient was hypersomnolent, although responsive to noxious stimuli and continued to have intermittent jerking movements of left shoulder and arm, observed by this MD. Dr Leroy Kennedy was re-consulted, and he has modified patient's anti-convulsant medication, with good effect. Repeat EEG on 06/13/12, was devoid of seizure activity. Keppra level was 63 on 06/11/12. And 37.5 on 06/12/12. Keppra is currently on hold. Today, levels are <5. However, although patient was alert and interactive on 06/13/12, he is somnolent this AM. Will therefore hold off on reinstatement of Keppra for now, unless neurologist indicates otherwise  2. Acute respiratory failure: Patient was intubated 05/28/2012-06/05/12, for inability to protect airway, due to recurrent seizure activity and AMS. He was successfully extubated on 06/05/12, and currently has O2 Sats at 95%-97% on 2L. Possible aspiration pneumonitis was suspected at the time. Repeat CXR on 06/10/12, did not show any new infiltrates. Antibiotics were discontinued on 06/08/12. Patient remains afebrile. PCXR of 06/12/12 showed improved left basilar atelectasis with a tiny effusion noted. No new abnormality. 3. Malignant Hypertension: Patient was on mulitple antihypertensive medications,  pre-admission, including Clonidine, beta-blockers, which have been held for status epilepticus. As he is currently NPO, managing with iv Hydralazine, Lopressor and Clonidine patch, as well as prn Labetalol. BP has improved today. 4. Dehydration/Acute on chronic kidney disease stage 3: Creatinine was 1.50 at presentation, has fluctuated during this hospitalization, but was 1.35 on 06/10/12. Creatinine is now 2.07, although hypernatremia has improved (was 150-152). Increased iv fluids on 06/13/12, and creatinine has improved, as has sodium levels. Patient is tachycardic, so will increase iv fluids further, to 100 ml/hr.  5. Dysphagia: Unclear etiology. His MRI does not show an acute stroke, but showed a large to moderate left hemisphere CVA with encephalomalacia. Seen by SLP, but failed MBS because of hiccups. NG tube put in on 2 /22/14, which he pulled out, was reinserted on 06/09/12 and started on jevity at 39ml/hr. On 06/10/12, patient had nausea and vomiting, tube feeds were discontinued and and abdominal film ruled out any obstruction. He was started on IV Protonix for positive guaiac of gastric secretions. IV Reglan was started on 06/12/12. NG-T was clamped intermittently on 06/13/12, and now has only minimal drainage. Will restart "trickle" of Jevity today.  6. Hypokalemia: Repleting as indicated.  7. Hiccups: These are intractable, but have responded to Thorazine. Will change to prn only, as may be contributory to drowsiness.  8. RUE Swelling: RUE appears edematous on physical examination, without hyperemia. Venous doppler was negative for DVT.    Code Status: Full Code.  Family Communication:  Disposition Plan: To be determined. GOC meeting with palliative care carried out again today. Family want continued active management.    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 Intubated for airway protection under PCCM care 06-08-12, and subsequently extubated on 06/05/12. He was transferred him to [facility] service on 2/21. On 06/07/12 he was found to be hypernatremic, in hypertensive urgency and had to be transferred to step down for IV labetalol drip. He was subsequently started on home anti hypertensive's given to him orally with puree as per the SLP recommendations. He failed MBS on 06/09/12 with high risk of aspiration. An NG tube was placed temporarily and feeds started with Jevity. He was was transferred back to the general medical floor on 06/09/12, but continued to decline clinically and on 06/10/12, he had another seizure episode. Brain MRI of 06/10/12 showed no acute infarct and remote moderate to large left hemispheric infarct with encephalomalacia. Repeat CXR did not show any infiltrates and he remained afebrile without any leukocytosis. A palliative care consult was requested and goals-of-care meeting carried out on 06/11/12. As per the discussion with palliative care, family members would like some time to come to terms with his prognosis and re meet on 06/13/12. They would like to continue with active treatments at this time.    Consultants:  Dr Clydie Braun, neurologist.   Lorinda Creed, NP, Palliative care medicine.   Procedures:  CXR.  Abdominal X-Ray  Brain MRI.   Antibiotics:  Rocephin 06/06/12-06/08/12.   Vancomycin discontinued on 06/05/12.  Zosyn discontinued on 06/06/12.   HPI/Subjective: Somnolent.    Objective: Vital signs in last 24 hours: Temp:  [98.6 F (37 C)-100.1 F (37.8 C)] 99.5 F (37.5 C) (02/28 0735) Pulse Rate:  [112-130] 121 (02/28 0400) Resp:  [15-23] 15 (02/28 0400) BP: (116-165)/(67-90) 116/70 mmHg (02/28 0735) SpO2:  [94 %-97 %] 94 % (02/28 0735) Weight:  [87.6 kg (193 lb 2 oz)] 87.6 kg (193 lb 2 oz) (02/28 0400) Weight change: -0.3 kg (-10.6 oz) Last BM Date: 06/07/12  Intake/Output from previous day: 02/27  0701 - 02/28 0700 In: 1085 [I.V.:1025; IV Piggyback:60] Out: 935 [Urine:490; Emesis/NG output:445] Total I/O In: 75 [I.V.:75] Out: 75 [Urine:75]   Physical Exam: Somnolent.  HEENT:  Mild clinical pallor, no jaundice, no conjunctival injection or discharge. NECK:  Supple, JVP not seen, no carotid bruits, no palpable lymphadenopathy, no palpable goiter. CHEST:  Clinically clear to auscultation, no wheezes, no crackles. HEART:  Sounds 1 and 2 heard, normal, regular, no murmurs. ABDOMEN:  Full, soft, non-tender, no palpable organomegaly, no palpable masses, normal bowel sounds. GENITALIA:  Not examined. UPPER EXTREMITIES: RUE appears edematous, without hyperemia.  LOWER EXTREMITIES:  No pitting edema, palpable peripheral pulses. MUSCULOSKELETAL SYSTEM:  Generalized osteoarthritic changes, otherwise, normal. CENTRAL NERVOUS SYSTEM:  As above. Unable to formally examine, due to lack of cooperation.   Lab Results:  Recent Labs  06/13/12 0500 06/14/12 0432  WBC 5.5 7.4  HGB 9.5* 9.8*  HCT 28.9* 29.5*  PLT 207 214    Recent Labs  06/13/12 0500 06/14/12 0432  NA 148* 146*  K 3.4* 3.6  CL 113* 113*  CO2 24 22  GLUCOSE 169* 171*  BUN 34* 29*  CREATININE 2.07* 1.77*  CALCIUM 8.5 8.4   Recent Results (from the past 240 hour(s))  STOOL CULTURE     Status: None   Collection Time    06/04/12  6:47 PM      Result Value Range Status   Specimen Description STOOL   Final   Special Requests NONE   Final   Culture     Final   Value: NO SALMONELLA, SHIGELLA, CAMPYLOBACTER,  YERSINIA, OR E.COLI 0157:H7 ISOLATED   Report Status 06/08/2012 FINAL   Final     Studies/Results: Dg Chest Port 1 View  06/12/2012  *RADIOLOGY REPORT*  Clinical Data: Possible aspiration.  PORTABLE CHEST - 1 VIEW  Comparison: Chest 06/10/2012.  Findings: The patient is rotated on the study.  There is a tiny left pleural effusion and mild basilar airspace disease, likely atelectasis.  Right lung is clear.  There  is cardiomegaly but no edema.  NG tube courses into the stomach and below the inferior margin of the film.  IMPRESSION:  1.  Improved left basilar atelectasis with a tiny effusion noted. No new abnormality. 2.  Cardiomegaly.   Original Report Authenticated By: Holley Dexter, M.D.     Medications: Scheduled Meds: . antiseptic oral rinse  1 application Mouth Rinse QID  . chlorhexidine  15 mL Mouth/Throat BID  . chlorproMAZINE (THORAZINE) IV  12.5 mg Intravenous Q6H  . cloNIDine  0.3 mg Transdermal Weekly  . hydrALAZINE  10 mg Intravenous Q6H  . insulin aspart  0-9 Units Subcutaneous Q4H  . lacosamide (VIMPAT) IV  100 mg Intravenous Q12H  . metoCLOPramide (REGLAN) injection  5 mg Intravenous Q6H  . metoprolol  5 mg Intravenous Q6H  . pantoprazole (PROTONIX) IV  40 mg Intravenous Q0600  . phenytoin (DILANTIN) IV  100 mg Intravenous TID   Continuous Infusions: . dextrose 75 mL/hr at 06/13/12 1203   PRN Meds:.acetaminophen (TYLENOL) oral liquid 160 mg/5 mL, acetaminophen, labetalol, LORazepam, ondansetron    LOS: 12 days   Kwabena Strutz,CHRISTOPHER  [facility] Pager [phone]. If 8PM-8AM, please contact night-coverage at [email or web address], password [facility] 06/14/2012, 9:16 AM  LOS: 12 days

## 2012-06-14 NOTE — Progress Notes (Signed)
Clinical Child psychotherapist (CSW) provided pt daughter with bed offers. Daughter Tyler Aas very Adult nurse. CSW to remain following.  Theresia Bough, MSW, Theresia Majors [phone]

## 2012-06-14 NOTE — Progress Notes (Signed)
Progress Note from the Palliative Medicine Team at [facility]  Subjective: patient is awaken and communicating with yes and no answerers, follows commands   --sister Tyler Aas at bedside along with patients son  Objective: No Known Allergies Scheduled Meds: . antiseptic oral rinse  1 application Mouth Rinse QID  . chlorhexidine  15 mL Mouth/Throat BID  . cloNIDine  0.3 mg Transdermal Weekly  . feeding supplement (JEVITY 1.2 CAL)  1,000 mL Per Tube Q24H  . hydrALAZINE  10 mg Intravenous Q6H  . insulin aspart  0-9 Units Subcutaneous Q4H  . lacosamide (VIMPAT) IV  100 mg Intravenous Q12H  . metoCLOPramide (REGLAN) injection  5 mg Intravenous Q6H  . metoprolol  5 mg Intravenous Q6H  . pantoprazole (PROTONIX) IV  40 mg Intravenous Q0600  . phenytoin (DILANTIN) IV  100 mg Intravenous TID  . sodium chloride  10-40 mL Intracatheter Q12H   Continuous Infusions: . dextrose 100 mL/hr at 06/14/12 1033   PRN Meds:.acetaminophen (TYLENOL) oral liquid 160 mg/5 mL, acetaminophen, chlorproMAZINE (THORAZINE) IV, labetalol, LORazepam, ondansetron, sodium chloride  BP 137/84  Pulse 121  Temp(Src) 99.9 F (37.7 C) (Axillary)  Resp 15  Ht 6' (1.829 m)  Wt 87.6 kg (193 lb 2 oz)  BMI 26.19 kg/m2  SpO2 98%   PPS:30 % at best  Pain Score:denies   Intake/Output Summary (Last 24 hours) at 06/14/12 1537 Last data filed at 06/14/12 1138  Gross per 24 hour  Intake   1110 ml  Output    640 ml  Net    470 ml       Physical Exam: General: chronically ill appearing,  Alert, generally uncomfortable  HEENT: Moist mucous membranes, NG to wall suction Chest: Scattered coarse BS, diminished in bases CVS: RRR  Abdomen: soft NT +BS  Ext: without edema, hands mitted  Neuro: eyes open, tracking, able to follow commands, and verbalizes yes and no answers      Labs: CBC    Component Value Date/Time   WBC 7.4 06/14/2012 0432   RBC 3.12* 06/14/2012 0432   HGB 9.8* 06/14/2012 0432   HCT 29.5* 06/14/2012  0432   PLT 214 06/14/2012 0432   MCV 94.6 06/14/2012 0432   MCH 31.4 06/14/2012 0432   MCHC 33.2 06/14/2012 0432   RDW 14.9 06/14/2012 0432   LYMPHSABS 4.1* 06/01/2012 1152   MONOABS 0.7 05/20/2012 1152   EOSABS 0.1 06/13/2012 1152   BASOSABS 0.1 05/30/2012 1152    BMET    Component Value Date/Time   NA 146* 06/14/2012 0432   K 3.6 06/14/2012 0432   CL 113* 06/14/2012 0432   CO2 22 06/14/2012 0432   GLUCOSE 171* 06/14/2012 0432   BUN 29* 06/14/2012 0432   CREATININE 1.77* 06/14/2012 0432   CALCIUM 8.4 06/14/2012 0432   GFRNONAA 38* 06/14/2012 0432   GFRAA 44* 06/14/2012 0432    CMP     Component Value Date/Time   NA 146* 06/14/2012 0432   K 3.6 06/14/2012 0432   CL 113* 06/14/2012 0432   CO2 22 06/14/2012 0432   GLUCOSE 171* 06/14/2012 0432   BUN 29* 06/14/2012 0432   CREATININE 1.77* 06/14/2012 0432   CALCIUM 8.4 06/14/2012 0432   PROT 5.7* 06/13/2012 0500   ALBUMIN 2.4* 06/13/2012 0500   AST 24 06/13/2012 0500   ALT 30 06/13/2012 0500   ALKPHOS 66 06/13/2012 0500   BILITOT 0.4 06/13/2012 0500   GFRNONAA 38* 06/14/2012 0432   GFRAA 44* 06/14/2012 1610  Assessment and Plan: 1. Code Status: Full Code--Focus of care is provide all available interventions to prolong life, hopeful for improvement and return to [place] 2. Symptom Control:  Dysphagia: progress with speech therapy recommendations, family would consider a PEG 3. Psycho/Social: Emotional support offered to patient and family at bedside, Opened opportunity for discussion regarding aggressive treatment plan.  I believe there is discrepancy among siblings to what is the best path for this patient. 4. Spiritual Chaplain: consulted 5. Disposition: Dependant on outcomes, family is anticipating SNF for rehabilition once stable   Patient Documents Completed or Given: Document Given Completed  Advanced Directives Pkt    MOST    DNR    Gone from My Sight    Hard Choices  yes    Time In Time Out Total Time Spent with Patient  Total Overall Time  1300 1325 25 min 25 min    Greater than 50%  of this time was spent counseling and coordinating care related to the above assessment and plan.   Lorinda Creed NP  Palliative Medicine Team Team Phone # [phone] Pager [phone]  1

## 2012-06-14 NOTE — Progress Notes (Signed)
Physical Therapy Treatment Patient Details Name: Robert Mcclure MRN: [id number] DOB: 09/28/1945 Today's Date: 06/14/2012 Time: [phone] PT Time Calculation (min): 34 min  PT Assessment / Plan / Recommendation Comments on Treatment Session  Pt was more alert and participative today until fatigued and then he seemed to shut down mentally and physically.  He followed some commands, tried to say and recognize people sitting in the room and was able to help with bed mobility (up) and sitting posture with extra time needed to process commands.      Follow Up Recommendations  SNF     Does the patient have the potential to tolerate intense rehabilitation   NA  Barriers to Discharge  none      Equipment Recommendations  None recommended by PT    Recommendations for Other Services    Frequency Min 3X/week   Plan Discharge plan remains appropriate;Frequency remains appropriate    Precautions / Restrictions Precautions Precautions: Fall Precaution Comments: right hemiplegia Restrictions Weight Bearing Restrictions: No Other Position/Activity Restrictions: pt has multiple lines, wearing mittens with bed alarm on.  He has an NG tube to suction in his nose.     Pertinent Vitals/Pain VSS    Mobility  Bed Mobility Bed Mobility: Supine to Sit Supine to Sit: 2: Max assist;With rails;HOB elevated Sitting - Scoot to Edge of Bed: 2: Max assist Sit to Supine: 1: +1 Total assist Scooting to HOB: 1: +2 Total assist Scooting to [facility]: Patient Percentage: 0% Details for Bed Mobility Assistance: To get to sitting pt was able to reach with his left hand for railing and pull with left leg and left arm to help progress to EOB.  HOB was 45 degrees.  Assist needed of right leg and to help progress left leg all the way off the side of the bed.  Assist also needed to support trunk to get to sitting and once sitting verbal cues for hand placement on railing at end of the bed.        PT Goals Acute  Rehab PT Goals PT Goal: Supine/Side to Sit - Progress: Progressing toward goal PT Goal: Sit to Supine/Side - Progress: Progressing toward goal  Visit Information  Last PT Received On: 06/14/12 Assistance Needed: +2 Reason Eval/Treat Not Completed: Patient at procedure or test/unavailable (getting PICC line placed)    Subjective Data  Subjective: Pt stating one word responses 3-4 times during treatment session.  When asked his sister's name he said " cookie" sister reports that is her nickname.   Patient Stated Goal: None stated   Cognition  Cognition Overall Cognitive Status: Impaired Area of Impairment: Attention;Memory;Following commands Arousal/Alertness: Awake/alert (RN states this is the most alert she has seen him) Orientation Level: Person Current Attention Level: Focused Following Commands: Follows one step commands with increased time;Follows one step commands inconsistently Cognition - Other Comments: waxed and wained during session.  Beginning was better, as he fatigued less responsive and alert.      Balance  Static Sitting Balance Static Sitting - Balance Support: Left upper extremity supported;Feet supported Static Sitting - Level of Assistance: 2: Max assist Static Sitting - Comment/# of Minutes: max assist sitting EOB working on anterior and posterior weight shifts, medial and lateral weight shifts (manually put right hand and elbow to WB during weight shifts), upright posture, head neck and trunk extension and decreased pulling to the left side.  Pt fatigued quickly after >15 mins seated EOB.    End of Session PT - End  of Session Activity Tolerance: Patient limited by fatigue Patient left: in bed;with call bell/phone within reach;with family/visitor present (son and sister) Nurse Communication: Mobility status        Lurena Joiner B. Damarien Nyman, PT, DPT [phone]   06/14/2012, 5:28 PM

## 2012-06-14 NOTE — Progress Notes (Signed)
NUTRITION FOLLOW UP  Intervention:    Start Jevity 1.2 at 10 ml/h today, run for 12-24 hours to assess tolerance.  If tolerating TF well at a low rate, recommend increase by 10 ml every 8 hours to goal rate of 70 ml/h with Prostat 30 ml once daily to provide 2116 kcals, 108 gm protein, 1361 ml free water daily.  Recommend swallow evaluation with SLP to determine safest diet consistency.  Nutrition Dx:   Inadequate oral intake related to inability to eat as evidenced by NPO status. -  ongoing  Goal:   Intake to meet >90% of estimated nutrition needs, unmet.  Monitor:    TF tolerance/adequacy, swallowing function and diet advancement, weight trend, labs.  Assessment:   SLP following for trials of PO's and swallowing treatment.  Patient not ready for PO's for the past few days due to NG tube to suction draining red/brown liquid.  TF has been on  hold.  Noted plans to resume trickle feedings today--Jevity 1.2 at 10 ml/h has been ordered.  Height: Ht Readings from Last 1 Encounters:  06/01/2012 6' (1.829 m)    Weight Status:   Wt Readings from Last 1 Encounters:  06/14/12 193 lb 2 oz (87.6 kg)  06/10/12  196 lb 9.6 oz (89.177 kg)  Admit weight: 05/20/2012 - 206 lbs - weight trending down since admission   Body mass index is 26.19 kg/(m^2).  Re-estimated needs:  Kcal: 2000-2200 Protein: 105-120 gm Fluid: 2.0-2.2 L  Skin: Intact  Diet Order: NPO  Jevity 1.2 at 10 ml/h via NG tube will provide 288 kcals, 13 gm protein, 194 ml free water daily  Last BM: 2/21  Labs:   Recent Labs Lab 06/12/12 0141 06/13/12 0500 06/13/12 1627 06/14/12 0432  NA 150* 148*  --  146*  K 3.7 3.4*  --  3.6  CL 119* 113*  --  113*  CO2 21 24  --  22  BUN 27* 34*  --  29*  CREATININE 1.67* 2.07*  --  1.77*  CALCIUM 8.2* 8.5  --  8.4  MG  --   --  2.1 2.2  GLUCOSE 182* 169*  --  171*    CBG (last 3)   Recent Labs  06/14/12 0355 06/14/12 0738 06/14/12 1134  GLUCAP 149* 138* 163*     Scheduled Meds: . antiseptic oral rinse  1 application Mouth Rinse QID  . chlorhexidine  15 mL Mouth/Throat BID  . cloNIDine  0.3 mg Transdermal Weekly  . feeding supplement (JEVITY 1.2 CAL)  1,000 mL Per Tube Q24H  . hydrALAZINE  10 mg Intravenous Q6H  . insulin aspart  0-9 Units Subcutaneous Q4H  . lacosamide (VIMPAT) IV  100 mg Intravenous Q12H  . metoCLOPramide (REGLAN) injection  5 mg Intravenous Q6H  . metoprolol  5 mg Intravenous Q6H  . pantoprazole (PROTONIX) IV  40 mg Intravenous Q0600  . phenytoin (DILANTIN) IV  100 mg Intravenous TID    Continuous Infusions: . dextrose 100 mL/hr at 06/14/12 1033     Joaquin Courts, RD, LDN, CNSC Pager# [phone] After Hours Pager# [phone]

## 2012-06-15 ENCOUNTER — Inpatient Hospital Stay (HOSPITAL_COMMUNITY): Payer: PRIVATE HEALTH INSURANCE

## 2012-06-15 LAB — COMPREHENSIVE METABOLIC PANEL
ALT: 33 U/L (ref 0–53)
AST: 30 U/L (ref 0–37)
Albumin: 2.6 g/dL — ABNORMAL LOW (ref 3.5–5.2)
Alkaline Phosphatase: 87 U/L (ref 39–117)
CO2: 24 mEq/L (ref 19–32)
Chloride: 111 mEq/L (ref 96–112)
GFR calc non Af Amer: 38 mL/min — ABNORMAL LOW (ref >90)
Potassium: 3.7 mEq/L (ref 3.5–5.1)
Sodium: 144 mEq/L (ref 135–145)
Total Bilirubin: 0.5 mg/dL (ref 0.3–1.2)

## 2012-06-15 LAB — GLUCOSE, CAPILLARY
Glucose-Capillary: 161 mg/dL — ABNORMAL HIGH (ref 70–99)
Glucose-Capillary: 157 mg/dL — ABNORMAL HIGH (ref 70–99)
Glucose-Capillary: 151 mg/dL — ABNORMAL HIGH (ref 70–99)
Glucose-Capillary: 244 mg/dL — ABNORMAL HIGH (ref 70–99)
Glucose-Capillary: 229 mg/dL — ABNORMAL HIGH (ref 70–99)

## 2012-06-15 LAB — CBC
MCV: 96.3 fL (ref 78.0–100.0)
Platelets: 205 10*3/uL (ref 150–400)
RBC: 3.01 MIL/uL — ABNORMAL LOW (ref 4.22–5.81)
RDW: 15.1 % (ref 11.5–15.5)
WBC: 8.4 10*3/uL (ref 4.0–10.5)

## 2012-06-15 MED ORDER — ALTEPLASE 2 MG IJ SOLR
2.0000 mg | Freq: Once | INTRAMUSCULAR | Status: AC
  Administered 2012-06-15: 2 mg
  Filled 2012-06-15: qty 2

## 2012-06-15 MED ORDER — HYDRALAZINE HCL 20 MG/ML IJ SOLN
20.0000 mg | Freq: Four times a day (QID) | INTRAMUSCULAR | Status: DC
  Administered 2012-06-15 (×3): 20 mg via INTRAVENOUS
  Filled 2012-06-15 (×4): qty 1

## 2012-06-15 MED ORDER — JEVITY 1.2 CAL PO LIQD
1000.0000 mL | ORAL | Status: DC
  Administered 2012-06-15: 800 mL
  Filled 2012-06-15 (×3): qty 1000

## 2012-06-15 NOTE — Progress Notes (Addendum)
[facility] PROGRESS NOTE  Robert Mcclure:[id number] DOB: [birth year] DOA: 06/30/12 PCP: Jeri Cos, MD  Assessment/Plan: Active Problems:   HTN (hypertension), malignant   CVA (cerebral vascular accident)   Diabetes mellitus   Acute respiratory failure   Aspiration pneumonia   Encephalopathy, metabolic   Dysphagia, unspecified   Altered mental status   Weakness generalized    1. Seizure disorder: Patient was admitted in status epilepticus. Dr Clydie Braun provided neurology consultation, and patient was commenced on iv Keppra. He has since had breakthrough seizures. On 06/10/12, seizure episode was recorded, which was terminated with 2 mg IV Ativan. As of 06/12/12, patient was hypersomnolent, although responsive to noxious stimuli and continued to have intermittent jerking movements of left shoulder and arm, observed by this MD. Dr Leroy Kennedy was re-consulted, and he has modified patient's anti-convulsant medication with addition of Vimpat and Dilantin, with good effect. Repeat EEG on 06/13/12, was devoid of seizure activity. Keppra level was 63 on 06/11/12. And 37.5 on 06/12/12. Keppra is currently on hold. On 06/14/12, levels were <5. However, although patient was alert and interactive on 06/13/12, he was somnolent on 06/14/12. Have held off on reinstatement of Keppra for now, unless neurologist indicates otherwise. Today, patient is once again, alert and following simple commands.  2. Acute respiratory failure: Patient was intubated 06/30/2012-06/05/12, for inability to protect airway, due to recurrent seizure activity and AMS. He was successfully extubated on 06/05/12, and currently has O2 Sats at 95%-97% on 2L. Possible aspiration pneumonitis was suspected at the time. Repeat CXR on 06/10/12, did not show any new infiltrates. Antibiotics were discontinued on 06/08/12. Patient remains afebrile. PCXR of 06/12/12 showed improved left basilar atelectasis with a tiny effusion noted. No new  abnormality. Saturating at 925-95% on 0-2L. Today, appears to have some gurgling in the upper airways, improved by suctioning. Will repeat PCXR.  3. Malignant Hypertension: Patient was on mulitple antihypertensive medications, pre-admission, including Clonidine, beta-blockers, which have been held for status epilepticus. As he is currently NPO, managing with iv Hydralazine, Lopressor and Clonidine patch, as well as prn Labetalol. BP has improved but is still sub-optimal. Have increased Hydralazine dose today.  4. Dehydration/Acute on chronic kidney disease stage 3: Creatinine was 1.50 at presentation, has fluctuated during this hospitalization, but was 1.35 on 06/10/12. Creatinine had crept up to 2.07 as of 06/12/12, although hypernatremia has improved (was 150-152). Increased hypotonic iv fluids, and hypernatremia has resolve. Renal indices have also improved. Following lytes.  5. Dysphagia: Unclear etiology. His MRI does not show an acute stroke, but showed a large to moderate left hemisphere CVA with encephalomalacia. Seen by SLP, but failed MBS because of hiccups. NG tube put in on 2 /22/14, which he pulled out, was reinserted on 06/09/12 and started on jevity at 3ml/hr. On 06/10/12, patient had nausea and vomiting, tube feeds were discontinued and and abdominal film ruled out any obstruction. He was started on IV Protonix for positive guaiac of gastric secretions. IV Reglan was started on 06/12/12. NG-T was clamped intermittently on 06/13/12, had only minimal drainage, and "trickle" of Jevity was commenced on 06/14/12. Patient appears to be tolerating this, so will request SLP re=eval today. .  6. Hypokalemia: Repleting as indicated.  7. Hiccups: These were initially intractable, but have responded to Thorazine/Resolved. Thorazine changed to prn only, as may be contributory to drowsiness.  8. RUE Swelling: RUE appears edematous on physical examination, without hyperemia. Venous doppler was negative for DVT.   9. DM: This is  type 2, and CBGs appear reasonably controlled on SSI.    Code Status: Full Code.  Family Communication:  Disposition Plan: To be determined. GOC meeting with palliative care carried out again today. Family want continued active management.    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 Intubated for airway protection under PCCM care 05/28/2012, and subsequently extubated on 06/05/12. He was transferred him to [facility] service on 2/21. On 06/07/12 he was found to be hypernatremic, in hypertensive urgency and had to be transferred to step down for IV labetalol drip. He was subsequently started on home anti hypertensive's given to him orally with puree as per the SLP recommendations. He failed MBS on 06/09/12 with high risk of aspiration. An NG tube was placed temporarily and feeds started with Jevity. He was was transferred back to the general medical floor on 06/09/12, but continued to decline clinically and on 06/10/12, he had another seizure episode. Brain MRI of 06/10/12 showed no acute infarct and remote moderate to large left hemispheric infarct with encephalomalacia. Repeat CXR did not show any infiltrates and he remained afebrile without any leukocytosis. A palliative care consult was requested and goals-of-care meeting carried out on 06/11/12. As per the discussion with palliative care, family members would like some time to come to terms with his prognosis and re meet on 06/13/12. They would like to continue with active treatments at this time.    Consultants:  Dr Clydie Braun, neurologist.   Lorinda Creed, NP, Palliative care medicine.   Procedures:  CXR.  Abdominal X-Ray  Brain MRI.   Antibiotics:  Rocephin 06/06/12-06/08/12.   Vancomycin discontinued on 06/05/12.  Zosyn discontinued on 06/06/12.   HPI/Subjective: Alert, follows simple  commands.   Objective: Vital signs in last 24 hours: Temp:  [98.2 F (36.8 C)-101.8 F (38.8 C)] 99 F (37.2 C) (03/01 0700) Pulse Rate:  [110-127] 120 (03/01 0550) Resp:  [14-25] 18 (03/01 0550) BP: (137-183)/(74-93) 167/84 mmHg (03/01 0955) SpO2:  [92 %-98 %] 95 % (03/01 0550) Weight:  [89.8 kg (197 lb 15.6 oz)] 89.8 kg (197 lb 15.6 oz) (03/01 0500) Weight change: 2.2 kg (4 lb 13.6 oz) Last BM Date: 06/07/12  Intake/Output from previous day: 02/28 0701 - 03/01 0700 In: 2659.6 [I.V.:2409.6; NG/GT:200; IV Piggyback:50] Out: 900 [Urine:850; Emesis/NG output:50]     Physical Exam: GENERAL: Alert, following simple commands, not short of breath at rest.  HEENT:  Mild clinical pallor, no jaundice, no conjunctival injection or discharge. NECK:  Supple, JVP not seen, no carotid bruits, no palpable lymphadenopathy, no palpable goiter. CHEST:  Has gurgling sounds in throat. Clinically clear to auscultation, no wheezes, no crackles. Has large airway noises bilaterally.  HEART:  Sounds 1 and 2 heard, normal, regular, no murmurs. ABDOMEN:  Full, soft, non-tender, no palpable organomegaly, no palpable masses, normal bowel sounds. GENITALIA:  Not examined. UPPER EXTREMITIES: RUE appears mildly edematous, without hyperemia.  LOWER EXTREMITIES:  No pitting edema, palpable peripheral pulses. MUSCULOSKELETAL SYSTEM:  Generalized osteoarthritic changes, otherwise, normal. CENTRAL NERVOUS SYSTEM:  As above. Unable to formally examine, due to lack of cooperation.   Lab Results:  Recent Labs  06/14/12 0432 06/15/12 0500  WBC 7.4 8.4  HGB 9.8* 9.5*  HCT 29.5* 29.0*  PLT 214 205    Recent Labs  06/14/12 0432 06/15/12 0500  NA 146* 144  K 3.6 3.7  CL  113* 111  CO2 22 24  GLUCOSE 171* 180*  BUN 29* 26*  CREATININE 1.77* 1.78*  CALCIUM 8.4 8.2*   No results found for this or any previous visit (from the past 240 hour(s)).   Studies/Results: Dg Abd Portable 1v  06/15/2012   *RADIOLOGY REPORT*  Clinical Data: 67 year old male NG tube placement.  PORTABLE ABDOMEN - 1 VIEW  Comparison: 06/10/2012. Chest radiograph 06/12/2012.  Findings: Portable views of the abdomen and pelvis.  Mild motion artifact.  Tubing visible over the mediastinum, tip at the level of the gastric fundus.  Additionally EKG leads and wires. Nonobstructed bowel gas pattern. Stable visualized osseous structures.    Stable lung bases.  IMPRESSION: Enteric tube tip at the level of the gastric fundus. Nonobstructed bowel gas pattern.   Original Report Authenticated By: Erskine Speed, M.D.     Medications: Scheduled Meds: . alteplase  2 mg Intracatheter Once  . antiseptic oral rinse  1 application Mouth Rinse QID  . chlorhexidine  15 mL Mouth/Throat BID  . cloNIDine  0.3 mg Transdermal Weekly  . feeding supplement (JEVITY 1.2 CAL)  1,000 mL Per Tube Q24H  . hydrALAZINE  10 mg Intravenous Q6H  . insulin aspart  0-9 Units Subcutaneous Q4H  . lacosamide (VIMPAT) IV  100 mg Intravenous Q12H  . metoCLOPramide (REGLAN) injection  5 mg Intravenous Q6H  . metoprolol  5 mg Intravenous Q6H  . pantoprazole (PROTONIX) IV  40 mg Intravenous Q0600  . phenytoin (DILANTIN) IV  100 mg Intravenous TID  . sodium chloride  10-40 mL Intracatheter Q12H   Continuous Infusions: . dextrose 100 mL/hr at 06/14/12 1800   PRN Meds:.acetaminophen (TYLENOL) oral liquid 160 mg/5 mL, acetaminophen, chlorproMAZINE (THORAZINE) IV, labetalol, LORazepam, ondansetron, sodium chloride    LOS: 13 days   Ardra Kuznicki,CHRISTOPHER  [facility] Pager [phone]. If 8PM-8AM, please contact night-coverage at [email or web address], password [email or web address] 06/15/2012, 10:24 AM  LOS: 13 days

## 2012-06-15 NOTE — Progress Notes (Signed)
Speech Language Pathology Dysphagia Treatment Patient Details Name: Robert Mcclure MRN: [id number] DOB: November 14, 1945 Today's Date: 06/15/2012 Time: 4098-1191 SLP Time Calculation (min): 13 min  Assessment / Plan / Recommendation Clinical Impression  Pt demonstrating steady improvements in mental status today however continues to present with s/s of decreased airway protection as evidenced by severely wet vocal quality, wet inhalation and exhalations at baseline with intermittent coughing indicative of aspiration of secretions. SLP provided moderate-max verbal cueing for hard cough and dry swallow which was moderately effective to clear the airway. Oral care complete and trials of ice chips provided to assess for ability to transit a bolus. Oral phase delayed, requiring moderate verbal cues to sustain level of attention necessary to successfully transit bolus. Wet vocal quality persisted post swallow in addition to the presence of multiple dry swallows per bolus indicative of continued pharyngeal weakness. While SLP questions impact of large bore NG tube on overall pharyngeal function, mentation does not allow for further diagnostic po trials at this time. Rn confirms continuous fluctuations in level of alertness. SLP plans to f/u on Monday 3/3. Hopeful for continued improved mental status resulting in improved airway protection. If mentation improved on Monday, may wish to consider removal of NG tube to assess for any improvements in function and ability to protect airway with any consistency prior to decision made for objective evaluation.     Diet Recommendation  Continue with Current Diet: NPO    SLP Plan Continue with current plan of care   Pertinent Vitals/Pain None reported   Swallowing Goals  SLP Swallowing Goals Goal #3: Pt will participate in PO trials with sufficient attention to bolus 80% of trials. Swallow Study Goal #3 - Progress: Progressing toward goal  General Temperature  Spikes Noted: No Respiratory Status: Room air Behavior/Cognition: Lethargic;Confused;Requires cueing;Distractible;Decreased sustained attention Oral Cavity - Dentition: Edentulous Patient Positioning: Upright in bed      Dysphagia Treatment Treatment focused on: Skilled observation of diet tolerance;Upgraded PO texture trials;Patient/family/caregiver education;Utilization of compensatory strategies Treatment Methods/Modalities: Skilled observation;Differential diagnosis Patient observed directly with PO's: Yes Type of PO's observed: Ice chips Feeding: Total assist Liquids provided via: Teaspoon Oral Phase Signs & Symptoms: Prolonged oral phase Pharyngeal Phase Signs & Symptoms: Suspected delayed swallow initiation;Multiple swallows;Wet vocal quality Type of cueing: Verbal;Tactile Amount of cueing: Maximal   GO    Ferdinand Lango MA, CCC-SLP [phone]  Paulette Lynch Meryl 06/15/2012, 1:18 PM

## 2012-06-15 DEATH — deceased

## 2012-06-16 ENCOUNTER — Inpatient Hospital Stay (HOSPITAL_COMMUNITY): Payer: PRIVATE HEALTH INSURANCE

## 2012-06-16 DIAGNOSIS — I6350 Cerebral infarction due to unspecified occlusion or stenosis of unspecified cerebral artery: Secondary | ICD-10-CM

## 2012-06-16 DIAGNOSIS — J690 Pneumonitis due to inhalation of food and vomit: Secondary | ICD-10-CM

## 2012-06-16 DIAGNOSIS — G40401 Other generalized epilepsy and epileptic syndromes, not intractable, with status epilepticus: Secondary | ICD-10-CM

## 2012-06-16 DIAGNOSIS — I10 Essential (primary) hypertension: Secondary | ICD-10-CM

## 2012-06-16 DIAGNOSIS — J9600 Acute respiratory failure, unspecified whether with hypoxia or hypercapnia: Secondary | ICD-10-CM

## 2012-06-16 LAB — BLOOD GAS, ARTERIAL
Acid-base deficit: 1.5 mmol/L (ref 0.0–2.0)
Drawn by: 36496
O2 Saturation: 90.8 %
O2 Saturation: 99.4 %
PEEP: 5 cmH2O
Patient temperature: 100.5
RATE: 20 resp/min
TCO2: 23.4 mmol/L (ref 0–100)
pCO2 arterial: 36.8 mmHg (ref 35.0–45.0)
pCO2 arterial: 40.4 mmHg (ref 35.0–45.0)
pH, Arterial: 7.36 (ref 7.350–7.450)
pO2, Arterial: 218 mmHg — ABNORMAL HIGH (ref 80.0–100.0)

## 2012-06-16 LAB — BASIC METABOLIC PANEL
BUN: 25 mg/dL — ABNORMAL HIGH (ref 6–23)
Chloride: 106 mEq/L (ref 96–112)
GFR calc non Af Amer: 38 mL/min — ABNORMAL LOW (ref >90)
Glucose, Bld: 176 mg/dL — ABNORMAL HIGH (ref 70–99)
Potassium: 3.8 mEq/L (ref 3.5–5.1)

## 2012-06-16 LAB — COMPREHENSIVE METABOLIC PANEL
CO2: 21 mEq/L (ref 19–32)
Calcium: 7.7 mg/dL — ABNORMAL LOW (ref 8.4–10.5)
Creatinine, Ser: 2.89 mg/dL — ABNORMAL HIGH (ref 0.50–1.35)
GFR calc Af Amer: 24 mL/min — ABNORMAL LOW (ref >90)
GFR calc non Af Amer: 21 mL/min — ABNORMAL LOW (ref >90)
Glucose, Bld: 114 mg/dL — ABNORMAL HIGH (ref 70–99)
Total Protein: 5.9 g/dL — ABNORMAL LOW (ref 6.0–8.3)

## 2012-06-16 LAB — MAGNESIUM: Magnesium: 2 mg/dL (ref 1.5–2.5)

## 2012-06-16 LAB — URINALYSIS, ROUTINE W REFLEX MICROSCOPIC
Leukocytes, UA: NEGATIVE
Nitrite: NEGATIVE
Specific Gravity, Urine: 1.021 (ref 1.005–1.030)
Urobilinogen, UA: 0.2 mg/dL (ref 0.0–1.0)

## 2012-06-16 LAB — CBC
HCT: 28.7 % — ABNORMAL LOW (ref 39.0–52.0)
HCT: 27.4 % — ABNORMAL LOW (ref 39.0–52.0)
Hemoglobin: 9.5 g/dL — ABNORMAL LOW (ref 13.0–17.0)
Hemoglobin: 9 g/dL — ABNORMAL LOW (ref 13.0–17.0)
MCH: 31.7 pg (ref 26.0–34.0)
MCHC: 33.1 g/dL (ref 30.0–36.0)
MCHC: 32.8 g/dL (ref 30.0–36.0)
RDW: 15.4 % (ref 11.5–15.5)

## 2012-06-16 LAB — URINE MICROSCOPIC-ADD ON

## 2012-06-16 LAB — STREP PNEUMONIAE URINARY ANTIGEN: Strep Pneumo Urinary Antigen: NEGATIVE

## 2012-06-16 LAB — PRO B NATRIURETIC PEPTIDE: Pro B Natriuretic peptide (BNP): 3241 pg/mL — ABNORMAL HIGH (ref 0–125)

## 2012-06-16 LAB — GLUCOSE, CAPILLARY
Glucose-Capillary: 162 mg/dL — ABNORMAL HIGH (ref 70–99)
Glucose-Capillary: 123 mg/dL — ABNORMAL HIGH (ref 70–99)
Glucose-Capillary: 87 mg/dL (ref 70–99)

## 2012-06-16 LAB — PHENYTOIN LEVEL, TOTAL: Phenytoin Lvl: 10.7 ug/mL (ref 10.0–20.0)

## 2012-06-16 MED ORDER — SODIUM CHLORIDE 0.9 % IV SOLN
25.0000 ug/h | INTRAVENOUS | Status: DC
  Administered 2012-06-16: 100 ug/h via INTRAVENOUS
  Administered 2012-06-16 – 2012-06-17 (×2): 150 ug/h via INTRAVENOUS
  Administered 2012-06-18: 75 ug/h via INTRAVENOUS
  Administered 2012-06-19 – 2012-06-20 (×2): 150 ug/h via INTRAVENOUS
  Administered 2012-06-21 – 2012-06-22 (×3): 200 ug/h via INTRAVENOUS
  Filled 2012-06-16 (×9): qty 50

## 2012-06-16 MED ORDER — BIOTENE DRY MOUTH MT LIQD
15.0000 mL | Freq: Four times a day (QID) | OROMUCOSAL | Status: DC
  Administered 2012-06-16 – 2012-06-22 (×25): 15 mL via OROMUCOSAL

## 2012-06-16 MED ORDER — FENTANYL CITRATE 0.05 MG/ML IJ SOLN
INTRAMUSCULAR | Status: AC
  Administered 2012-06-16: 100 ug via INTRAVENOUS
  Filled 2012-06-16: qty 4

## 2012-06-16 MED ORDER — VANCOMYCIN HCL 10 G IV SOLR
1500.0000 mg | INTRAVENOUS | Status: DC
  Administered 2012-06-16 – 2012-06-17 (×2): 1500 mg via INTRAVENOUS
  Filled 2012-06-16 (×2): qty 1500

## 2012-06-16 MED ORDER — MORPHINE SULFATE 2 MG/ML IJ SOLN
INTRAMUSCULAR | Status: AC
  Filled 2012-06-16: qty 1

## 2012-06-16 MED ORDER — FENTANYL BOLUS VIA INFUSION
25.0000 ug | Freq: Four times a day (QID) | INTRAVENOUS | Status: DC | PRN
  Filled 2012-06-16: qty 100

## 2012-06-16 MED ORDER — PROPOFOL 10 MG/ML IV EMUL
5.0000 ug/kg/min | INTRAVENOUS | Status: DC
  Administered 2012-06-16: 20.23 ug/kg/min via INTRAVENOUS
  Administered 2012-06-16: 10 ug/kg/min via INTRAVENOUS
  Administered 2012-06-16 – 2012-06-17 (×2): 20 ug/kg/min via INTRAVENOUS
  Administered 2012-06-17: 20.23 ug/kg/min via INTRAVENOUS
  Administered 2012-06-18: 30 ug/kg/min via INTRAVENOUS
  Administered 2012-06-18: 20 ug/kg/min via INTRAVENOUS
  Administered 2012-06-18: 15.219 ug/kg/min via INTRAVENOUS
  Administered 2012-06-18: 15 ug/kg/min via INTRAVENOUS
  Filled 2012-06-16 (×11): qty 100

## 2012-06-16 MED ORDER — FENTANYL CITRATE 0.05 MG/ML IJ SOLN: 100.0000 ug | Freq: Once | INTRAMUSCULAR | Status: AC

## 2012-06-16 MED ORDER — SODIUM CHLORIDE 0.9 % IV SOLN
INTRAVENOUS | Status: DC
  Administered 2012-06-16 – 2012-06-17 (×2): via INTRAVENOUS
  Administered 2012-06-18: 50 mL/h via INTRAVENOUS
  Administered 2012-06-19 – 2012-06-21 (×3): via INTRAVENOUS

## 2012-06-16 MED ORDER — ALBUTEROL SULFATE HFA 108 (90 BASE) MCG/ACT IN AERS
8.0000 | INHALATION_SPRAY | RESPIRATORY_TRACT | Status: DC
Start: 2012-06-16 — End: 2012-06-22
  Administered 2012-06-16 – 2012-06-22 (×38): 8 via RESPIRATORY_TRACT
  Filled 2012-06-16 (×4): qty 6.7

## 2012-06-16 MED ORDER — PANTOPRAZOLE SODIUM 40 MG IV SOLR
40.0000 mg | Freq: Two times a day (BID) | INTRAVENOUS | Status: DC
  Administered 2012-06-16 – 2012-06-22 (×14): 40 mg via INTRAVENOUS
  Filled 2012-06-16 (×16): qty 40

## 2012-06-16 MED ORDER — MIDAZOLAM HCL 2 MG/2ML IJ SOLN: 4.0000 mg | Freq: Once | INTRAMUSCULAR | Status: AC

## 2012-06-16 MED ORDER — ETOMIDATE 2 MG/ML IV SOLN
20.0000 mg/kg | Freq: Once | INTRAVENOUS | Status: DC
  Administered 2012-06-16: 20 mg via INTRAVENOUS

## 2012-06-16 MED ORDER — SUCCINYLCHOLINE CHLORIDE 20 MG/ML IJ SOLN
100.0000 mg | Freq: Once | INTRAMUSCULAR | Status: AC
  Administered 2012-06-16: 100 mg via INTRAVENOUS

## 2012-06-16 MED ORDER — ETOMIDATE 2 MG/ML IV SOLN
20.0000 mg | Freq: Once | INTRAVENOUS | Status: AC
  Administered 2012-06-16: 20 mg via INTRAVENOUS
  Filled 2012-06-16: qty 10

## 2012-06-16 MED ORDER — CHLORHEXIDINE GLUCONATE 0.12 % MT SOLN
15.0000 mL | Freq: Two times a day (BID) | OROMUCOSAL | Status: DC
  Administered 2012-06-16 – 2012-06-22 (×13): 15 mL via OROMUCOSAL
  Filled 2012-06-16 (×13): qty 15

## 2012-06-16 MED ORDER — MIDAZOLAM HCL 2 MG/2ML IJ SOLN
INTRAMUSCULAR | Status: AC
  Administered 2012-06-16: 4 mg via INTRAVENOUS
  Filled 2012-06-16: qty 4

## 2012-06-16 MED ORDER — IPRATROPIUM BROMIDE HFA 17 MCG/ACT IN AERS
6.0000 | INHALATION_SPRAY | RESPIRATORY_TRACT | Status: DC
  Administered 2012-06-16 – 2012-06-22 (×38): 6 via RESPIRATORY_TRACT
  Filled 2012-06-16: qty 12.9

## 2012-06-16 MED ORDER — PIPERACILLIN-TAZOBACTAM 3.375 G IVPB
3.3750 g | Freq: Three times a day (TID) | INTRAVENOUS | Status: DC
  Administered 2012-06-16 – 2012-06-18 (×6): 3.375 g via INTRAVENOUS
  Filled 2012-06-16 (×8): qty 50

## 2012-06-16 NOTE — Procedures (Signed)
Intubation Procedure Note WILMER SANTILLO [id number] 1945/07/05  Procedure: Intubation Indications: Respiratory insufficiency  Procedure Details Consent: Unable to obtain consent because of altered level of consciousness. Time Out: Verified patient identification, verified procedure, site/side was marked, verified correct patient position, special equipment/implants available, medications/allergies/relevent history reviewed, required imaging and test results available.  Performed  Maximum sterile technique was used including gloves, hand hygiene and mask.  MAC 4    Evaluation Hemodynamic Status: BP stable throughout; O2 sats: stable throughout Patient's Current Condition: stable Complications: No apparent complications Patient did tolerate procedure well. Chest X-ray ordered to verify placement.  CXR: tube position low-repostitioned.   Sandi Carne K. 06/16/2012

## 2012-06-16 NOTE — Progress Notes (Signed)
ANTIBIOTIC CONSULT NOTE - INITIAL  Pharmacy Consult for Vancomycin/Zosyn Indication: rule out pneumonia  No Known Allergies  Patient Measurements: Height: 6' (182.9 cm) Weight: 199 lb 15.3 oz (90.7 kg) IBW/kg (Calculated) : 77.6  Vital Signs: Temp: 100.4 F (38 C) (03/02 0830) Temp src: Core (Comment) (03/02 0800) BP: 99/77 mmHg (03/02 0830) Pulse Rate: 117 (03/02 0830) Intake/Output from previous day: 03/01 0701 - 03/02 0700 In: 1738.2 [I.V.:1678.2; NG/GT:60] Out: 1255 [Urine:955; Emesis/NG output:300] Intake/Output from this shift: Total I/O In: 90 [I.V.:60; NG/GT:30] Out: 111 [Urine:11; Emesis/NG output:100]  Labs:  Recent Labs  06/14/12 0432 06/15/12 0500 06/16/12 0255  WBC 7.4 8.4 5.9  HGB 9.8* 9.5* 9.5*  PLT 214 205 242  CREATININE 1.77* 1.78* 1.78*   Estimated Creatinine Clearance: 44.2 ml/min (by C-G formula based on Cr of 1.78). No results found for this basename: VANCOTROUGH, Leodis Binet, VANCORANDOM, GENTTROUGH, GENTPEAK, GENTRANDOM, TOBRATROUGH, TOBRAPEAK, TOBRARND, AMIKACINPEAK, AMIKACINTROU, AMIKACIN,  in the last 72 hours   Microbiology: Recent Results (from the past 720 hour(s))  URINE CULTURE     Status: None   Collection Time    06/14/2012 12:11 PM      Result Value Range Status   Specimen Description URINE, CATHETERIZED   Final   Special Requests NONE   Final   Culture  Setup Time 05/28/2012 20:14   Final   Colony Count NO GROWTH   Final   Culture NO GROWTH   Final   Report Status 06/03/2012 FINAL   Final  URINE CULTURE     Status: None   Collection Time    05/19/2012  5:45 PM      Result Value Range Status   Specimen Description URINE, CATHETERIZED   Final   Special Requests NONE   Final   Culture  Setup Time 06/03/2012 02:01   Final   Colony Count NO GROWTH   Final   Culture NO GROWTH   Final   Report Status 06/04/2012 FINAL   Final  MRSA PCR SCREENING     Status: None   Collection Time    06/04/2012  6:13 PM      Result Value Range  Status   MRSA by PCR NEGATIVE  NEGATIVE Final   Comment:            The GeneXpert MRSA Assay (FDA     approved for NASAL specimens     only), is one component of a     comprehensive MRSA colonization     surveillance program. It is not     intended to diagnose MRSA     infection nor to guide or     monitor treatment for     MRSA infections.  CULTURE, BLOOD (ROUTINE X 2)     Status: None   Collection Time    06/03/12  1:37 AM      Result Value Range Status   Specimen Description BLOOD RIGHT RADIAL ARTERIAL   Final   Special Requests BOTTLES DRAWN AEROBIC AND ANAEROBIC 5CC EA   Final   Culture  Setup Time 06/03/2012 08:25   Final   Culture NO GROWTH 5 DAYS   Final   Report Status 06/09/2012 FINAL   Final  CULTURE, BLOOD (ROUTINE X 2)     Status: None   Collection Time    06/03/12  1:50 AM      Result Value Range Status   Specimen Description BLOOD LEFT ARM   Final   Special Requests BOTTLES DRAWN AEROBIC  ONLY 1CC   Final   Culture  Setup Time 06/03/2012 08:25   Final   Culture NO GROWTH 5 DAYS   Final   Report Status 06/09/2012 FINAL   Final  CULTURE, RESPIRATORY (NON-EXPECTORATED)     Status: None   Collection Time    06/03/12  3:51 AM      Result Value Range Status   Specimen Description TRACHEAL ASPIRATE   Final   Special Requests Normal   Final   Gram Stain     Final   Value: ABUNDANT WBC PRESENT, PREDOMINANTLY PMN     RARE SQUAMOUS EPITHELIAL CELLS PRESENT     MODERATE GRAM POSITIVE COCCI IN PAIRS     RARE GRAM POSITIVE RODS   Culture Non-Pathogenic Oropharyngeal-type Flora Isolated.   Final   Report Status 06/05/2012 FINAL   Final  STOOL CULTURE     Status: None   Collection Time    06/04/12  6:47 PM      Result Value Range Status   Specimen Description STOOL   Final   Special Requests NONE   Final   Culture     Final   Value: NO SALMONELLA, SHIGELLA, CAMPYLOBACTER, YERSINIA, OR E.COLI 0157:H7 ISOLATED   Report Status 06/08/2012 FINAL   Final    Medical  History: Past Medical History  Diagnosis Date  . Hypertension   . Diabetes mellitus   . CVA (cerebral vascular accident)   . Stroke     Medications:  Scheduled:  . albuterol  8 puff Inhalation Q4H  . [COMPLETED] alteplase  2 mg Intracatheter Once  . antiseptic oral rinse  1 application Mouth Rinse QID  . antiseptic oral rinse  15 mL Mouth Rinse QID  . chlorhexidine  15 mL Mouth Rinse BID  . cloNIDine  0.3 mg Transdermal Weekly  . [COMPLETED] etomidate  20 mg Intravenous Once  . feeding supplement (JEVITY 1.2 CAL)  1,000 mL Per Tube Q24H  . [COMPLETED] fentaNYL  100 mcg Intravenous Once  . hydrALAZINE  20 mg Intravenous Q6H  . insulin aspart  0-9 Units Subcutaneous Q4H  . ipratropium  6 puff Inhalation Q4H  . lacosamide (VIMPAT) IV  100 mg Intravenous Q12H  . metoCLOPramide (REGLAN) injection  5 mg Intravenous Q6H  . metoprolol  5 mg Intravenous Q6H  . [COMPLETED] midazolam  4 mg Intravenous Once  . morphine      . pantoprazole (PROTONIX) IV  40 mg Intravenous Q12H  . phenytoin (DILANTIN) IV  100 mg Intravenous TID  . sodium chloride  10-40 mL Intracatheter Q12H  . [COMPLETED] succinylcholine  100 mg Intravenous Once  . [DISCONTINUED] chlorhexidine  15 mL Mouth/Throat BID  . [COMPLETED] etomidate  20 mg/kg Intravenous Once  . [DISCONTINUED] feeding supplement (JEVITY 1.2 CAL)  1,000 mL Per Tube Q24H  . [DISCONTINUED] hydrALAZINE  10 mg Intravenous Q6H  . [DISCONTINUED] pantoprazole (PROTONIX) IV  40 mg Intravenous Q0600   Infusions:  . fentaNYL infusion INTRAVENOUS Stopped (06/16/12 0800)  . propofol Stopped (06/16/12 0600)  . [DISCONTINUED] dextrose 50 mL/hr at 06/16/12 0800   Assessment: 67 y/o male patient admitted with acute respiratory failure s/p aspiration event requiring broad spectrum antibiotics for r/o pneumonia. Noted acute on chronic renal failure, will adjust antibiotics for dysfunction.  Goal of Therapy:  Vancomycin trough level 15-20 mcg/ml  Plan:   Vancomycin 1500mg  IV q24h, zosyn 3.375g IV q8h and monitor renal function. Measure antibiotic drug levels at steady state Follow up culture results  Verlene Mayer, PharmD, BCPS Pager [phone] 06/16/2012,9:24 AM

## 2012-06-16 NOTE — Consult Note (Signed)
Name: Robert Mcclure MRN: [id number] DOB: [birth year]    LOS: 14  Referring Provider:  [name] NP Reason for Referral:  Hypoxic respiratory failure  PULMONARY / CRITICAL CARE MEDICINE  HPI:  Robert Mcclure is a 67 y/o man with history of CVA, seizure d/o and HTN who was admitted on June 08, 2012 to the ICU on the PCCM service in status epilepticus (required intubation for airway protection).  He was extubated on 2/19 and transferred to the hospitalist service on 2/21.  He has had trouble with blood pressure management since transfer.  He has also required an NG tube for medications and feeding.  Early in the morning on 3/2 (around 1:30 am).  He vomited what appeared to be coffee grounds.  He then developed respiratory distress requiring a non-rebreather to maintain O2 sats greater than 90%, tachypnea and increased work of breathing.  An ABG done at that time showed PaO2 of 60 on the non-rebreather.  PCCM was contacted.  Robert Mcclure appeared to be in significant distress.  He was intubated and transferred to the MICU.   Past Medical History  Diagnosis Date  . Hypertension   . Diabetes mellitus   . CVA (cerebral vascular accident)   . Stroke    History reviewed. No pertinent past surgical history. Prior to Admission medications   Medication Sig Start Date End Date Taking? Authorizing Provider  allopurinol (ZYLOPRIM) 100 MG tablet Take 100 mg by mouth 3 (three) times a week. Take on Monday, Wednesday, Friday   Yes Historical Provider, MD  amLODipine (NORVASC) 10 MG tablet Take 10 mg by mouth daily.   Yes Historical Provider, MD  aspirin 325 MG tablet Take 325 mg by mouth daily.   Yes Historical Provider, MD  cloNIDine (CATAPRES) 0.3 MG tablet Take 0.3 mg by mouth every 8 (eight) hours.   Yes Historical Provider, MD  donepezil (ARICEPT) 10 MG tablet Take 10 mg by mouth daily.   Yes Historical Provider, MD  furosemide (LASIX) 20 MG tablet Take 20 mg by mouth every Monday, Wednesday, and  Friday.   Yes Historical Provider, MD  insulin glargine (LANTUS) 100 UNIT/ML injection Inject 30 Units into the skin at bedtime. 01/27/12  Yes Sorin Luanne Bras, MD  levETIRAcetam (KEPPRA) 500 MG tablet Take 1 tablet (500 mg total) by mouth 2 (two) times daily. 01/27/12  Yes Sorin Luanne Bras, MD  losartan (COZAAR) 100 MG tablet Take 0.5 tablets (50 mg total) by mouth daily. 01/27/12  Yes Sorin Luanne Bras, MD  metFORMIN (GLUCOPHAGE) 500 MG tablet Take 500 mg by mouth 2 (two) times daily with a meal.   Yes Historical Provider, MD  metoprolol (LOPRESSOR) 100 MG tablet Take 100 mg by mouth 2 (two) times daily.   Yes Historical Provider, MD  ondansetron (ZOFRAN) 4 MG tablet Take 1 tablet (4 mg total) by mouth every 8 (eight) hours as needed for nausea. 06/01/12  Yes Roxy Horseman, PA-C  pioglitazone (ACTOS) 30 MG tablet Take 30 mg by mouth daily.   Yes Historical Provider, MD  simvastatin (ZOCOR) 20 MG tablet Take 20 mg by mouth every evening.   Yes Historical Provider, MD   Allergies No Known Allergies  Family History History reviewed. No pertinent family history. Social History  reports that he has never smoked. He does not have any smokeless tobacco history on file. He reports that he does not drink alcohol or use illicit drugs.  Review Of Systems:  Could not be obtained as patient is intubated and  sedated  Brief patient description:  67 y/o with CVA, seizure d/o and HTN, intubated for respiratory failure following witnessed aspiration event.  Events Since Admission: Admitted first for status epilepticus, intubated for airway protection, extubated, transferred to step down, reintubated on 3/2 after aspiration event  Current Status: guarded  Vital Signs: Temp:  [98.2 F (36.8 C)-101.1 F (38.4 C)] 101.1 F (38.4 C) (03/01 2314) Pulse Rate:  [110-135] 111 (03/02 0237) Resp:  [14-42] 29 (03/02 0232) BP: (128-224)/(68-112) 128/68 mmHg (03/02 0237) SpO2:  [92 %-99 %] 97 % (03/02 0237) FiO2 (%):   [100 %] 100 % (03/02 0237) Weight:  [89.8 kg (197 lb 15.6 oz)-90.7 kg (199 lb 15.3 oz)] 90.7 kg (199 lb 15.3 oz) (03/02 0236)  Physical Examination: General:  Elderly man, laying in bed, tachypneic, hypertensive, increased work of breathing Neuro:  PERRL, EOMI HEENT:  OP clear, NG tube in place Neck:  Supple, no masses Cardiovascular:  NRRR, no mrg Lungs:  Decreased air movement bilaterally Abdomen:  Soft, NTND, +BS Musculoskeletal:  No joint abnormalities Skin:  No rashes   Principal Problem:   Acute respiratory failure Active Problems:   HTN (hypertension), malignant   CVA (cerebral vascular accident)   Diabetes mellitus   Aspiration pneumonia   Encephalopathy, metabolic   Dysphagia, unspecified   Altered mental status   Weakness generalized   ASSESSMENT AND PLAN  PULMONARY  Recent Labs Lab 06/16/12 0135  PHART 7.405  PCO2ART 36.8  PO2ART 60.4*  HCO3 22.3  O2SAT 90.8   Ventilator Settings: Vent Mode:  [-] PRVC FiO2 (%):  [100 %] 100 % Set Rate:  [20 bmp] 20 bmp Vt Set:  [409 mL] 620 mL PEEP:  [5 cmH20] 5 cmH20 CXR:  Bilateral diffuse, interstitial infiltrates, denser infiltrate in RUL, ETT at carina, will retract 2cm ETT:  7.5, 21cm at the teeth, placed 06/16/12 (after retracting in response to CXR)  A:  Aspiration pneumonitis P:   Witnessed aspiration event with immediate respiratory failure Will hold on antibiotics for now, if develops persistent infiltrate or fever will start coverage for aspiration pneumonia, anaerobes less concerning as pt is edentulous   CARDIOVASCULAR No results found for this basename: TROPONINI, LATICACIDVEN,  O2SATVEN, PROBNP,  in the last 168 hours ECG:  Sinus tachycardia Lines: left picc line  A: HTN P:  Continue antihypertensives, may need to decrease while sedated.   RENAL  Recent Labs Lab 06/11/12 0540 06/12/12 0141 06/13/12 0500 06/13/12 1627 06/14/12 0432 06/15/12 0500  NA 152* 150* 148*  --  146* 144  K 3.8  3.7 3.4*  --  3.6 3.7  CL 118* 119* 113*  --  113* 111  CO2 20 21 24   --  22 24  BUN 23 27* 34*  --  29* 26*  CREATININE 1.43* 1.67* 2.07*  --  1.77* 1.78*  CALCIUM 8.4 8.2* 8.5  --  8.4 8.2*  MG  --   --   --  2.1 2.2  --    Intake/Output     03/01 0701 - 03/02 0700   I.V. (mL/kg) 1115.8 (12.3)   NG/GT 30   Total Intake(mL/kg) 1145.8 (12.6)   Urine (mL/kg/hr) 625 (0.3)   Total Output 625   Net +520.8        Foley:  In place  A:  CKD P:   Cr currently 1.78 Was getting D5W for hypernatremia - repeat BMP on admission Will try to manage hypernatremia with free water flushes through NG tube  GASTROINTESTINAL  Recent Labs Lab 06/13/12 0500 06/15/12 0500  AST 24 30  ALT 30 33  ALKPHOS 66 87  BILITOT 0.4 0.5  PROT 5.7* 6.0  ALBUMIN 2.4* 2.6*    A:  Failed modified barium swallow P:   Speech follow, had been NPO with some PO trials Will restart tube feeds when NG output decreases. Per palliative care notes family would be willing to consider PEG placement.   HEMATOLOGIC  Recent Labs Lab 06/11/12 0540 06/12/12 0141 06/13/12 0500 06/14/12 0432 06/15/12 0500  HGB 10.8* 10.2* 9.5* 9.8* 9.5*  HCT 33.1* 31.1* 28.9* 29.5* 29.0*  PLT 232 169 207 214 205   A:  Anemia P:  Likely iatrogenic - will try to limit blood draws  INFECTIOUS  Recent Labs Lab 06/11/12 0540 06/12/12 0141 06/13/12 0500 06/14/12 0432 06/15/12 0500  WBC 6.0 4.6 5.5 7.4 8.4   Cultures: none Antibiotics: none  A:  Monitor for signs of developing pneumonia P:   Pt febrile immediately after aspiration event - likely due to inflammatory response to aspiration event If continues to have fever, or develops persistent infiltrate will obtain trach culture and start abx to cover aspiration pneumonia  ENDOCRINE  Recent Labs Lab 06/15/12 1129 06/15/12 1725 06/15/12 2052 06/15/12 2313 06/16/12 0230  GLUCAP 179* 208* 244* 229* 162*   A:  DM   P:   q4h accuchecks and  ssi  NEUROLOGIC  A:  S/p CVA, seizure d/o, dysphagia P:   Continue antiepileptics Sedation with propofol and fentanyl  BEST PRACTICE / DISPOSITION Level of Care:  ICU Primary Service:  PCCM Consultants:  Palliative care, neurology Code Status:  full Diet:  Tube feeds DVT Px:  heparin GI Px:  PPI Skin Integrity:  good Social / Family:  Not currently in hospital, will update by phone.   Carolan Clines., M.D. Pulmonary and Critical Care Medicine [facility] Pager: [phone]  I spent 45 minutes of critical care time in the care of this patient separate from procedures which are documented elsewhere.   06/16/2012, 2:45 AM

## 2012-06-16 NOTE — Progress Notes (Signed)
MEDICATION RELATED CONSULT NOTE - FOLLOW UP  Pharmacy Consult for Phenytoin Indication: Seizures  No Known Allergies  Patient Measurements: Height: 6' (182.9 cm) Weight: 199 lb 15.3 oz (90.7 kg) IBW/kg (Calculated) : 77.6  Vital Signs: Temp: 101.1 F (38.4 C) (03/02 1000) Temp src: Core (Comment) (03/02 1000) BP: 113/62 mmHg (03/02 1000) Pulse Rate: 120 (03/02 1000) Intake/Output from previous day: 03/01 0701 - 03/02 0700 In: 1738.2 [I.V.:1678.2; NG/GT:60] Out: 1255 [Urine:955; Emesis/NG output:300] Intake/Output from this shift: Total I/O In: 210 [I.V.:75; NG/GT:30; IV Piggyback:105] Out: 123 [Urine:23; Emesis/NG output:100]  Labs:  Recent Labs  06/13/12 1627 06/14/12 0432 06/15/12 0500 06/16/12 0255  WBC  --  7.4 8.4 5.9  HGB  --  9.8* 9.5* 9.5*  HCT  --  29.5* 29.0* 28.7*  PLT  --  214 205 242  CREATININE  --  1.77* 1.78* 1.78*  MG 2.1 2.2  --   --   ALBUMIN  --   --  2.6*  --   PROT  --   --  6.0  --   AST  --   --  30  --   ALT  --   --  33  --   ALKPHOS  --   --  87  --   BILITOT  --   --  0.5  --    Estimated Creatinine Clearance: 44.2 ml/min (by C-G formula based on Cr of 1.78).   Microbiology: Recent Results (from the past 720 hour(s))  URINE CULTURE     Status: None   Collection Time    June 30, 2012 12:11 PM      Result Value Range Status   Specimen Description URINE, CATHETERIZED   Final   Special Requests NONE   Final   Culture  Setup Time 06/30/12 20:14   Final   Colony Count NO GROWTH   Final   Culture NO GROWTH   Final   Report Status 06/03/2012 FINAL   Final  URINE CULTURE     Status: None   Collection Time    06/30/12  5:45 PM      Result Value Range Status   Specimen Description URINE, CATHETERIZED   Final   Special Requests NONE   Final   Culture  Setup Time 06/03/2012 02:01   Final   Colony Count NO GROWTH   Final   Culture NO GROWTH   Final   Report Status 06/04/2012 FINAL   Final  MRSA PCR SCREENING     Status: None   Collection Time    06-30-2012  6:13 PM      Result Value Range Status   MRSA by PCR NEGATIVE  NEGATIVE Final   Comment:            The GeneXpert MRSA Assay (FDA     approved for NASAL specimens     only), is one component of a     comprehensive MRSA colonization     surveillance program. It is not     intended to diagnose MRSA     infection nor to guide or     monitor treatment for     MRSA infections.  CULTURE, BLOOD (ROUTINE X 2)     Status: None   Collection Time    06/03/12  1:37 AM      Result Value Range Status   Specimen Description BLOOD RIGHT RADIAL ARTERIAL   Final   Special Requests BOTTLES DRAWN AEROBIC AND ANAEROBIC 5CC EA  Final   Culture  Setup Time 06/03/2012 08:25   Final   Culture NO GROWTH 5 DAYS   Final   Report Status 06/09/2012 FINAL   Final  CULTURE, BLOOD (ROUTINE X 2)     Status: None   Collection Time    06/03/12  1:50 AM      Result Value Range Status   Specimen Description BLOOD LEFT ARM   Final   Special Requests BOTTLES DRAWN AEROBIC ONLY 1CC   Final   Culture  Setup Time 06/03/2012 08:25   Final   Culture NO GROWTH 5 DAYS   Final   Report Status 06/09/2012 FINAL   Final  CULTURE, RESPIRATORY (NON-EXPECTORATED)     Status: None   Collection Time    06/03/12  3:51 AM      Result Value Range Status   Specimen Description TRACHEAL ASPIRATE   Final   Special Requests Normal   Final   Gram Stain     Final   Value: ABUNDANT WBC PRESENT, PREDOMINANTLY PMN     RARE SQUAMOUS EPITHELIAL CELLS PRESENT     MODERATE GRAM POSITIVE COCCI IN PAIRS     RARE GRAM POSITIVE RODS   Culture Non-Pathogenic Oropharyngeal-type Flora Isolated.   Final   Report Status 06/05/2012 FINAL   Final  STOOL CULTURE     Status: None   Collection Time    06/04/12  6:47 PM      Result Value Range Status   Specimen Description STOOL   Final   Special Requests NONE   Final   Culture     Final   Value: NO SALMONELLA, SHIGELLA, CAMPYLOBACTER, YERSINIA, OR E.COLI 0157:H7  ISOLATED   Report Status 06/08/2012 FINAL   Final    Assessment: 67 y.o. M admitted with status epilepticus on 2/16 -- managed on Keppra initially and then transitioned to phenytoin on 2/26 after witnessed seizure activity on 2/24. A phenytoin level this morning corrects to 17.3 mcg/ml and is within the goal range -- however this level is not truly reflective of steady state concentrations. Will continue current dosing and will re-check another phenytoin level/albumin in 2-3 days.   FosDPH LD of ~1800 mg on 2/26 DPH 100 mg/8h (2/27 >> current) * 2/27 (1630): Corr DPH level 28 (still reflected LD) * 3/2 (0930): Corr DPH level: 17.3 (at goal, but not at steady state)  Goal of Therapy:  Total Phenytoin level of 10-20 mcg/ml (corrected by albumin and renal function)  Plan:  1. Continue Phenytoin 100 mg IV every 8 hours 2. Will follow-up with a repeat phenytoin level and albumin on 3/4 3. Will follow-up plans to transition to oral once stable 4. Will continue to monitor for any signs/symptoms of seizure activity.   Georgina Pillion, PharmD, BCPS Clinical Pharmacist Pager: [phone] 06/16/2012 11:16 AM

## 2012-06-16 NOTE — Progress Notes (Signed)
Subjective: Interval History: vomited 300 cc coffee ground emesis. Became tachycardia, tachypneic. Increase in oxygen requirements.  Objective: Vital signs in last 24 hours: Temp:  [98.2 F (36.8 C)-101.1 F (38.4 C)] 101.1 F (38.4 C) (03/01 2314) Pulse Rate:  [110-135] 133 (03/02 0145) Resp:  [14-42] 42 (03/02 0145) BP: (160-224)/(74-112) 224/112 mmHg (03/02 0145) SpO2:  [92 %-98 %] 96 % (03/02 0145) Weight:  [89.8 kg (197 lb 15.6 oz)] 89.8 kg (197 lb 15.6 oz) (03/01 0500)  Intake/Output from previous day: 03/01 0701 - 03/02 0700 In: 1145.8 [I.V.:1115.8] Out: 625 [Urine:625] Intake/Output this shift: Total I/O In: 515.8 [I.V.:515.8] Out: 250 [Urine:250]  BP 224/112  Pulse 133  Temp(Src) 101.1 F (38.4 C) (Oral)  Resp 42  Ht 6' (1.829 m)  Wt 89.8 kg (197 lb 15.6 oz)  BMI 26.84 kg/m2  SpO2 96% General appearance: severe distress Lungs: rhonchi bilaterally Heart: tachycardic - 130s  Results for orders placed during the hospital encounter of 2012/06/17 (from the past 24 hour(s))  GLUCOSE, CAPILLARY     Status: Abnormal   Collection Time    06/15/12  4:36 AM      Result Value Range   Glucose-Capillary 157 (*) 70 - 99 mg/dL  CBC     Status: Abnormal   Collection Time    06/15/12  5:00 AM      Result Value Range   WBC 8.4  4.0 - 10.5 K/uL   RBC 3.01 (*) 4.22 - 5.81 MIL/uL   Hemoglobin 9.5 (*) 13.0 - 17.0 g/dL   HCT 16.1 (*) 09.6 - 04.5 %   MCV 96.3  78.0 - 100.0 fL   MCH 31.6  26.0 - 34.0 pg   MCHC 32.8  30.0 - 36.0 g/dL   RDW 40.9  81.1 - 91.4 %   Platelets 205  150 - 400 K/uL  COMPREHENSIVE METABOLIC PANEL     Status: Abnormal   Collection Time    06/15/12  5:00 AM      Result Value Range   Sodium 144  135 - 145 mEq/L   Potassium 3.7  3.5 - 5.1 mEq/L   Chloride 111  96 - 112 mEq/L   CO2 24  19 - 32 mEq/L   Glucose, Bld 180 (*) 70 - 99 mg/dL   BUN 26 (*) 6 - 23 mg/dL   Creatinine, Ser 7.82 (*) 0.50 - 1.35 mg/dL   Calcium 8.2 (*) 8.4 - 10.5 mg/dL   Total  Protein 6.0  6.0 - 8.3 g/dL   Albumin 2.6 (*) 3.5 - 5.2 g/dL   AST 30  0 - 37 U/L   ALT 33  0 - 53 U/L   Alkaline Phosphatase 87  39 - 117 U/L   Total Bilirubin 0.5  0.3 - 1.2 mg/dL   GFR calc non Af Amer 38 (*) >90 mL/min   GFR calc Af Amer 44 (*) >90 mL/min  GLUCOSE, CAPILLARY     Status: Abnormal   Collection Time    06/15/12  8:16 AM      Result Value Range   Glucose-Capillary 151 (*) 70 - 99 mg/dL   Comment 1 Documented in Chart     Comment 2 Notify RN    GLUCOSE, CAPILLARY     Status: Abnormal   Collection Time    06/15/12 11:29 AM      Result Value Range   Glucose-Capillary 179 (*) 70 - 99 mg/dL   Comment 1 Documented in Chart  Comment 2 Notify RN    GLUCOSE, CAPILLARY     Status: Abnormal   Collection Time    06/15/12  5:25 PM      Result Value Range   Glucose-Capillary 208 (*) 70 - 99 mg/dL   Comment 1 Notify RN    GLUCOSE, CAPILLARY     Status: Abnormal   Collection Time    06/15/12  8:52 PM      Result Value Range   Glucose-Capillary 244 (*) 70 - 99 mg/dL  GLUCOSE, CAPILLARY     Status: Abnormal   Collection Time    06/15/12 11:13 PM      Result Value Range   Glucose-Capillary 229 (*) 70 - 99 mg/dL   Comment 1 Notify RN    BLOOD GAS, ARTERIAL     Status: Abnormal   Collection Time    06/16/12  1:35 AM      Result Value Range   FIO2 1.00     Delivery systems OXYGEN MASK     pH, Arterial 7.405  7.350 - 7.450   pCO2 arterial 36.8  35.0 - 45.0 mmHg   pO2, Arterial 60.4 (*) 80.0 - 100.0 mmHg   Bicarbonate 22.3  20.0 - 24.0 mEq/L   TCO2 23.4  0 - 100 mmol/L   Acid-base deficit 1.5  0.0 - 2.0 mmol/L   O2 Saturation 90.8     Patient temperature 100.5     Collection site RIGHT RADIAL     Drawn by [id number]     Sample type ARTERIAL DRAW     Allens test (pass/fail) PASS  PASS    Studies/Results:     Scheduled Meds: . antiseptic oral rinse  1 application Mouth Rinse QID  . chlorhexidine  15 mL Mouth/Throat BID  . cloNIDine  0.3 mg Transdermal Weekly   . feeding supplement (JEVITY 1.2 CAL)  1,000 mL Per Tube Q24H  . hydrALAZINE  20 mg Intravenous Q6H  . insulin aspart  0-9 Units Subcutaneous Q4H  . lacosamide (VIMPAT) IV  100 mg Intravenous Q12H  . metoCLOPramide (REGLAN) injection  5 mg Intravenous Q6H  . metoprolol  5 mg Intravenous Q6H  . pantoprazole (PROTONIX) IV  40 mg Intravenous Q0600  . phenytoin (DILANTIN) IV  100 mg Intravenous TID  . sodium chloride  10-40 mL Intracatheter Q12H   Continuous Infusions: . dextrose 50 mL/hr at 06/15/12 2200   PRN Meds:acetaminophen (TYLENOL) oral liquid 160 mg/5 mL, acetaminophen, chlorproMAZINE (THORAZINE) IV, labetalol, LORazepam, ondansetron, sodium chloride  Assessment/Plan: Respiratory distress: likely due to aspiration. Contacted Elink - patient evaluated and intubated - transfer to ICU  GI bleed: vomited 300 cc coffee ground emesis - re-evaluate CBC.   LOS: 14 days   Robert Mcclure, Robert A.

## 2012-06-16 NOTE — Progress Notes (Signed)
eLink Physician-Brief Progress Note Patient Name: Robert Mcclure DOB: 1946-03-16 MRN: [id number]  Date of Service  06/16/2012   HPI/Events of Note  Call from bedside nurse that patient vomited 300 ml of coffee ground emesis.  No current h/o of GIB and has not been seen recently by GI.  HD stable.  Last Hgb was 9.5.   eICU Interventions  Plan: Check CBC Discuss with nurse contacting primary team re: event.   Intervention Category Intermediate Interventions: Bleeding - evaluation and treatment with blood products  DETERDING,ELIZABETH 06/16/2012, 1:44 AM

## 2012-06-16 NOTE — Progress Notes (Signed)
Fentanyl wasted; witnessed by Oscar La RN. Renette Butters, Viona Gilmore

## 2012-06-16 NOTE — Progress Notes (Addendum)
Name: Robert Mcclure MRN: [id number] DOB: 1945/08/11    LOS: 14  Referring Provider:  [name] NP Reason for Referral:  Hypoxic respiratory failure  PULMONARY / CRITICAL CARE MEDICINE    Brief patient description:  67 y/o with CVA, seizure d/o and HTN, intubated for respiratory failure following witnessed aspiration event. He was admitted on 06/05/2012 to the ICU on the PCCM service in status epilepticus (required intubation for airway protection). He was extubated on 2/19 and transferred to the hospitalist service on 2/21. He has had trouble with blood pressure management since transfer. He has also required an NG tube for medications and feeding. Early in the morning on 3/2 (around 1:30 am). He vomited what appeared to be coffee grounds. He then developed respiratory distress    Events Since Admission: Admitted first for status epilepticus, intubated for airway protection, extubated, transferred to step down, reintubated on 3/2 after aspiration event    Vital Signs: Temp:  [98.8 F (37.1 C)-104.1 F (40.1 C)] 100.8 F (38.2 C) (03/02 0745) Pulse Rate:  [109-135] 114 (03/02 0745) Resp:  [15-42] 25 (03/02 0745) BP: (110-224)/(64-112) 118/77 mmHg (03/02 0745) SpO2:  [92 %-100 %] 99 % (03/02 0745) FiO2 (%):  [40 %-100 %] 40 % (03/02 0745) Weight:  [90.7 kg (199 lb 15.3 oz)] 90.7 kg (199 lb 15.3 oz) (03/02 0236)  Physical Examination: General:  Elderly man,sedated on vent Neuro:  Sedated on vent HEENT:  OP clear, NG tube with coffee grounds Neck:  Supple, no masses Cardiovascular:  NRRR, no mrg Lungs:  Decreased air movement bilaterally, copious secretions Abdomen:  Soft, NTND, +BS Musculoskeletal:  No joint abnormalities Skin:  No rashes   Principal Problem:   Acute respiratory failure Active Problems:   HTN (hypertension), malignant   CVA (cerebral vascular accident)   Diabetes mellitus   Aspiration pneumonia   Encephalopathy, metabolic   Dysphagia,  unspecified   Altered mental status   Weakness generalized   ASSESSMENT AND PLAN  PULMONARY  Recent Labs Lab 06/16/12 0135 06/16/12 0229  PHART 7.405 7.360  PCO2ART 36.8 40.4  PO2ART 60.4* 218.0*  HCO3 22.3 21.7  O2SAT 90.8 99.4   Ventilator Settings: Vent Mode:  [-] PRVC FiO2 (%):  [40 %-100 %] 40 % Set Rate:  [20 bmp] 20 bmp Vt Set:  [620 mL] 620 mL PEEP:  [5 cmH20] 5 cmH20 Plateau Pressure:  [24 cmH20-25 cmH20] 25 cmH20 Dg Chest Portable 1 View  06/16/2012  *RADIOLOGY REPORT*  Clinical Data: Endotracheal tube placement.  PORTABLE CHEST - 1 VIEW  Comparison: 06/16/2012 7:58 a.m.  Findings: Endotracheal tube tip 2.5 cm above the carina.  Nasogastric tube courses below the diaphragm.  The tip is not included on this exam.  Heart size top normal.  Pulmonary vascular congestion/pulmonary edema.  Difficult to exclude basilar infiltrate in proper clinical setting.  Left central line tip proximal superior vena cava level.  No gross pneumothorax.  IMPRESSION: Endotracheal tube tip 2.5 cm above the carina.  Results discussed with [facility] patients nurse.  Please see above.   Original Report Authenticated By: Lacy Duverney, M.D.    Dg Chest Portable 1 View  06/16/2012  *RADIOLOGY REPORT*  Clinical Data: Endotracheal tube placement.  PORTABLE CHEST - 1 VIEW  Comparison: 06/16/2012 2:13 a.m.  Findings: Endotracheal tube has been retracted with the tip 6 cm above the carina.  Left central line tip mid to proximal superior vena cava level.  Nasogastric tube tip gastric fundus/body junction.  Levoscoliosis thoracic spine.  Cardiomegaly.  Pulmonary vascular congestion/pulmonary edema.  Difficult to exclude the basilar infiltrate in the proper clinical setting.  IMPRESSION:  Endotracheal tube has been retracted with the tip 6 cm above the carina.  Cardiomegaly.  Pulmonary vascular congestion/pulmonary edema.  Difficult to exclude the basilar infiltrate in the proper clinical setting.   Original Report  Authenticated By: Lacy Duverney, M.D.    Dg Chest Port 1 View  06/16/2012  *RADIOLOGY REPORT*  Clinical Data: Aspiration  PORTABLE CHEST - 1 VIEW  Comparison: 06/15/2012  Findings: The endotracheal tube tip is just above the level of the carina, approximately 1.4 cm.  Left PICC catheter tip projects over the proximal SVC.  NG tube descends below the level of the image, side port just below the GE junction.  Cardiomegaly.  Central vascular congestion. Moderate retrocardiac and mild right lung base opacities. Small left effusion not excluded.  No pneumothorax.  No acute osseous finding.  IMPRESSION: Support devices as above.  Endotracheal tube tip 1.4 cm above the carina. Consider retracting 1-2 cm.  Left greater than right lung base opacities; atelectasis, aspiration, or infiltrate.   Original Report Authenticated By: Jearld Lesch, M.D.    Dg Chest Port 1 View  06/15/2012  *RADIOLOGY REPORT*  Clinical Data: Fluid overload.  PORTABLE CHEST - 1 VIEW  Comparison: 06/12/2012  Findings: There is mild cardiomegaly and vascular congestion.  Left PICC line has been placed with the tip in the SVC.  Continued left base atelectasis.  Right lung is clear.  No visible effusions.  IMPRESSION: No significant change.   Original Report Authenticated By: Charlett Nose, M.D.    Dg Abd Portable 1v  06/15/2012  *RADIOLOGY REPORT*  Clinical Data: 67 year old male NG tube placement.  PORTABLE ABDOMEN - 1 VIEW  Comparison: 06/10/2012. Chest radiograph 06/12/2012.  Findings: Portable views of the abdomen and pelvis.  Mild motion artifact.  Tubing visible over the mediastinum, tip at the level of the gastric fundus.  Additionally EKG leads and wires. Nonobstructed bowel gas pattern. Stable visualized osseous structures.    Stable lung bases.  IMPRESSION: Enteric tube tip at the level of the gastric fundus. Nonobstructed bowel gas pattern.   Original Report Authenticated By: Erskine Speed, M.D.    PULM ETT:  7.5, 21cm at the teeth,  placed 06/16/12 (after retracting in response to CXR)  A:  Aspiration pneumonitis Acute respiratory failure P:  SBTs but hold off extubation  3-2 start abx    CARDIOVASCULAR No results found for this basename: TROPONINI, LATICACIDVEN,  O2SATVEN, PROBNP,  in the last 168 hours ECG:  Sinus tachycardia Lines: left picc line  A: HTN P:  Continue antihypertensives, may need to decrease while sedated.   RENAL  Recent Labs Lab 06/12/12 0141 06/13/12 0500 06/13/12 1627 06/14/12 0432 06/15/12 0500 06/16/12 0255  NA 150* 148*  --  146* 144 139  K 3.7 3.4*  --  3.6 3.7 3.8  CL 119* 113*  --  113* 111 106  CO2 21 24  --  22 24 24   BUN 27* 34*  --  29* 26* 25*  CREATININE 1.67* 2.07*  --  1.77* 1.78* 1.78*  CALCIUM 8.2* 8.5  --  8.4 8.2* 7.8*  MG  --   --  2.1 2.2  --   --    Intake/Output     03/01 0701 - 03/02 0700 03/02 0701 - 03/03 0700   I.V. (mL/kg) 1678.2 (18.5)    NG/GT 60  IV Piggyback     Total Intake(mL/kg) 1738.2 (19.2)    Urine (mL/kg/hr) 955 (0.4)    Emesis/NG output 300 (0.1)    Total Output 1255     Net +483.2           Foley:  In place  A:  CKD P:   Cr currently 1.78 Was getting D5W for hypernatremia -dc now that Na down start free water flushes if Na rises again  GASTROINTESTINAL  Recent Labs Lab 06/13/12 0500 06/15/12 0500  AST 24 30  ALT 30 33  ALKPHOS 66 87  BILITOT 0.4 0.5  PROT 5.7* 6.0  ALBUMIN 2.4* 2.6*    A:  Failed modified barium swallow P:   Speech follow, had been NPO with some PO trials Will restart tube feeds when GI bleeding subsides Per palliative care notes family would be willing to consider PEG placement.   HEMATOLOGIC  Recent Labs Lab 06/12/12 0141 06/13/12 0500 06/14/12 0432 06/15/12 0500 06/16/12 0255  HGB 10.2* 9.5* 9.8* 9.5* 9.5*  HCT 31.1* 28.9* 29.5* 29.0* 28.7*  PLT 169 207 214 205 242   A:  Anemia P:  Likely iatrogenic - will try to limit blood draws  INFECTIOUS  Recent Labs Lab  06/12/12 0141 06/13/12 0500 06/14/12 0432 06/15/12 0500 06/16/12 0255  WBC 4.6 5.5 7.4 8.4 5.9   Cultures: 3-2 sputum>> 3-2 bc x 2>> 3-2 uc>> Antibiotics: 3-2 vanc>> 3-2 zoysn>>  A:  Monitor for signs of developing pneumonia P:   Ct vanc/ zosyn  ENDOCRINE  Recent Labs Lab 06/15/12 2052 06/15/12 2313 06/16/12 0230 06/16/12 0337 06/16/12 0714  GLUCAP 244* 229* 162* 103* 147*   A:  DM   P:   q4h accuchecks and ssi  NEUROLOGIC  A:  S/p CVA, seizure d/o, dysphagia P:   Continue dilantin, lacosamide Sedation with propofol and fentanyl  BEST PRACTICE / DISPOSITION Level of Care:  ICU Primary Service:  PCCM Consultants:  Palliative care, neurology Code Status:  full Diet:  Tube feeds DVT Px:  heparin GI Px:  PPI Skin Integrity:  good Social / Family:    Brett Canales Minor ACNP Adolph Pollack PCCM Pager [phone] till 3 pm If no answer page [phone]  Care during the described time interval was provided by me and/or other providers on the critical care team.  I have reviewed this patient's available data, including medical history, events of note, physical examination and test results as part of my evaluation  CC time x 31 m  ALVA,RAKESH V.  06/16/2012, 8:31 AM

## 2012-06-16 NOTE — Progress Notes (Signed)
Tube advanced due to leak and volumes being low. Tube advanced to 25cm. Chest xray confirmed.

## 2012-06-16 NOTE — Progress Notes (Signed)
At 0130 pt was sitting straight up in bed and vomited approx. 300cc coffee ground emesis. Placed ng to low wall suction and noticed approximately 400 cc return of same drainage. Pt cleaned and placed on 100% non rebreather for sats in the 80's. Elink notified along with rapid response. Blood gas obtained per orders and Dr. Belinda Block to room to intubate. Pt tolerated procedure well. Blood pressure elevated 227/117 and labetelol given as ordered. Report to Crist Fat on 2100 and pt transported to 2100 at 220 am.

## 2012-06-16 NOTE — Progress Notes (Signed)
Chaplain made attempts to visit family as pt is intubated. Will continue to follow-up and refer to unit Chaplain. Marjory Lies Chaplain

## 2012-06-17 ENCOUNTER — Inpatient Hospital Stay (HOSPITAL_COMMUNITY): Payer: PRIVATE HEALTH INSURANCE

## 2012-06-17 LAB — GLUCOSE, CAPILLARY: Glucose-Capillary: 122 mg/dL — ABNORMAL HIGH (ref 70–99)

## 2012-06-17 LAB — URINE MICROSCOPIC-ADD ON

## 2012-06-17 LAB — URINE CULTURE: Special Requests: NORMAL

## 2012-06-17 LAB — COMPREHENSIVE METABOLIC PANEL
BUN: 47 mg/dL — ABNORMAL HIGH (ref 6–23)
Calcium: 7.9 mg/dL — ABNORMAL LOW (ref 8.4–10.5)
Creatinine, Ser: 3.09 mg/dL — ABNORMAL HIGH (ref 0.50–1.35)
GFR calc Af Amer: 22 mL/min — ABNORMAL LOW (ref >90)
Glucose, Bld: 129 mg/dL — ABNORMAL HIGH (ref 70–99)
Sodium: 141 mEq/L (ref 135–145)
Total Protein: 5.8 g/dL — ABNORMAL LOW (ref 6.0–8.3)

## 2012-06-17 LAB — URINALYSIS, ROUTINE W REFLEX MICROSCOPIC
Glucose, UA: NEGATIVE mg/dL
Hgb urine dipstick: NEGATIVE
Ketones, ur: 15 mg/dL — AB
Protein, ur: 30 mg/dL — AB

## 2012-06-17 LAB — CBC
Hemoglobin: 8.4 g/dL — ABNORMAL LOW (ref 13.0–17.0)
MCH: 31.1 pg (ref 26.0–34.0)
MCHC: 32.2 g/dL (ref 30.0–36.0)
RDW: 15.6 % — ABNORMAL HIGH (ref 11.5–15.5)

## 2012-06-17 LAB — PHOSPHORUS: Phosphorus: 4.8 mg/dL — ABNORMAL HIGH (ref 2.3–4.6)

## 2012-06-17 MED ORDER — JEVITY 1.2 CAL PO LIQD
1000.0000 mL | ORAL | Status: DC
  Administered 2012-06-17 – 2012-06-20 (×3): 1000 mL
  Filled 2012-06-17 (×10): qty 1000

## 2012-06-17 MED ORDER — VANCOMYCIN HCL 1000 MG IV SOLR
1000.0000 mg | INTRAVENOUS | Status: DC
  Filled 2012-06-17: qty 1000

## 2012-06-17 MED ORDER — OSMOLITE 1.2 CAL PO LIQD
1000.0000 mL | ORAL | Status: DC
Start: 2012-06-17 — End: 2012-06-17
  Filled 2012-06-17 (×2): qty 1000

## 2012-06-17 MED ORDER — PRO-STAT SUGAR FREE PO LIQD
30.0000 mL | Freq: Three times a day (TID) | ORAL | Status: DC
  Administered 2012-06-17 – 2012-06-22 (×14): 30 mL
  Filled 2012-06-17 (×19): qty 30

## 2012-06-17 MED ORDER — LORAZEPAM 2 MG/ML IJ SOLN
INTRAMUSCULAR | Status: AC
  Filled 2012-06-17: qty 1

## 2012-06-17 NOTE — Progress Notes (Signed)
SLP Cancellation Note  Patient Details Name: Robert Mcclure MRN: [id number] DOB: Apr 16, 1946   Cancelled treatment:        Patient now on vent s/p aspiration of coffee ground emesis.  SLP will s/o for now.  Please re-order swallow evaluation after extubation and when patient is alert and able to cooperate.  Will likely need objective swallow study.   Maryjo Rochester T 06/17/2012, 11:47 AM

## 2012-06-17 NOTE — Progress Notes (Signed)
NUTRITION FOLLOW UP  Intervention:    Start Jevity 1.2 at 10 ml/h, increase by 10 ml every 8 hours to goal rate of 65 ml/h with Prostat 30 ml TID to provide 2172 kcals, 132 gm protein, 1264 ml free water daily.  Consider Reglan to help stimulate gut function.    If intolerance to feedings persists, recommend placing enteral feeding tube in a post-pyloric position.   Nutrition Dx:   Inadequate oral intake related to inability to eat as evidenced by NPO status. -  ongoing  Goal:   Intake to meet >90% of estimated nutrition needs, unmet.  Monitor:    TF tolerance/adequacy, vent status, weight trend, labs.  Assessment:   Patient was transferred to the ICU 3/2 and intubated after coffee ground emesis and aspiration on SDU.  Patient has been receiving feedings via NG tube due to dysphagia.  Recent TF intolerance with vomiting.  Per MD note, patient without coffee ground emesis at this time.  NG tube has been to suction, 125 ml output at 22:00 over night. Palliative Care Team is following patient and family is open to PEG placement if needed.  Plans to resume TF via NGT while intubated.  Patient is currently intubated on ventilator support.  MV: 13.8 Temp:Temp (24hrs), Avg:100.7 F (38.2 C), Min:99.8 F (37.7 C), Max:101.7 F (38.7 C)  Propofol: 5.4 ml/hr providing 143 kcals/day.   Height: Ht Readings from Last 1 Encounters:  05/26/2012 6' (1.829 m)    Weight Status:   Wt Readings from Last 1 Encounters:  06/17/12 198 lb 3.1 oz (89.9 kg)  06/14/12  193 lb 2 oz (87.6 kg)  06/10/12  196 lb 9.6 oz (89.177 kg)  Admit weight: 05/27/2012 - 206 lbs - weight down since admission related to fluids.  Body mass index is 26.87 kg/(m^2).  Re-estimated needs:  Kcal: 2305 Protein: 120-140 gm Fluid: 2.3 L  Skin: Intact  Diet Order: NPO    Last BM: unsure  Labs:   Recent Labs Lab 06/14/12 0432 06/15/12 0500 06/16/12 0255 06/16/12 2120 06/17/12 0500  NA 146* 144 139 141  --   K  3.6 3.7 3.8 4.5  --   CL 113* 111 106 105  --   CO2 22 24 24 21   --   BUN 29* 26* 25* 38*  --   CREATININE 1.77* 1.78* 1.78* 2.89*  --   CALCIUM 8.4 8.2* 7.8* 7.7*  --   MG 2.2  --   --  2.0 2.0  PHOS  --   --   --   --  4.8*  GLUCOSE 171* 180* 176* 114*  --     CBG (last 3)   Recent Labs  06/16/12 2328 06/17/12 0346 06/17/12 0708  GLUCAP 122* 119* 131*    Scheduled Meds: . albuterol  8 puff Inhalation Q4H  . antiseptic oral rinse  15 mL Mouth Rinse QID  . chlorhexidine  15 mL Mouth Rinse BID  . cloNIDine  0.3 mg Transdermal Weekly  . feeding supplement (JEVITY 1.2 CAL)  1,000 mL Per Tube Q24H  . insulin aspart  0-9 Units Subcutaneous Q4H  . ipratropium  6 puff Inhalation Q4H  . lacosamide (VIMPAT) IV  100 mg Intravenous Q12H  . LORazepam      . metoprolol  5 mg Intravenous Q6H  . pantoprazole (PROTONIX) IV  40 mg Intravenous Q12H  . phenytoin (DILANTIN) IV  100 mg Intravenous TID  . piperacillin-tazobactam (ZOSYN)  IV  3.375 g Intravenous  Q8H  . sodium chloride  10-40 mL Intracatheter Q12H  . vancomycin  1,500 mg Intravenous Q24H    Continuous Infusions: . sodium chloride 20 mL/hr at 06/17/12 0513  . feeding supplement (OSMOLITE 1.2 CAL)    . fentaNYL infusion INTRAVENOUS 75 mcg/hr (06/17/12 0745)  . propofol 10 mcg/kg/min (06/17/12 0745)     Joaquin Courts, RD, LDN, CNSC Pager# [phone] After Hours Pager# [phone]

## 2012-06-17 NOTE — Progress Notes (Signed)
Name: Robert Mcclure MRN: [id number] DOB: 11-18-1945    LOS: 15  Referring Robert Mcclure:  Robert Mcgregor NP Reason for Referral:  Hypoxic respiratory failure  PULMONARY / CRITICAL CARE MEDICINE    Brief patient description:  67 y/o with CVA, seizure d/o and HTN, intubated for respiratory failure following witnessed aspiration event. He was admitted on June 22, 2012 to the ICU on the PCCM service in status epilepticus (required intubation for airway protection). He was extubated on 2/19 and transferred to the hospitalist service on 2/21. He has had trouble with blood pressure management since transfer. He has also required an NG tube for medications and feeding. Early in the morning on 3/2 (around 1:30 am). He vomited what appeared to be coffee grounds. He then developed respiratory distress    Events Since Admission: Remains on vent  Vital Signs: Temp:  [99.8 F (37.7 C)-101.7 F (38.7 C)] 100.6 F (38.1 C) (03/03 0700) Pulse Rate:  [103-125] 112 (03/03 0736) Resp:  [17-27] 18 (03/03 0736) BP: (96-183)/(56-76) 138/59 mmHg (03/03 0736) SpO2:  [95 %-100 %] 100 % (03/03 0736) FiO2 (%):  [40 %] 40 % (03/03 0747) Weight:  [89.9 kg (198 lb 3.1 oz)] 89.9 kg (198 lb 3.1 oz) (03/03 0500)  Physical Examination: General:  Elderly man,sedated on vent, rass -2 Neuro:  Sedated on vent, rass -2, alert, rt flaccid old HEENT: jvd wnl Neck:  Supple, no masses, jvd wnl Cardiovascular:  NRRR, no mrg Lungs:  ronchi mild coarse Abdomen:  Soft, NTND, +BS Musculoskeletal:  No joint abnormalities Skin:  No rashes   Principal Problem:   Acute respiratory failure Active Problems:   HTN (hypertension), malignant   CVA (cerebral vascular accident)   Diabetes mellitus   Aspiration pneumonia   Encephalopathy, metabolic   Dysphagia, unspecified   Altered mental status   Weakness generalized   ASSESSMENT AND PLAN  PULMONARY  Recent Labs Lab 06/16/12 0135 06/16/12 0229  PHART 7.405 7.360   PCO2ART 36.8 40.4  PO2ART 60.4* 218.0*  HCO3 22.3 21.7  O2SAT 90.8 99.4   Ventilator Settings: Vent Mode:  [-] PSV;CPAP FiO2 (%):  [40 %] 40 % Set Rate:  [20 bmp] 20 bmp Vt Set:  [620 mL] 620 mL PEEP:  [5 cmH20] 5 cmH20 Pressure Support:  [8 cmH20] 8 cmH20 Plateau Pressure:  [18 cmH20-23 cmH20] 18 cmH20 Dg Chest Port 1 View  06/17/2012  *RADIOLOGY REPORT*  Clinical Data: Respiratory failure  PORTABLE CHEST - 1 VIEW  Comparison: 06/16/2012  Findings: Endotracheal tube tip approximately 1.5 cm above the carina.  Some improvement in aeration of the left lower lobe but is identified with persistent atelectasis/infiltrates remaining in both lower lung zones.  There likely is a small left pleural effusion.  No overt edema identified.  The heart size is stable.  IMPRESSION: Some improvement in aeration of the left lower lobe.  Persistent bilateral lower lobe atelectasis/infiltrates.   Original Report Authenticated By: [name], M.D.    Dg Chest Portable 1 View  06/16/2012  *RADIOLOGY REPORT*  Clinical Data: Endotracheal tube placement.  PORTABLE CHEST - 1 VIEW  Comparison: 06/16/2012 7:58 a.m.  Findings: Endotracheal tube tip 2.5 cm above the carina.  Nasogastric tube courses below the diaphragm.  The tip is not included on this exam.  Heart size top normal.  Pulmonary vascular congestion/pulmonary edema.  Difficult to exclude basilar infiltrate in proper clinical setting.  Left central line tip proximal superior vena cava level.  No gross pneumothorax.  IMPRESSION: Endotracheal tube tip  2.5 cm above the carina.  Results discussed with [facility] patients nurse.  Please see above.   Original Report Authenticated By: Lacy Duverney, M.D.    Dg Chest Portable 1 View  06/16/2012  *RADIOLOGY REPORT*  Clinical Data: Endotracheal tube placement.  PORTABLE CHEST - 1 VIEW  Comparison: 06/16/2012 2:13 a.m.  Findings: Endotracheal tube has been retracted with the tip 6 cm above the carina.  Left central line tip  mid to proximal superior vena cava level.  Nasogastric tube tip gastric fundus/body junction.  Levoscoliosis thoracic spine.  Cardiomegaly.  Pulmonary vascular congestion/pulmonary edema.  Difficult to exclude the basilar infiltrate in the proper clinical setting.  IMPRESSION:  Endotracheal tube has been retracted with the tip 6 cm above the carina.  Cardiomegaly.  Pulmonary vascular congestion/pulmonary edema.  Difficult to exclude the basilar infiltrate in the proper clinical setting.   Original Report Authenticated By: Lacy Duverney, M.D.    Dg Chest Port 1 View  06/16/2012  *RADIOLOGY REPORT*  Clinical Data: Aspiration  PORTABLE CHEST - 1 VIEW  Comparison: 06/15/2012  Findings: The endotracheal tube tip is just above the level of the carina, approximately 1.4 cm.  Left PICC catheter tip projects over the proximal SVC.  NG tube descends below the level of the image, side port just below the GE junction.  Cardiomegaly.  Central vascular congestion. Moderate retrocardiac and mild right lung base opacities. Small left effusion not excluded.  No pneumothorax.  No acute osseous finding.  IMPRESSION: Support devices as above.  Endotracheal tube tip 1.4 cm above the carina. Consider retracting 1-2 cm.  Left greater than right lung base opacities; atelectasis, aspiration, or infiltrate.   Original Report Authenticated By: Jearld Lesch, M.D.    Dg Chest Port 1 View  06/15/2012  *RADIOLOGY REPORT*  Clinical Data: Fluid overload.  PORTABLE CHEST - 1 VIEW  Comparison: 06/12/2012  Findings: There is mild cardiomegaly and vascular congestion.  Left PICC line has been placed with the tip in the SVC.  Continued left base atelectasis.  Right lung is clear.  No visible effusions.  IMPRESSION: No significant change.   Original Report Authenticated By: Charlett Nose, M.D.    PULM pcxr- bilat infiltrates, int , ett wnl  A:  Aspiration pneumonitis Acute respiratory failure P:   abg reviewed Wean sbt , cpap 5 ps 5, goal  30 min  pcxr in am , re eval int changes upright position Control seziures  CARDIOVASCULAR  Recent Labs Lab 06/16/12 0900  PROBNP 3241.0*   ECG:  Sinus tachycardia Lines: left picc line  A: HTN P:  Tele BP wnl  RENAL  Recent Labs Lab 06/13/12 0500 06/13/12 1627 06/14/12 0432 06/15/12 0500 06/16/12 0255 06/16/12 2120 06/17/12 0500  NA 148*  --  146* 144 139 141  --   K 3.4*  --  3.6 3.7 3.8 4.5  --   CL 113*  --  113* 111 106 105  --   CO2 24  --  22 24 24 21   --   BUN 34*  --  29* 26* 25* 38*  --   CREATININE 2.07*  --  1.77* 1.78* 1.78* 2.89*  --   CALCIUM 8.5  --  8.4 8.2* 7.8* 7.7*  --   MG  --  2.1 2.2  --   --  2.0 2.0  PHOS  --   --   --   --   --   --  4.8*   Intake/Output  03/02 0701 - 03/03 0700 03/03 0701 - 03/04 0700   I.V. (mL/kg) 787.1 (8.8) 42.7 (0.5)   NG/GT 150 30   IV Piggyback 552.5    Total Intake(mL/kg) 1489.6 (16.6) 72.7 (0.8)   Urine (mL/kg/hr) 278 (0.1) 75 (0.5)   Emesis/NG output 525 (0.2)    Total Output 803 75   Net +686.6 -2.3         Foley:  In place  A:  CKD, ARF P:   Chem now Na corrected ATn Likely Review nephrotoxic agents MAR? Chem in am ' Allow pos balance Urine na, osm, UA May need cvp  GASTROINTESTINAL  Recent Labs Lab 06/13/12 0500 06/15/12 0500 06/16/12 2120  AST 24 30 26   ALT 30 33 25  ALKPHOS 66 87 65  BILITOT 0.4 0.5 0.5  PROT 5.7* 6.0 5.9*  ALBUMIN 2.4* 2.6* 2.1*    A:  Failed modified barium swallow, dysphagia, no coffee grounds noted now P:   Npo, consider TF if not extubated Per palliative care notes family would be willing to consider PEG placement.  [ppi lft repeat No major drops in hct  HEMATOLOGIC  Recent Labs Lab 06/14/12 0432 06/15/12 0500 06/16/12 0255 06/16/12 2120 06/17/12 0500  HGB 9.8* 9.5* 9.5* 9.0* 8.4*  HCT 29.5* 29.0* 28.7* 27.4* 26.1*  PLT 214 205 242 208 205   A:  Anemia, dvt prevention P:  scd Cbc in am   INFECTIOUS  Recent Labs Lab  06/14/12 0432 06/15/12 0500 06/16/12 0255 06/16/12 2120 06/17/12 0500  WBC 7.4 8.4 5.9 9.6 10.9*   Cultures: 3-2 sputum>> 3-2 bc x 2>> 3-2 uc>> Antibiotics: 3-2 vanc>> 3-2 zoysn>>  A:  Monitor for signs of developing pneumonia P:   Ct vanc/ zosyn Sputum eval  ENDOCRINE  Recent Labs Lab 06/16/12 1521 06/16/12 1917 06/16/12 2328 06/17/12 0346 06/17/12 0708  GLUCAP 87 95 122* 119* 131*   A:  DM   P:   q4h accuchecks and ssi  NEUROLOGIC  A:  S/p CVA, seizure d/o, dysphagia P:   Continue dilantin, lacosamide Sedation with propofol and fentanyl - tolerating this WUA Dil level donw  BEST PRACTICE / DISPOSITION Level of Care:  ICU Primary Service:  PCCM Consultants:  Palliative care, neurology Code Status:  full Diet:  Tube feeds DVT Px:  scd GI Px:  PPI Skin Integrity:  good Social / Family:    Ccm time 26 kin   Mcarthur Rossetti. Tyson Alias, MD, FACP Pgr: [phone] [place]

## 2012-06-17 NOTE — Progress Notes (Signed)
Clinical Social Worker (CSW) observed that pt has transferred to 2100. CSW provided a handoff to unit CSW who will begin following. This CSW signing off.  Robert Mcclure, MSW, LCSWA [phone] 

## 2012-06-18 ENCOUNTER — Inpatient Hospital Stay (HOSPITAL_COMMUNITY): Payer: PRIVATE HEALTH INSURANCE

## 2012-06-18 LAB — CBC WITH DIFFERENTIAL/PLATELET
Basophils Absolute: 0 10*3/uL (ref 0.0–0.1)
Basophils Relative: 0 % (ref 0–1)
Eosinophils Absolute: 0.2 10*3/uL (ref 0.0–0.7)
HCT: 22.7 % — ABNORMAL LOW (ref 39.0–52.0)
Hemoglobin: 7.6 g/dL — ABNORMAL LOW (ref 13.0–17.0)
Hemoglobin: 7.7 g/dL — ABNORMAL LOW (ref 13.0–17.0)
Lymphocytes Relative: 10 % — ABNORMAL LOW (ref 12–46)
Lymphs Abs: 0.8 10*3/uL (ref 0.7–4.0)
MCH: 31.9 pg (ref 26.0–34.0)
MCH: 31.8 pg (ref 26.0–34.0)
MCHC: 33.5 g/dL (ref 30.0–36.0)
MCHC: 33.6 g/dL (ref 30.0–36.0)
MCV: 94.6 fL (ref 78.0–100.0)
Monocytes Absolute: 0.6 10*3/uL (ref 0.1–1.0)
Monocytes Relative: 6 % (ref 3–12)
Neutro Abs: 8.4 10*3/uL — ABNORMAL HIGH (ref 1.7–7.7)
Neutrophils Relative %: 79 % — ABNORMAL HIGH (ref 43–77)
RBC: 2.38 MIL/uL — ABNORMAL LOW (ref 4.22–5.81)
RBC: 2.42 MIL/uL — ABNORMAL LOW (ref 4.22–5.81)

## 2012-06-18 LAB — GLUCOSE, CAPILLARY
Glucose-Capillary: 138 mg/dL — ABNORMAL HIGH (ref 70–99)
Glucose-Capillary: 156 mg/dL — ABNORMAL HIGH (ref 70–99)
Glucose-Capillary: 160 mg/dL — ABNORMAL HIGH (ref 70–99)
Glucose-Capillary: 164 mg/dL — ABNORMAL HIGH (ref 70–99)
Glucose-Capillary: 170 mg/dL — ABNORMAL HIGH (ref 70–99)
Glucose-Capillary: 178 mg/dL — ABNORMAL HIGH (ref 70–99)
Glucose-Capillary: 194 mg/dL — ABNORMAL HIGH (ref 70–99)

## 2012-06-18 LAB — BASIC METABOLIC PANEL
BUN: 53 mg/dL — ABNORMAL HIGH (ref 6–23)
Chloride: 103 mEq/L (ref 96–112)
Glucose, Bld: 155 mg/dL — ABNORMAL HIGH (ref 70–99)
Potassium: 3.9 mEq/L (ref 3.5–5.1)
Sodium: 139 mEq/L (ref 135–145)

## 2012-06-18 LAB — PROTIME-INR: Prothrombin Time: 16.1 seconds — ABNORMAL HIGH (ref 11.6–15.2)

## 2012-06-18 MED ORDER — SODIUM CHLORIDE 0.9 % IV SOLN
200.0000 mg | Freq: Two times a day (BID) | INTRAVENOUS | Status: DC
  Filled 2012-06-18 (×2): qty 20

## 2012-06-18 MED ORDER — METOCLOPRAMIDE HCL 5 MG/ML IJ SOLN
5.0000 mg | Freq: Four times a day (QID) | INTRAMUSCULAR | Status: DC
  Administered 2012-06-18 – 2012-06-20 (×8): 5 mg via INTRAVENOUS
  Filled 2012-06-18 (×12): qty 1

## 2012-06-18 MED ORDER — LINEZOLID 2 MG/ML IV SOLN
600.0000 mg | Freq: Two times a day (BID) | INTRAVENOUS | Status: DC
  Administered 2012-06-18 (×2): 600 mg via INTRAVENOUS
  Filled 2012-06-18 (×5): qty 300

## 2012-06-18 MED ORDER — DEXTROSE 5 % IV SOLN
1.0000 g | INTRAVENOUS | Status: DC
  Administered 2012-06-18: 1 g via INTRAVENOUS
  Filled 2012-06-18: qty 10

## 2012-06-18 MED ORDER — PHENYTOIN SODIUM 50 MG/ML IJ SOLN
100.0000 mg | Freq: Three times a day (TID) | INTRAMUSCULAR | Status: DC
  Administered 2012-06-18 – 2012-06-23 (×15): 100 mg via INTRAVENOUS
  Filled 2012-06-18 (×17): qty 2

## 2012-06-18 MED ORDER — MIDAZOLAM HCL 5 MG/ML IJ SOLN
1.0000 mg/h | INTRAMUSCULAR | Status: DC
  Administered 2012-06-18: 1 mg/h via INTRAVENOUS
  Filled 2012-06-18: qty 10

## 2012-06-18 MED ORDER — SODIUM CHLORIDE 0.9 % IV SOLN
500.0000 mg | Freq: Two times a day (BID) | INTRAVENOUS | Status: DC
  Administered 2012-06-18 – 2012-06-23 (×10): 500 mg via INTRAVENOUS
  Filled 2012-06-18 (×11): qty 5

## 2012-06-18 MED ORDER — SODIUM CHLORIDE 0.9 % IV SOLN
200.0000 mg | Freq: Two times a day (BID) | INTRAVENOUS | Status: DC
  Administered 2012-06-18 – 2012-06-22 (×9): 200 mg via INTRAVENOUS
  Filled 2012-06-18 (×18): qty 20

## 2012-06-18 NOTE — Progress Notes (Signed)
Clinical Social Worker staffed case with RN, RNCM, and MD during board rounds.  CSW reviewed case with PMT.  Goals of Care currently being discussed with family.  CSW to continue to follow and assist as needed.   Angelia Mould, MSW, [place] [phone]

## 2012-06-18 NOTE — Progress Notes (Signed)
EEG completed.

## 2012-06-18 NOTE — Progress Notes (Signed)
NEURO HOSPITALIST PROGRESS NOTE   SUBJECTIVE:                                                                                                                        Events of the last 24 hours noted. Concerns that he could still be seizing prompted IV ativan and starting versed drip. He is also receiving sedation with diprivan and fentanyl. On dilantin 100 MG iv TID and lacosamide IV 100 mg BID. Off keppra.   OBJECTIVE:                                                                                                                           Vital signs in last 24 hours: Temp:  [99.1 F (37.3 C)-100.8 F (38.2 C)] 100.8 F (38.2 C) (03/04 0900) Pulse Rate:  [95-128] 114 (03/04 0900) Resp:  [12-23] 12 (03/04 0900) BP: (91-177)/(54-104) 164/79 mmHg (03/04 0900) SpO2:  [96 %-100 %] 98 % (03/04 0900) FiO2 (%):  [40 %] 40 % (03/04 0800) Weight:  [90.2 kg (198 lb 13.7 oz)] 90.2 kg (198 lb 13.7 oz) (03/04 0500)  Intake/Output from previous day: 03/03 0701 - 03/04 0700 In: 2280.4 [I.V.:1060.4; NG/GT:750; IV Piggyback:470] Out: 740 [Urine:740] Intake/Output this shift: Total I/O In: 40 [I.V.:20; NG/GT:20] Out: 85 [Urine:85] Nutritional status: NPO  Past Medical History  Diagnosis Date  . Hypertension   . Diabetes mellitus   . CVA (cerebral vascular accident)   . Stroke     Neurologic ROS negative with exception of above.   Neurologic Exam:  Patient received a bolus of ativan and started on on IV versed, intubated, neuro-exam will be defer due to patients heavy sedation.  Lab Results: Lab Results  Component Value Date/Time   CHOL  Value: 212        ATP III CLASSIFICATION:  <200     mg/dL   Desirable  161-096  mg/dL   Borderline High  >=045    mg/dL   High       * 07/24/8117  6:45 AM   Lipid Panel No results found for this basename: CHOL, TRIG, HDL, CHOLHDL, VLDL, LDLCALC,  in the last 72 hours  Studies/Results: Dg Chest Port 1  View  06/18/2012  *  RADIOLOGY REPORT*  Clinical Data: Check ETT  PORTABLE CHEST - 1 VIEW  Comparison: 06/17/2012  Findings: Endotracheal tube terminates 2 cm above the carina.  Enteric tube courses below the diaphragm.  Stable left arm PICC. Patchy opacities within the left perihilar region and bilateral lower lobes, suspicious for pneumonia.  Superimposed mild interstitial edema is possible.  Small left pleural effusion.  No pneumothorax.  Cardiomegaly.  IMPRESSION: Endotracheal tube terminates 2 cm above the carina.  Patchy opacities within the left perihilar region and bilateral lower lobes, suspicious for pneumonia.  Small left pleural effusion.   Original Report Authenticated By: Charline Bills, M.D.    Dg Chest Port 1 View  06/17/2012  *RADIOLOGY REPORT*  Clinical Data: Respiratory failure  PORTABLE CHEST - 1 VIEW  Comparison: 06/16/2012  Findings: Endotracheal tube tip approximately 1.5 cm above the carina.  Some improvement in aeration of the left lower lobe but is identified with persistent atelectasis/infiltrates remaining in both lower lung zones.  There likely is a small left pleural effusion.  No overt edema identified.  The heart size is stable.  IMPRESSION: Some improvement in aeration of the left lower lobe.  Persistent bilateral lower lobe atelectasis/infiltrates.   Original Report Authenticated By: [name], M.D.    Dg Abd Portable 1v  06/18/2012  *RADIOLOGY REPORT*  Clinical Data: Abdominal distention.  PORTABLE ABDOMEN - 1 VIEW  Comparison: 06/15/2012.  Findings: The NG tube is in the fundal region of the stomach and the proximal port is in the distal esophagus.  This could be advanced several centimeters.  Moderate air in the colon and scattered air in the small bowel suggests a mild ileus.  No free air or findings for small bowel obstruction.  The soft tissue shadows are maintained.  The bony structures are intact.  IMPRESSION:  1.  The NG tube tip is in the fundal region of the  stomach just below the GE junction.  The proximal port is in the distal esophagus.  This could be advanced several centimeters. 2.  Mild diffuse ileus bowel gas pattern.   Original Report Authenticated By: Rudie Meyer, M.D.     MEDICATIONS                                                                                                                       I have reviewed the patient's current medications.  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 Now with concerns for ongoing seizures. EEG has been requested and will look to the study as soon it becomes available. Will get dilantin level. Increase vimpat to 200 mg BID. Resume keppra 500 mg IV BID. Will follow up.  [facility] [phone]  06/18/2012, 11:08 AM

## 2012-06-18 NOTE — Progress Notes (Signed)
MEDICATION RELATED CONSULT NOTE - FOLLOW UP   Pharmacy Consult for Phenytoin Indication: Seizures  No Known Allergies  Patient Measurements: Height: 6' (182.9 cm) Weight: 198 lb 13.7 oz (90.2 kg) IBW/kg (Calculated) : 77.6  Vital Signs: Temp: 100.2 F (37.9 C) (03/04 1200) Temp src: Core (Comment) (03/04 0400) BP: 163/85 mmHg (03/04 1200) Pulse Rate: 122 (03/04 1200) Intake/Output from previous day: 03/03 0701 - 03/04 0700 In: 2280.4 [I.V.:1060.4; NG/GT:750; IV Piggyback:470] Out: 740 [Urine:740] Intake/Output from this shift: Total I/O In: 220.3 [I.V.:75.3; NG/GT:60; IV Piggyback:85] Out: 385 [Urine:385]  Labs:  Recent Labs  06/16/12 2120 06/17/12 0500 06/17/12 0930 06/18/12 0425 06/18/12 0823 06/18/12 1150  WBC 9.6 10.9*  --  10.0  --  11.7*  HGB 9.0* 8.4*  --  7.6*  --  7.7*  HCT 27.4* 26.1*  --  22.7*  --  22.9*  PLT 208 205  --  222  --  238  APTT  --   --   --   --   --  41*  CREATININE 2.89*  --  3.09* 2.68*  --   --   MG 2.0 2.0  --   --   --   --   PHOS  --  4.8*  --   --   --   --   ALBUMIN 2.1*  --  2.0*  --  2.0*  --   PROT 5.9*  --  5.8*  --   --   --   AST 26  --  25  --   --   --   ALT 25  --  23  --   --   --   ALKPHOS 65  --  57  --   --   --   BILITOT 0.5  --  0.4  --   --   --    Estimated Creatinine Clearance: 29.4 ml/min (by C-G formula based on Cr of 2.68).   Microbiology: Recent Results (from the past 720 hour(s))  URINE CULTURE     Status: None   Collection Time    06/12/12 12:11 PM      Result Value Range Status   Specimen Description URINE, CATHETERIZED   Final   Special Requests NONE   Final   Culture  Setup Time 06-12-2012 20:14   Final   Colony Count NO GROWTH   Final   Culture NO GROWTH   Final   Report Status 06/03/2012 FINAL   Final  URINE CULTURE     Status: None   Collection Time    06-12-12  5:45 PM      Result Value Range Status   Specimen Description URINE, CATHETERIZED   Final   Special Requests NONE   Final    Culture  Setup Time 06/03/2012 02:01   Final   Colony Count NO GROWTH   Final   Culture NO GROWTH   Final   Report Status 06/04/2012 FINAL   Final  MRSA PCR SCREENING     Status: None   Collection Time    12-Jun-2012  6:13 PM      Result Value Range Status   MRSA by PCR NEGATIVE  NEGATIVE Final   Comment:            The GeneXpert MRSA Assay (FDA     approved for NASAL specimens     only), is one component of a     comprehensive MRSA colonization  surveillance program. It is not     intended to diagnose MRSA     infection nor to guide or     monitor treatment for     MRSA infections.  CULTURE, BLOOD (ROUTINE X 2)     Status: None   Collection Time    06/03/12  1:37 AM      Result Value Range Status   Specimen Description BLOOD RIGHT RADIAL ARTERIAL   Final   Special Requests BOTTLES DRAWN AEROBIC AND ANAEROBIC 5CC EA   Final   Culture  Setup Time 06/03/2012 08:25   Final   Culture NO GROWTH 5 DAYS   Final   Report Status 06/09/2012 FINAL   Final  CULTURE, BLOOD (ROUTINE X 2)     Status: None   Collection Time    06/03/12  1:50 AM      Result Value Range Status   Specimen Description BLOOD LEFT ARM   Final   Special Requests BOTTLES DRAWN AEROBIC ONLY 1CC   Final   Culture  Setup Time 06/03/2012 08:25   Final   Culture NO GROWTH 5 DAYS   Final   Report Status 06/09/2012 FINAL   Final  CULTURE, RESPIRATORY (NON-EXPECTORATED)     Status: None   Collection Time    06/03/12  3:51 AM      Result Value Range Status   Specimen Description TRACHEAL ASPIRATE   Final   Special Requests Normal   Final   Gram Stain     Final   Value: ABUNDANT WBC PRESENT, PREDOMINANTLY PMN     RARE SQUAMOUS EPITHELIAL CELLS PRESENT     MODERATE GRAM POSITIVE COCCI IN PAIRS     RARE GRAM POSITIVE RODS   Culture Non-Pathogenic Oropharyngeal-type Flora Isolated.   Final   Report Status 06/05/2012 FINAL   Final  STOOL CULTURE     Status: None   Collection Time    06/04/12  6:47 PM      Result  Value Range Status   Specimen Description STOOL   Final   Special Requests NONE   Final   Culture     Final   Value: NO SALMONELLA, SHIGELLA, CAMPYLOBACTER, YERSINIA, OR E.COLI 0157:H7 ISOLATED   Report Status 06/08/2012 FINAL   Final  URINE CULTURE     Status: None   Collection Time    06/16/12  3:27 AM      Result Value Range Status   Specimen Description URINE, CATHETERIZED   Final   Special Requests Normal   Final   Culture  Setup Time 06/16/2012 03:55   Final   Colony Count NO GROWTH   Final   Culture NO GROWTH   Final   Report Status 06/17/2012 FINAL   Final  CULTURE, RESPIRATORY (NON-EXPECTORATED)     Status: None   Collection Time    06/16/12  3:44 AM      Result Value Range Status   Specimen Description TRACHEAL ASPIRATE   Final   Special Requests NONE   Final   Gram Stain     Final   Value: MODERATE WBC PRESENT,BOTH PMN AND MONONUCLEAR     RARE SQUAMOUS EPITHELIAL CELLS PRESENT     MODERATE GRAM POSITIVE COCCI IN CLUSTERS     IN PAIRS FEW GRAM POSITIVE RODS   Culture ABUNDANT ENTEROCOCCUS SPECIES   Final   Report Status PENDING   Incomplete  CULTURE, BLOOD (ROUTINE X 2)     Status: None   Collection  Time    06/16/12  5:20 AM      Result Value Range Status   Specimen Description BLOOD RIGHT ARM   Final   Special Requests BOTTLES DRAWN AEROBIC AND ANAEROBIC 10CC EACH   Final   Culture  Setup Time 06/16/2012 15:22   Final   Culture     Final   Value:        BLOOD CULTURE RECEIVED NO GROWTH TO DATE CULTURE WILL BE HELD FOR 5 DAYS BEFORE ISSUING A FINAL NEGATIVE REPORT   Report Status PENDING   Incomplete  CULTURE, BLOOD (ROUTINE X 2)     Status: None   Collection Time    06/16/12  5:25 AM      Result Value Range Status   Specimen Description BLOOD RIGHT HAND   Final   Special Requests BOTTLES DRAWN AEROBIC ONLY 5CC   Final   Culture  Setup Time 06/16/2012 15:22   Final   Culture     Final   Value:        BLOOD CULTURE RECEIVED NO GROWTH TO DATE CULTURE WILL BE  HELD FOR 5 DAYS BEFORE ISSUING A FINAL NEGATIVE REPORT   Report Status PENDING   Incomplete  CULTURE, RESPIRATORY (NON-EXPECTORATED)     Status: None   Collection Time    06/16/12 11:49 AM      Result Value Range Status   Specimen Description TRACHEAL ASPIRATE   Final   Special Requests Normal   Final   Gram Stain     Final   Value: RARE WBC PRESENT,BOTH PMN AND MONONUCLEAR     RARE SQUAMOUS EPITHELIAL CELLS PRESENT     MODERATE GRAM POSITIVE COCCI IN CLUSTERS     IN PAIRS IN CHAINS   Culture ABUNDANT ENTEROCOCCUS SPECIES   Final   Report Status PENDING   Incomplete  CULTURE, BLOOD (ROUTINE X 2)     Status: None   Collection Time    06/16/12 11:54 AM      Result Value Range Status   Specimen Description BLOOD RIGHT FOREARM   Final   Special Requests BOTTLES DRAWN AEROBIC ONLY 9CC   Final   Culture  Setup Time 06/16/2012 17:28   Final   Culture     Final   Value:        BLOOD CULTURE RECEIVED NO GROWTH TO DATE CULTURE WILL BE HELD FOR 5 DAYS BEFORE ISSUING A FINAL NEGATIVE REPORT   Report Status PENDING   Incomplete    Medications:  Scheduled:  . albuterol  8 puff Inhalation Q4H  . antiseptic oral rinse  15 mL Mouth Rinse QID  . chlorhexidine  15 mL Mouth Rinse BID  . cloNIDine  0.3 mg Transdermal Weekly  . feeding supplement  30 mL Per Tube TID  . insulin aspart  0-9 Units Subcutaneous Q4H  . ipratropium  6 puff Inhalation Q4H  . lacosamide (VIMPAT) IV  200 mg Intravenous Q12H  . levETIRAcetam  500 mg Intravenous Q12H  . linezolid  600 mg Intravenous Q12H  . metoCLOPramide (REGLAN) injection  5 mg Intravenous Q6H  . metoprolol  5 mg Intravenous Q6H  . pantoprazole (PROTONIX) IV  40 mg Intravenous Q12H  . phenytoin (DILANTIN) IV  100 mg Intravenous Q8H  . sodium chloride  10-40 mL Intracatheter Q12H  . [DISCONTINUED] cefTRIAXone (ROCEPHIN)  IV  1 g Intravenous Q24H  . [DISCONTINUED] lacosamide (VIMPAT) IV  100 mg Intravenous Q12H  . [DISCONTINUED] lacosamide (VIMPAT) IV  200 mg Intravenous Q12H  . [DISCONTINUED] phenytoin (DILANTIN) IV  100 mg Intravenous TID  . [DISCONTINUED] piperacillin-tazobactam (ZOSYN)  IV  3.375 g Intravenous Q8H  . [DISCONTINUED] vancomycin  1,000 mg Intravenous Q24H  . [DISCONTINUED] vancomycin  1,500 mg Intravenous Q24H   Infusions:  . sodium chloride 50 mL/hr (06/18/12 1200)  . feeding supplement (JEVITY 1.2 CAL) 1,000 mL (06/17/12 1642)  . fentaNYL infusion INTRAVENOUS 75 mcg/hr (06/18/12 1000)  . midazolam (VERSED) infusion 1 mg/hr (06/18/12 1100)  . propofol 30 mcg/kg/min (06/18/12 1610)    Assessment: 67 y.o. M admitted with status epilepticus on 2/16 -- managed on Keppra initially and then transitioned to phenytoin on 2/26 after witnessed seizure activity on 2/24. Corrected phenytoin level this morning is therapeutic at 19.6. Patient is also on lacosamide (increased to 200 mg twice daily) and has been restarted on keppra for further management of his seizures. Repeat EEG today is pending.  Goal of Therapy:  Total Phenytoin level of 10-20 mcg/ml (corrected by albumin and renal function)  Plan:  1. Continue Phenytoin 100 mg IV every 8 hours  2. Continue to monitor phenytoin level and albumin when indicated 3. Will follow-up plans to transition to oral once stable  4. Will continue to monitor for any signs/symptoms of seizure activity.   Lillia Pauls, PharmD Clinical Pharmacist Pager: [phone] Phone: [phone] 06/18/2012 2:06 PM

## 2012-06-18 NOTE — Progress Notes (Signed)
Progress Note from the Palliative Medicine Team at [facility]  Subjective: patient is intubated and sedated  -spoke with Tyler Aas (sister and POA) regarding present situation and clarification of GOC understanding his overall poor prognosis and to offer opportunity to re meet for conversation  -she reports that the family is meeting with doctor from critical care tomorrow for scheduled appointment at 1pm, she further verbalizes her concerns that most of her siblings will insists on aggressive care for this patient and she is beginning to realize this "may not be the right thing to do for him".  However she also states that she will not be the only family member to go again the majority of family "if it comes down to it, majority rules".    s Objective: No Known Allergies Scheduled Meds: . albuterol  8 puff Inhalation Q4H  . antiseptic oral rinse  15 mL Mouth Rinse QID  . chlorhexidine  15 mL Mouth Rinse BID  . cloNIDine  0.3 mg Transdermal Weekly  . feeding supplement  30 mL Per Tube TID  . insulin aspart  0-9 Units Subcutaneous Q4H  . ipratropium  6 puff Inhalation Q4H  . lacosamide (VIMPAT) IV  200 mg Intravenous Q12H  . levETIRAcetam  500 mg Intravenous Q12H  . linezolid  600 mg Intravenous Q12H  . metoCLOPramide (REGLAN) injection  5 mg Intravenous Q6H  . metoprolol  5 mg Intravenous Q6H  . pantoprazole (PROTONIX) IV  40 mg Intravenous Q12H  . phenytoin (DILANTIN) IV  100 mg Intravenous Q8H  . sodium chloride  10-40 mL Intracatheter Q12H   Continuous Infusions: . sodium chloride 50 mL/hr (06/18/12 1200)  . feeding supplement (JEVITY 1.2 CAL) 1,000 mL (06/17/12 1642)  . fentaNYL infusion INTRAVENOUS 75 mcg/hr (06/18/12 1000)  . midazolam (VERSED) infusion 1 mg/hr (06/18/12 1100)  . propofol 30 mcg/kg/min (06/18/12 2952)   PRN Meds:.acetaminophen (TYLENOL) oral liquid 160 mg/5 mL, acetaminophen, chlorproMAZINE (THORAZINE) IV, fentaNYL, labetalol, LORazepam, ondansetron, sodium  chloride  BP 136/71  Pulse 116  Temp(Src) 100.5 F (38.1 C) (Core (Comment))  Resp 12  Ht 6' (1.829 m)  Wt 90.2 kg (198 lb 13.7 oz)  BMI 26.96 kg/m2  SpO2 99%   PPS: 20%   Pain Score: sedated, unresponsive    Intake/Output Summary (Last 24 hours) at 06/18/12 1420 Last data filed at 06/18/12 1400  Gross per 24 hour  Intake 1997.55 ml  Output   1040 ml  Net 957.55 ml      Physical Exam:  General: ill appearing, intubated, sedated and unresponsive HEENT:  Noted et tube Chest:  CTA, intubated CVS: tachycardic Abdomen: soft +BS Ext:  trace edema BLE Skin warm and dry Neuro: sedated and unresponsive  Labs: CBC    Component Value Date/Time   WBC 11.7* 06/18/2012 1150   RBC 2.42* 06/18/2012 1150   HGB 7.7* 06/18/2012 1150   HCT 22.9* 06/18/2012 1150   PLT 238 06/18/2012 1150   MCV 94.6 06/18/2012 1150   MCH 31.8 06/18/2012 1150   MCHC 33.6 06/18/2012 1150   RDW 15.4 06/18/2012 1150   LYMPHSABS 1.2 06/18/2012 1150   MONOABS 1.0 06/18/2012 1150   EOSABS 0.2 06/18/2012 1150   BASOSABS 0.0 06/18/2012 1150    BMET    Component Value Date/Time   NA 139 06/18/2012 0425   K 3.9 06/18/2012 0425   CL 103 06/18/2012 0425   CO2 21 06/18/2012 0425   GLUCOSE 155* 06/18/2012 0425   BUN 53* 06/18/2012 0425  CREATININE 2.68* 06/18/2012 0425   CALCIUM 7.9* 06/18/2012 0425   GFRNONAA 23* 06/18/2012 0425   GFRAA 27* 06/18/2012 0425    CMP     Component Value Date/Time   NA 139 06/18/2012 0425   K 3.9 06/18/2012 0425   CL 103 06/18/2012 0425   CO2 21 06/18/2012 0425   GLUCOSE 155* 06/18/2012 0425   BUN 53* 06/18/2012 0425   CREATININE 2.68* 06/18/2012 0425   CALCIUM 7.9* 06/18/2012 0425   PROT 5.8* 06/17/2012 0930   ALBUMIN 2.0* 06/18/2012 0823   AST 25 06/17/2012 0930   ALT 23 06/17/2012 0930   ALKPHOS 57 06/17/2012 0930   BILITOT 0.4 06/17/2012 0930   GFRNONAA 23* 06/18/2012 0425   GFRAA 27* 06/18/2012 0425    Assessment and Plan: 1. Code Status: Full code 2. Symptom:        -seizure, dysphagia, weakness: managed by CCM at this  time 3. Psycho/Social: Support offered to sister Tyler Aas, we spoke by telephone, see above 4. Spiritual: Chaplain services consulted 5. Disposition: dependant on outcomes,   Patient Documents Completed or Given: Document Given Completed  Advanced Directives Pkt    MOST    DNR    Gone from [facility]    Hard Choices  yes   PMT will continue to support holistically.  Discussed with Dr Molli Knock, recommended contact with Dr Tyson Alias, unable to  Contact directly  (after hrs) will contact him  in morning  Lorinda Creed NP  Palliative Medicine Team  Team Phone # [phone] Pager [phone]   1

## 2012-06-18 NOTE — Progress Notes (Signed)
EEG COMPLETED AT BEDSIDE  

## 2012-06-18 NOTE — Progress Notes (Signed)
Hypertension and tachycardia; currently on propofol, dilantin and vimpat.   I am concerned that he has seizures; increase propofol to 50, EEG ordered.

## 2012-06-18 NOTE — Progress Notes (Signed)
OT/PT Cancellation Note  Patient Details Name: Robert Mcclure MRN: [id number] DOB: 07-17-1945   Cancelled Treatment:    Reason Eval/Treat Not Completed: Medical issues which prohibited therapy.  Pt seizing, sedated and reintubated.  OT/PT will sign off at this time.  Please reorder both OT/PT when appropriate. 06/18/2012  [place] Bing, PT [phone] (pager)  [facility], OTR/L  [phone] 06/18/2012    Mottinger, Eliseo Gum 06/18/2012, 11:07 AM

## 2012-06-18 NOTE — Procedures (Signed)
EEG report.  Brief clinical history:  67 years old male with a history of symptomatic post stroke seizures recently admitted to the hospital with GTC SE that resolved. Today some concerns about ongoing seizures. On dilantin, vimpat, and keppra. Technique: this is a 17 channel routine scalp EEG performed at the bedside in the ICU setting with bipolar and monopolar montages arranged in accordance to the international 10/20 system of electrode placement. One channel was dedicated to EKG recording.  Patient is intubated on the vent. Intermittent photic stimulation was utilized as activating procedure.  Description: As the study begins and throughout the entire recording, there is generalized, continuous, low to medium amplitude 5-6 Hz activity that doesn't follow an ictal pattern at any time and has no intermixed focal or generalized epileptiform discharges. Intermittent photic stimulation did not induce a driving response.  EKG showed sinus rhythm.  Impression: this is an abnormal EEG with findings consistent with an encephalopathy, non specific as to cause. No electrographic seizures noted. Clinical correlation is advised.  [name], MD

## 2012-06-18 NOTE — Progress Notes (Signed)
Name: Robert Mcclure MRN: [id number] DOB: [birth year]    LOS: 16  Referring Provider:  [name] NP Reason for Referral:  Hypoxic respiratory failure  PULMONARY / CRITICAL CARE MEDICINE    Brief patient description:  67 y/o with CVA, seizure d/o and HTN, intubated for respiratory failure following witnessed aspiration event. He was admitted on 05/30/2012 to the ICU on the PCCM service in status epilepticus (required intubation for airway protection). He was extubated on 2/19 and transferred to the hospitalist service on 2/21. He has had trouble with blood pressure management since transfer. He has also required an NG tube for medications and feeding. Early in the morning on 3/2 (around 1:30 am). He vomited what appeared to be coffee grounds. He then developed respiratory distress   Lines: 3/2 ETT>>> 2/28 left  PICC>>>   ABX: vanc 3/2>>> Zosyn 3/2>>>  Micro: Sputum 3/4>>> Sputum 3/4>>> Urine 3/2>>> BC 3/2 x 4>>>  Events/ studies: 3/2 - vomited, reintubated  Events Since Admission: Calm, int tremors  Vital Signs: Temp:  [99.1 F (37.3 C)-100.8 F (38.2 C)] 99.7 F (37.6 C) (03/04 0800) Pulse Rate:  [95-128] 117 (03/04 0800) Resp:  [15-23] 15 (03/04 0800) BP: (91-177)/(54-104) 177/104 mmHg (03/04 0800) SpO2:  [96 %-100 %] 97 % (03/04 0800) FiO2 (%):  [40 %] 40 % (03/04 0800) Weight:  [90.2 kg (198 lb 13.7 oz)] 90.2 kg (198 lb 13.7 oz) (03/04 0500)  Physical Examination: General:  No tremors now Neuro:  Sedated rass -1 rt flaccid old HEENT: jvd wnl Neck:  Supple, no masses, jvd wnl Cardiovascular:  NRRR, no mrg Lungs:  mild coarse bases Abdomen:  Soft, NTND, +BS Musculoskeletal:  No joint abnormalities Skin:  No rashes   Principal Problem:   Acute respiratory failure Active Problems:   HTN (hypertension), malignant   CVA (cerebral vascular accident)   Diabetes mellitus   Aspiration pneumonia   Encephalopathy, metabolic   Dysphagia, unspecified  Altered mental status   Weakness generalized   ASSESSMENT AND PLAN  PULMONARY  Recent Labs Lab 06/16/12 0135 06/16/12 0229  PHART 7.405 7.360  PCO2ART 36.8 40.4  PO2ART 60.4* 218.0*  HCO3 22.3 21.7  O2SAT 90.8 99.4   Ventilator Settings: Vent Mode:  [-] PRVC FiO2 (%):  [40 %] 40 % Set Rate:  [20 bmp] 20 bmp Vt Set:  [620 mL] 620 mL PEEP:  [5 cmH20] 5 cmH20 Plateau Pressure:  [11 cmH20-31 cmH20] 31 cmH20 Dg Chest Port 1 View  06/18/2012  *RADIOLOGY REPORT*  Clinical Data: Check ETT  PORTABLE CHEST - 1 VIEW  Comparison: 06/17/2012  Findings: Endotracheal tube terminates 2 cm above the carina.  Enteric tube courses below the diaphragm.  Stable left arm PICC. Patchy opacities within the left perihilar region and bilateral lower lobes, suspicious for pneumonia.  Superimposed mild interstitial edema is possible.  Small left pleural effusion.  No pneumothorax.  Cardiomegaly.  IMPRESSION: Endotracheal tube terminates 2 cm above the carina.  Patchy opacities within the left perihilar region and bilateral lower lobes, suspicious for pneumonia.  Small left pleural effusion.   Original Report Authenticated By: Charline Bills, M.D.    Dg Chest Port 1 View  06/17/2012  *RADIOLOGY REPORT*  Clinical Data: Respiratory failure  PORTABLE CHEST - 1 VIEW  Comparison: 06/16/2012  Findings: Endotracheal tube tip approximately 1.5 cm above the carina.  Some improvement in aeration of the left lower lobe but is identified with persistent atelectasis/infiltrates remaining in both lower lung zones.  There likely is  a small left pleural effusion.  No overt edema identified.  The heart size is stable.  IMPRESSION: Some improvement in aeration of the left lower lobe.  Persistent bilateral lower lobe atelectasis/infiltrates.   Original Report Authenticated By: [name], M.D.    PULM pcxr- bilat infiltrates, int , ett wnl  A:  Aspiration pneumonitis Acute respiratory failure P:   Weaning cpap5 , requires  PS 12 pcxr slight increase infiltrate eval T V, and reduce PS if able Will discuss trach option with family  CARDIOVASCULAR  Recent Labs Lab 06/16/12 0900  PROBNP 3241.0*   ECG:  Sinus tachycardia Lines: left picc line  A: HTN int, r/o seziures P:  Tele BP wnl Treat benzo with HTN, eeg reordered  RENAL  Recent Labs Lab 06/13/12 1627 06/14/12 0432 06/15/12 0500 06/16/12 0255 06/16/12 2120 06/17/12 0500 06/17/12 0930 06/18/12 0425  NA  --  146* 144 139 141  --  141 139  K  --  3.6 3.7 3.8 4.5  --  3.7 3.9  CL  --  113* 111 106 105  --  104 103  CO2  --  22 24 24 21   --  18* 21  BUN  --  29* 26* 25* 38*  --  47* 53*  CREATININE  --  1.77* 1.78* 1.78* 2.89*  --  3.09* 2.68*  CALCIUM  --  8.4 8.2* 7.8* 7.7*  --  7.9* 7.9*  MG 2.1 2.2  --   --  2.0 2.0  --   --   PHOS  --   --   --   --   --  4.8*  --   --    Intake/Output     03/03 0701 - 03/04 0700 03/04 0701 - 03/05 0700   I.V. (mL/kg) 1060.4 (11.8) 20 (0.2)   NG/GT 750 20   IV Piggyback 470    Total Intake(mL/kg) 2280.4 (25.3) 40 (0.4)   Urine (mL/kg/hr) 740 (0.3) 85 (0.4)   Emesis/NG output     Total Output 740 85   Net +1540.4 -45         Foley:  In place  A:  CKD, ARF, pre renal component P:   Continue to allow pos balance, fluids at 50 cc/hr Chem in am   GASTROINTESTINAL  Recent Labs Lab 06/13/12 0500 06/15/12 0500 06/16/12 2120 06/17/12 0930 06/18/12 0823  AST 24 30 26 25   --   ALT 30 33 25 23  --   ALKPHOS 66 87 65 57  --   BILITOT 0.4 0.5 0.5 0.4  --   PROT 5.7* 6.0 5.9* 5.8*  --   ALBUMIN 2.4* 2.6* 2.1* 2.0* 2.0*    A:  Failed modified barium swallow, dysphagia, no coffee grounds noted now, vomiting, r/o ileus P:   Will likely need peg KUB now Add reglan ecg in am for qtc, this qtc now wnl TF restart after reglan  HEMATOLOGIC  Recent Labs Lab 06/15/12 0500 06/16/12 0255 06/16/12 2120 06/17/12 0500 06/18/12 0425  HGB 9.5* 9.5* 9.0* 8.4* 7.6*  HCT 29.0* 28.7* 27.4*  26.1* 22.7*  PLT 205 242 208 205 222   A:  Anemia, dvt prevention, dilutional anemia likely P:  scd Cbc in am   INFECTIOUS  Recent Labs Lab 06/15/12 0500 06/16/12 0255 06/16/12 2120 06/17/12 0500 06/18/12 0425  WBC 8.4 5.9 9.6 10.9* 10.0   Cultures: 3-2 sputum>> 3-2 bc x 2>> 3-2 uc>> Antibiotics: 3-2 vanc>> 3-2 zoysn>>  A:  Monitor for signs of developing pneumonia, rt hilum? P:   No mrsa, dc vanc No pseudomonas, dc zosyn Add ceftriaxone Sputum eval just stain pos  ENDOCRINE  Recent Labs Lab 06/17/12 0708 06/17/12 1129 06/17/12 1512 06/17/12 2348 06/18/12 0423  GLUCAP 131* 103* 91 194* 160*   A:  DM   P:   q4h accuchecks and ssi  NEUROLOGIC  A:  S/p CVA, seizure d/o, dysphagia P:   Continue dilantin, lacosamide Sedation with propofol and fentanyl - tolerating this WUA Repeat eeg consideration with clinical picture Benzo if tachy, htn with tremor Dil level with correction 19  Global: Ileus, wean, high PS needed, consider trach wed  BEST PRACTICE / DISPOSITION Level of Care:  ICU Primary Service:  PCCM Consultants:  Palliative care, neurology Code Status:  full Diet:  Tube feeds DVT Px:  scd GI Px:  PPI Skin Integrity:  good Social / Family:    Ccm time 30 min   Mcarthur Rossetti. Tyson Alias, MD, FACP Pgr: [phone] [facility]

## 2012-06-19 ENCOUNTER — Inpatient Hospital Stay (HOSPITAL_COMMUNITY): Payer: PRIVATE HEALTH INSURANCE

## 2012-06-19 LAB — BASIC METABOLIC PANEL
BUN: 47 mg/dL — ABNORMAL HIGH (ref 6–23)
CO2: 22 mEq/L (ref 19–32)
Chloride: 108 mEq/L (ref 96–112)
Glucose, Bld: 177 mg/dL — ABNORMAL HIGH (ref 70–99)
Potassium: 4 mEq/L (ref 3.5–5.1)

## 2012-06-19 LAB — GLUCOSE, CAPILLARY
Glucose-Capillary: 131 mg/dL — ABNORMAL HIGH (ref 70–99)
Glucose-Capillary: 156 mg/dL — ABNORMAL HIGH (ref 70–99)

## 2012-06-19 LAB — CULTURE, RESPIRATORY W GRAM STAIN: Special Requests: NORMAL

## 2012-06-19 LAB — TRIGLYCERIDES: Triglycerides: 342 mg/dL — ABNORMAL HIGH (ref <150)

## 2012-06-19 MED ORDER — LABETALOL HCL 100 MG PO TABS
100.0000 mg | ORAL_TABLET | Freq: Two times a day (BID) | ORAL | Status: DC
  Administered 2012-06-19 – 2012-06-20 (×4): 100 mg via ORAL
  Filled 2012-06-19 (×6): qty 1

## 2012-06-19 MED ORDER — SODIUM CHLORIDE 0.9 % IV SOLN
3.0000 g | Freq: Three times a day (TID) | INTRAVENOUS | Status: DC
  Administered 2012-06-19 – 2012-06-23 (×12): 3 g via INTRAVENOUS
  Filled 2012-06-19 (×14): qty 3

## 2012-06-19 MED ORDER — FUROSEMIDE 10 MG/ML IJ SOLN
40.0000 mg | Freq: Two times a day (BID) | INTRAMUSCULAR | Status: DC
  Administered 2012-06-19 (×2): 40 mg via INTRAVENOUS
  Filled 2012-06-19 (×4): qty 4

## 2012-06-19 MED ORDER — CLONIDINE HCL 0.2 MG PO TABS
0.2000 mg | ORAL_TABLET | Freq: Three times a day (TID) | ORAL | Status: DC
  Administered 2012-06-19 – 2012-06-22 (×10): 0.2 mg via ORAL
  Filled 2012-06-19 (×13): qty 1

## 2012-06-19 NOTE — Progress Notes (Signed)
NUTRITION FOLLOW UP  Intervention:    Increase Jevity 1.2 by 10 ml every 8 hours to goal rate of 65 ml/h with Prostat 30 ml TID to provide 2172 kcals, 132 gm protein, 1264 ml free water daily.  Nutrition Dx:   Inadequate oral intake related to inability to eat as evidenced by NPO status. Ongoing.  Goal:   Intake to meet >90% of estimated nutrition needs, unmet, but progressing.  Monitor:    TF tolerance/adequacy, vent status, weight trend, labs.  Assessment:   Family meeting with patient's family today; no plans for trach or PEG under any circumstances.  Plans to NOT perform resuscitation if arrest but to continue current medical support for now.  Reglan was added yesterday to help stimulate gut function.  No vomiting or NGT output since yesterday afternoon.  Tolerating TF at 20 ml/h currently.    Patient remains intubated on ventilator support.  MV: 13.9 Temp:Temp (24hrs), Avg:101 F (38.3 C), Min:100.2 F (37.9 C), Max:101.7 F (38.7 C)   Height: Ht Readings from Last 1 Encounters:  06/17/2012 6' (1.829 m)    Weight Status:   Wt Readings from Last 1 Encounters:  06/19/12 208 lb 1.8 oz (94.4 kg)  06/17/12  198 lb 3.1 oz (89.9 kg)  06/14/12  193 lb 2 oz (87.6 kg)  06/10/12  196 lb 9.6 oz (89.177 kg)  Admit weight: June 23, 2012 - 206 lbs   Weight is trending back up, likely related to fluids.  Body mass index is 28.22 kg/(m^2).  Re-estimated needs:  Kcal: 2305 Protein: 120-140 gm Fluid: 2.3 L  Skin: Intact  Diet Order: NPO    Last BM: unsure  Labs:   Recent Labs Lab 06/14/12 0432  06/16/12 2120 06/17/12 0500 06/17/12 0930 06/18/12 0425 06/19/12 0443  NA 146*  < > 141  --  141 139 144  K 3.6  < > 4.5  --  3.7 3.9 4.0  CL 113*  < > 105  --  104 103 108  CO2 22  < > 21  --  18* 21 22  BUN 29*  < > 38*  --  47* 53* 47*  CREATININE 1.77*  < > 2.89*  --  3.09* 2.68* 1.97*  CALCIUM 8.4  < > 7.7*  --  7.9* 7.9* 8.0*  MG 2.2  --  2.0 2.0  --   --   --   PHOS   --   --   --  4.8*  --   --   --   GLUCOSE 171*  < > 114*  --  129* 155* 177*  < > = values in this interval not displayed.  CBG (last 3)   Recent Labs  06/19/12 0420 06/19/12 0826 06/19/12 1230  GLUCAP 156* 107* 136*    Scheduled Meds: . albuterol  8 puff Inhalation Q4H  . ampicillin-sulbactam (UNASYN) IV  3 g Intravenous Q8H  . antiseptic oral rinse  15 mL Mouth Rinse QID  . chlorhexidine  15 mL Mouth Rinse BID  . cloNIDine  0.2 mg Oral TID  . feeding supplement  30 mL Per Tube TID  . furosemide  40 mg Intravenous Q12H  . insulin aspart  0-9 Units Subcutaneous Q4H  . ipratropium  6 puff Inhalation Q4H  . labetalol  100 mg Oral BID  . lacosamide (VIMPAT) IV  200 mg Intravenous Q12H  . levETIRAcetam  500 mg Intravenous Q12H  . metoCLOPramide (REGLAN) injection  5 mg Intravenous Q6H  .  pantoprazole (PROTONIX) IV  40 mg Intravenous Q12H  . phenytoin (DILANTIN) IV  100 mg Intravenous Q8H  . sodium chloride  10-40 mL Intracatheter Q12H    Continuous Infusions: . sodium chloride 20 mL/hr at 06/19/12 1117  . feeding supplement (JEVITY 1.2 CAL) 1,000 mL (06/17/12 1642)  . fentaNYL infusion INTRAVENOUS 150 mcg/hr (06/19/12 1200)     Joaquin Courts, RD, LDN, CNSC Pager# [phone] After Hours Pager# [phone]

## 2012-06-19 NOTE — Progress Notes (Signed)
Name: Robert Mcclure MRN: [id number] DOB: 1946-04-17    LOS: 17  Referring [place] Nies:  [name] NP Reason for Referral:  Hypoxic respiratory failure  PULMONARY / CRITICAL CARE MEDICINE    Brief patient description:  67 y/o with CVA, seizure d/o and HTN, intubated for respiratory failure following witnessed aspiration event. He was admitted on 05/28/2012 to the ICU on the PCCM service in status epilepticus (required intubation for airway protection). He was extubated on 2/19 and transferred to the hospitalist service on 2/21. He has had trouble with blood pressure management since transfer. He has also required an NG tube for medications and feeding. Early in the morning on 3/2 (around 1:30 am). He vomited what appeared to be coffee grounds. He then developed respiratory distress   Lines: 3/2 ETT>>> 2/28 left  PICC>>>   ABX: vanc 3/2>>>3/4 Zosyn 3/2>>>3/4 Linazolid 3/4>>> Amp 3/4>>>  Micro: Sputum 3/2>>>amp sens enteroccus Sputum 3/2>>>same Urine 3/2>>> BC 3/2 x 4>>>  Events/ studies: 3/2 - vomited, reintubated 3/4- eeg neg focus, enceph 3/5- family meeting  Events Since Admission: eeg neg  Vital Signs: Temp:  [100.2 F (37.9 C)-101.7 F (38.7 C)] 101 F (38.3 C) (03/05 0800) Pulse Rate:  [100-125] 125 (03/05 0830) Resp:  [8-25] 25 (03/05 0830) BP: (124-192)/(63-108) 188/92 mmHg (03/05 0830) SpO2:  [96 %-100 %] 100 % (03/05 0830) FiO2 (%):  [40 %] 40 % (03/05 0800) Weight:  [94.4 kg (208 lb 1.8 oz)] 94.4 kg (208 lb 1.8 oz) (03/05 0500)  Physical Examination: General:  No tremors Neuro:  Sedated rass -4 (deep) versed just off rt flaccid old HEENT: jvd wnl Neck:  Supple, no masses, jvd wnl Cardiovascular:  s1 s2 RRR, no mrg Lungs: improved to clear Abdomen:  Soft, NTND, +BS Musculoskeletal:  No joint abnormalities Skin:  No rashes   Principal Problem:   Acute respiratory failure Active Problems:   HTN (hypertension), malignant   CVA (cerebral  vascular accident)   Diabetes mellitus   Aspiration pneumonia   Encephalopathy, metabolic   Dysphagia, unspecified   Altered mental status   Weakness generalized   ASSESSMENT AND PLAN  PULMONARY  Recent Labs Lab 06/16/12 0135 06/16/12 0229  PHART 7.405 7.360  PCO2ART 36.8 40.4  PO2ART 60.4* 218.0*  HCO3 22.3 21.7  O2SAT 90.8 99.4   Ventilator Settings: Vent Mode:  [-] PSV;CPAP FiO2 (%):  [40 %] 40 % Set Rate:  [20 bmp] 20 bmp Vt Set:  [620 mL] 620 mL PEEP:  [5 cmH20] 5 cmH20 Pressure Support:  [5 cmH20-10 cmH20] 10 cmH20 Plateau Pressure:  [16 cmH20-29 cmH20] 16 cmH20 Dg Chest Port 1 View  06/19/2012  *RADIOLOGY REPORT*  Clinical Data: Assess ETT tube  PORTABLE CHEST - 1 VIEW  Comparison: 06/18/2012  Findings: Increased interstitial markings, possibly reflecting mild interstitial edema.  Mild patchy left lower lobe opacity, atelectasis versus pneumonia.  Small left pleural effusion. No pneumothorax.  Endotracheal tube terminates 1.5 cm above the carina.  Stable enteric tube coursing below the diaphragm.  Stable left arm PICC.  Mild cardiomegaly  IMPRESSION: Endotracheal tube terminates 1.5 cm above the carina.  Cardiomegaly with suspected mild interstitial edema.  Small left pleural effusion.  Mild left lower lobe opacity, atelectasis versus pneumonia.   Original Report Authenticated By: Charline Bills, M.D.    Dg Chest Port 1 View  06/18/2012  *RADIOLOGY REPORT*  Clinical Data: Check ETT  PORTABLE CHEST - 1 VIEW  Comparison: 06/17/2012  Findings: Endotracheal tube terminates 2 cm above the  carina.  Enteric tube courses below the diaphragm.  Stable left arm PICC. Patchy opacities within the left perihilar region and bilateral lower lobes, suspicious for pneumonia.  Superimposed mild interstitial edema is possible.  Small left pleural effusion.  No pneumothorax.  Cardiomegaly.  IMPRESSION: Endotracheal tube terminates 2 cm above the carina.  Patchy opacities within the left  perihilar region and bilateral lower lobes, suspicious for pneumonia.  Small left pleural effusion.   Original Report Authenticated By: Charline Bills, M.D.    Dg Abd Portable 1v  06/18/2012  *RADIOLOGY REPORT*  Clinical Data: Abdominal distention.  PORTABLE ABDOMEN - 1 VIEW  Comparison: 06/15/2012.  Findings: The NG tube is in the fundal region of the stomach and the proximal port is in the distal esophagus.  This could be advanced several centimeters.  Moderate air in the colon and scattered air in the small bowel suggests a mild ileus.  No free air or findings for small bowel obstruction.  The soft tissue shadows are maintained.  The bony structures are intact.  IMPRESSION:  1.  The NG tube tip is in the fundal region of the stomach just below the GE junction.  The proximal port is in the distal esophagus.  This could be advanced several centimeters. 2.  Mild diffuse ileus bowel gas pattern.   Original Report Authenticated By: Rudie Meyer, M.D.    PULM pcxr- bilat infiltrates  Improved hilum  A:  Aspiration pneumonitis Acute respiratory failure P:   Wean with sedation WUA, PS 5 required, cpap 5 -d/w family to consider comfort approach vs trach -pcxr improved, consider neg balance slight  CARDIOVASCULAR  Recent Labs Lab 06/16/12 0900  PROBNP 3241.0*   ECG:  Sinus tachycardia Lines: left picc line  A: HTN int, r/o seziures P:  Tele BP wnl eeg neg focus labetolol increase, dc metoprolol ( ineffective) Clonidine patch change to tube  RENAL  Recent Labs Lab 06/13/12 1627 06/14/12 0432  06/16/12 0255 06/16/12 2120 06/17/12 0500 06/17/12 0930 06/18/12 0425 06/19/12 0443  NA  --  146*  < > 139 141  --  141 139 144  K  --  3.6  < > 3.8 4.5  --  3.7 3.9 4.0  CL  --  113*  < > 106 105  --  104 103 108  CO2  --  22  < > 24 21  --  18* 21 22  BUN  --  29*  < > 25* 38*  --  47* 53* 47*  CREATININE  --  1.77*  < > 1.78* 2.89*  --  3.09* 2.68* 1.97*  CALCIUM  --  8.4  < > 7.8*  7.7*  --  7.9* 7.9* 8.0*  MG 2.1 2.2  --   --  2.0 2.0  --   --   --   PHOS  --   --   --   --   --  4.8*  --   --   --   < > = values in this interval not displayed. Intake/Output     03/04 0701 - 03/05 0700 03/05 0701 - 03/06 0700   I.V. (mL/kg) 1461.2 (15.5) 64.7 (0.7)   NG/GT 290 40   IV Piggyback 940    Total Intake(mL/kg) 2691.2 (28.5) 104.7 (1.1)   Urine (mL/kg/hr) 1520 (0.7) 125 (0.4)   Emesis/NG output 150 (0.1)    Total Output 1670 125   Net +1021.2 -20.3  Foley:  In place  A:  CKD, ARF, continue pos balance, atn likely as well P:   kvo Lasix q12h Chem in am   GASTROINTESTINAL  Recent Labs Lab 06/13/12 0500 06/15/12 0500 06/16/12 2120 06/17/12 0930 06/18/12 0823  AST 24 30 26 25   --   ALT 30 33 25 23  --   ALKPHOS 66 87 65 57  --   BILITOT 0.4 0.5 0.5 0.4  --   PROT 5.7* 6.0 5.9* 5.8*  --   ALBUMIN 2.4* 2.6* 2.1* 2.0* 2.0*    A:  Failed modified barium swallow, dysphagia, no coffee grounds noted now, vomiting, ileus P:   Improved with reglan Re increase TF to goal PEG if aggressive  HEMATOLOGIC  Recent Labs Lab 06/16/12 0255 06/16/12 2120 06/17/12 0500 06/18/12 0425 06/18/12 1150  HGB 9.5* 9.0* 8.4* 7.6* 7.7*  HCT 28.7* 27.4* 26.1* 22.7* 22.9*  PLT 242 208 205 222 238  INR  --   --   --   --  1.32  APTT  --   --   --   --  41*   A:  Anemia, dvt prevention, dilutional anemia likely P:  scd Cbc in am , after lasix  INFECTIOUS  Recent Labs Lab 06/16/12 0255 06/16/12 2120 06/17/12 0500 06/18/12 0425 06/18/12 1150  WBC 5.9 9.6 10.9* 10.0 11.7*   A:  enteroccal asp pNA P:   Dc zyvoxx Add amp consider 8 days  ENDOCRINE  Recent Labs Lab 06/18/12 1550 06/18/12 1949 06/18/12 2352 06/19/12 0420 06/19/12 0826  GLUCAP 164* 156* 131* 156* 107*   A:  DM   P:   q4h accuchecks and ssi  NEUROLOGIC  A:  S/p CVA, seizure d/o, dysphagia, eeg neg 3/4 P:   Continue dilantin, lacosamide Dc versed drip Neuro  following WUA Propofol must dc, trig up  Global: treating ileus, weaning, family meeting  BEST PRACTICE / DISPOSITION Level of Care:  ICU Primary Service:  PCCM Consultants:  Palliative care (d/w them today), neurology Code Status:  full Diet:  Tube feeds DVT Px:  scd GI Px:  PPI Skin Integrity:  good Social / Family:  Scheduled meeting at 1 pm  Ccm time 30 min   Mcarthur Rossetti. Tyson Alias, MD, FACP Pgr: [phone] [place]

## 2012-06-19 NOTE — Progress Notes (Signed)
Chaplain Note:  Chaplain visited with pt, pt's family, nurses, and physician during a meeting to establish a care plan for the pt.  Pt was in bed, intubated, sedated, and did not communicate during this visit.  Chaplain provided spiritual comfort and support for pt as they made end of life decisions on pt's behalf.  Pt's family and clinical staff expressed appreciation for chaplain support.  Chaplain will follow up as needed.  06/19/12 1300  Clinical Encounter Type  Visited With Patient and family together;Health care provider  Visit Type Initial;Spiritual support;Other (Comment) (Family meeting with physcian)  Spiritual Encounters  Spiritual Needs Emotional  Stress Factors  Patient Stress Factors Major life changes;Health changes  Family Stress Factors Major life changes  Verdie Shire, [place] [phone]

## 2012-06-19 NOTE — Progress Notes (Signed)
I have had extensive discussions with family son, daughter,med poa sister. We discussed patients current circumstances and organ failures. We also discussed patient's prior wishes under circumstances such as this. Family has decided to NOT perform resuscitation if arrest but to continue current medical support for now. Will extubate around 72 hrs and continue medical support if thriving or comfort if fail  No trach , no peg under any circumstances per family  Mcarthur Rossetti. Tyson Alias, MD, FACP Pgr: [phone] [place] [facility]

## 2012-06-19 NOTE — Progress Notes (Signed)
Subjective: No recurrence of seizure activity reported. EEG on 06/18/2012 showed no ongoing seizure activity. Findings were consistent with generalized encephalopathic process. Dilantin level, corrected, was within normal therapeutic range on 06/18/2012. Patient is also on Vimpat and Keppra for seizure management.  Objective: Current vital signs: BP 188/92  Pulse 125  Temp(Src) 101 F (38.3 C) (Core (Comment))  Resp 25  Ht 6' (1.829 m)  Wt 94.4 kg (208 lb 1.8 oz)  BMI 28.22 kg/m2  SpO2 100%  Neurologic Exam: On mechanical ventilation, intubated. Patient is also febrile and on a cooling blanket. Opens eyes to verbal and tactile sensation. He blinks to visual threat but does not obey simple commands. Extraocular movements were intact to oculocephalic maneuvers. Mild right exotropia noted. Muscle tone was flaccid throughout. Patient had no spontaneous movements and no abnormal posturing. Deep tendon reflexes were 1+ in upper extremities and at the knees and 3+ at the ankles.  Lab Results: Results for orders placed during the hospital encounter of 05/25/2012 (from the past 48 hour(s))  GLUCOSE, CAPILLARY     Status: Abnormal   Collection Time    06/17/12 11:29 AM      Result Value Range   Glucose-Capillary 103 (*) 70 - 99 mg/dL  GLUCOSE, CAPILLARY     Status: None   Collection Time    06/17/12  3:12 PM      Result Value Range   Glucose-Capillary 91  70 - 99 mg/dL   Comment 1 Notify RN     Comment 2 Documented in Chart    GLUCOSE, CAPILLARY     Status: Abnormal   Collection Time    06/17/12  7:50 PM      Result Value Range   Glucose-Capillary 138 (*) 70 - 99 mg/dL   Comment 1 Notify RN    GLUCOSE, CAPILLARY     Status: Abnormal   Collection Time    06/17/12 11:48 PM      Result Value Range   Glucose-Capillary 194 (*) 70 - 99 mg/dL   Comment 1 Documented in Chart     Comment 2 Notify RN    GLUCOSE, CAPILLARY     Status: Abnormal   Collection Time    06/18/12  4:23 AM       Result Value Range   Glucose-Capillary 160 (*) 70 - 99 mg/dL   Comment 1 Documented in Chart     Comment 2 Notify RN    BASIC METABOLIC PANEL     Status: Abnormal   Collection Time    06/18/12  4:25 AM      Result Value Range   Sodium 139  135 - 145 mEq/L   Potassium 3.9  3.5 - 5.1 mEq/L   Chloride 103  96 - 112 mEq/L   CO2 21  19 - 32 mEq/L   Glucose, Bld 155 (*) 70 - 99 mg/dL   BUN 53 (*) 6 - 23 mg/dL   Creatinine, Ser 1.61 (*) 0.50 - 1.35 mg/dL   Calcium 7.9 (*) 8.4 - 10.5 mg/dL   GFR calc non Af Amer 23 (*) >90 mL/min   GFR calc Af Amer 27 (*) >90 mL/min   Comment:            The eGFR has been calculated     using the CKD EPI equation.     This calculation has not been     validated in all clinical     situations.     eGFR's persistently     <  90 mL/min signify     possible Chronic Kidney Disease.  CBC WITH DIFFERENTIAL     Status: Abnormal   Collection Time    06/18/12  4:25 AM      Result Value Range   WBC 10.0  4.0 - 10.5 K/uL   RBC 2.38 (*) 4.22 - 5.81 MIL/uL   Hemoglobin 7.6 (*) 13.0 - 17.0 g/dL   HCT 96.0 (*) 45.4 - 09.8 %   MCV 95.4  78.0 - 100.0 fL   MCH 31.9  26.0 - 34.0 pg   MCHC 33.5  30.0 - 36.0 g/dL   RDW 11.9  14.7 - 82.9 %   Platelets 222  150 - 400 K/uL   Neutrophils Relative 84 (*) 43 - 77 %   Neutro Abs 8.4 (*) 1.7 - 7.7 K/uL   Lymphocytes Relative 8 (*) 12 - 46 %   Lymphs Abs 0.8  0.7 - 4.0 K/uL   Monocytes Relative 6  3 - 12 %   Monocytes Absolute 0.6  0.1 - 1.0 K/uL   Eosinophils Relative 2  0 - 5 %   Eosinophils Absolute 0.2  0.0 - 0.7 K/uL   Basophils Relative 0  0 - 1 %   Basophils Absolute 0.0  0.0 - 0.1 K/uL  GLUCOSE, CAPILLARY     Status: Abnormal   Collection Time    06/18/12  7:43 AM      Result Value Range   Glucose-Capillary 170 (*) 70 - 99 mg/dL  PHENYTOIN LEVEL, TOTAL     Status: Abnormal   Collection Time    06/18/12  8:23 AM      Result Value Range   Phenytoin Lvl 9.8 (*) 10.0 - 20.0 ug/mL  ALBUMIN     Status: Abnormal    Collection Time    06/18/12  8:23 AM      Result Value Range   Albumin 2.0 (*) 3.5 - 5.2 g/dL  APTT     Status: Abnormal   Collection Time    06/18/12 11:50 AM      Result Value Range   aPTT 41 (*) 24 - 37 seconds   Comment:            IF BASELINE aPTT IS ELEVATED,     SUGGEST PATIENT RISK ASSESSMENT     BE USED TO DETERMINE APPROPRIATE     ANTICOAGULANT THERAPY.  PROTIME-INR     Status: Abnormal   Collection Time    06/18/12 11:50 AM      Result Value Range   Prothrombin Time 16.1 (*) 11.6 - 15.2 seconds   INR 1.32  0.00 - 1.49  CBC WITH DIFFERENTIAL     Status: Abnormal   Collection Time    06/18/12 11:50 AM      Result Value Range   WBC 11.7 (*) 4.0 - 10.5 K/uL   RBC 2.42 (*) 4.22 - 5.81 MIL/uL   Hemoglobin 7.7 (*) 13.0 - 17.0 g/dL   HCT 56.2 (*) 13.0 - 86.5 %   MCV 94.6  78.0 - 100.0 fL   MCH 31.8  26.0 - 34.0 pg   MCHC 33.6  30.0 - 36.0 g/dL   RDW 78.4  69.6 - 29.5 %   Platelets 238  150 - 400 K/uL   Neutrophils Relative 79 (*) 43 - 77 %   Neutro Abs 9.2 (*) 1.7 - 7.7 K/uL   Lymphocytes Relative 10 (*) 12 - 46 %   Lymphs Abs 1.2  0.7 - 4.0 K/uL   Monocytes Relative 9  3 - 12 %   Monocytes Absolute 1.0  0.1 - 1.0 K/uL   Eosinophils Relative 2  0 - 5 %   Eosinophils Absolute 0.2  0.0 - 0.7 K/uL   Basophils Relative 0  0 - 1 %   Basophils Absolute 0.0  0.0 - 0.1 K/uL  GLUCOSE, CAPILLARY     Status: Abnormal   Collection Time    06/18/12 12:37 PM      Result Value Range   Glucose-Capillary 178 (*) 70 - 99 mg/dL  GLUCOSE, CAPILLARY     Status: Abnormal   Collection Time    06/18/12  3:50 PM      Result Value Range   Glucose-Capillary 164 (*) 70 - 99 mg/dL  GLUCOSE, CAPILLARY     Status: Abnormal   Collection Time    06/18/12  7:49 PM      Result Value Range   Glucose-Capillary 156 (*) 70 - 99 mg/dL   Comment 1 Notify RN    GLUCOSE, CAPILLARY     Status: Abnormal   Collection Time    06/18/12 11:52 PM      Result Value Range   Glucose-Capillary 131 (*)  70 - 99 mg/dL   Comment 1 Documented in Chart     Comment 2 Notify RN    GLUCOSE, CAPILLARY     Status: Abnormal   Collection Time    06/19/12  4:20 AM      Result Value Range   Glucose-Capillary 156 (*) 70 - 99 mg/dL   Comment 1 Documented in Chart     Comment 2 Notify RN    BASIC METABOLIC PANEL     Status: Abnormal   Collection Time    06/19/12  4:43 AM      Result Value Range   Sodium 144  135 - 145 mEq/L   Potassium 4.0  3.5 - 5.1 mEq/L   Chloride 108  96 - 112 mEq/L   CO2 22  19 - 32 mEq/L   Glucose, Bld 177 (*) 70 - 99 mg/dL   BUN 47 (*) 6 - 23 mg/dL   Creatinine, Ser 3.08 (*) 0.50 - 1.35 mg/dL   Calcium 8.0 (*) 8.4 - 10.5 mg/dL   GFR calc non Af Amer 33 (*) >90 mL/min   GFR calc Af Amer 39 (*) >90 mL/min   Comment:            The eGFR has been calculated     using the CKD EPI equation.     This calculation has not been     validated in all clinical     situations.     eGFR's persistently     <90 mL/min signify     possible Chronic Kidney Disease.  TRIGLYCERIDES     Status: Abnormal   Collection Time    06/19/12  4:43 AM      Result Value Range   Triglycerides 342 (*) <150 mg/dL  GLUCOSE, CAPILLARY     Status: Abnormal   Collection Time    06/19/12  8:26 AM      Result Value Range   Glucose-Capillary 107 (*) 70 - 99 mg/dL    Studies/Results: Dg Chest Port 1 View  06/19/2012  *RADIOLOGY REPORT*  Clinical Data: Assess ETT tube  PORTABLE CHEST - 1 VIEW  Comparison: 06/18/2012  Findings: Increased interstitial markings, possibly reflecting mild interstitial edema.  Mild patchy left lower  lobe opacity, atelectasis versus pneumonia.  Small left pleural effusion. No pneumothorax.  Endotracheal tube terminates 1.5 cm above the carina.  Stable enteric tube coursing below the diaphragm.  Stable left arm PICC.  Mild cardiomegaly  IMPRESSION: Endotracheal tube terminates 1.5 cm above the carina.  Cardiomegaly with suspected mild interstitial edema.  Small left pleural  effusion.  Mild left lower lobe opacity, atelectasis versus pneumonia.   Original Report Authenticated By: Charline Bills, M.D.    Dg Chest Port 1 View  06/18/2012  *RADIOLOGY REPORT*  Clinical Data: Check ETT  PORTABLE CHEST - 1 VIEW  Comparison: 06/17/2012  Findings: Endotracheal tube terminates 2 cm above the carina.  Enteric tube courses below the diaphragm.  Stable left arm PICC. Patchy opacities within the left perihilar region and bilateral lower lobes, suspicious for pneumonia.  Superimposed mild interstitial edema is possible.  Small left pleural effusion.  No pneumothorax.  Cardiomegaly.  IMPRESSION: Endotracheal tube terminates 2 cm above the carina.  Patchy opacities within the left perihilar region and bilateral lower lobes, suspicious for pneumonia.  Small left pleural effusion.   Original Report Authenticated By: Charline Bills, M.D.    Dg Abd Portable 1v  06/18/2012  *RADIOLOGY REPORT*  Clinical Data: Abdominal distention.  PORTABLE ABDOMEN - 1 VIEW  Comparison: 06/15/2012.  Findings: The NG tube is in the fundal region of the stomach and the proximal port is in the distal esophagus.  This could be advanced several centimeters.  Moderate air in the colon and scattered air in the small bowel suggests a mild ileus.  No free air or findings for small bowel obstruction.  The soft tissue shadows are maintained.  The bony structures are intact.  IMPRESSION:  1.  The NG tube tip is in the fundal region of the stomach just below the GE junction.  The proximal port is in the distal esophagus.  This could be advanced several centimeters. 2.  Mild diffuse ileus bowel gas pattern.   Original Report Authenticated By: Rudie Meyer, M.D.     Medications:  I have reviewed the patient's current medications. Scheduled: . albuterol  8 puff Inhalation Q4H  . antiseptic oral rinse  15 mL Mouth Rinse QID  . chlorhexidine  15 mL Mouth Rinse BID  . cloNIDine  0.3 mg Transdermal Weekly  . feeding supplement   30 mL Per Tube TID  . insulin aspart  0-9 Units Subcutaneous Q4H  . ipratropium  6 puff Inhalation Q4H  . lacosamide (VIMPAT) IV  200 mg Intravenous Q12H  . levETIRAcetam  500 mg Intravenous Q12H  . linezolid  600 mg Intravenous Q12H  . metoCLOPramide (REGLAN) injection  5 mg Intravenous Q6H  . metoprolol  5 mg Intravenous Q6H  . pantoprazole (PROTONIX) IV  40 mg Intravenous Q12H  . phenytoin (DILANTIN) IV  100 mg Intravenous Q8H  . sodium chloride  10-40 mL Intracatheter Q12H   Continuous: . sodium chloride 50 mL/hr at 06/19/12 0700  . feeding supplement (JEVITY 1.2 CAL) 1,000 mL (06/17/12 1642)  . fentaNYL infusion INTRAVENOUS 75 mcg/hr (06/19/12 0830)  . midazolam (VERSED) infusion Stopped (06/19/12 0745)  . propofol 10 mcg/kg/min (06/19/12 0830)   BJY:NWGNFAOZHYQMV (TYLENOL) oral liquid 160 mg/5 mL, acetaminophen, chlorproMAZINE (THORAZINE) IV, fentaNYL, labetalol, LORazepam, ondansetron, sodium chloride  Assessment/Plan: Encephalopathic process most likely multifactorial in etiology, including metabolic and infectious abnormalities, as well as sedating medications. As no indication of ongoing seizure activity, including no EEG evidence of seizure activity.  No changes in current  management recommended, including continuing Dilantin, Vimpat and Keppra for seizure control.  C.R. Roseanne Reno, MD [facility]  [phone]  06/19/2012  10:15 AM

## 2012-06-20 ENCOUNTER — Inpatient Hospital Stay (HOSPITAL_COMMUNITY): Payer: PRIVATE HEALTH INSURANCE

## 2012-06-20 LAB — CBC WITH DIFFERENTIAL/PLATELET
Eosinophils Absolute: 0.1 10*3/uL (ref 0.0–0.7)
Eosinophils Relative: 1 % (ref 0–5)
Hemoglobin: 7.5 g/dL — ABNORMAL LOW (ref 13.0–17.0)
Lymphocytes Relative: 14 % (ref 12–46)
Lymphs Abs: 1.4 10*3/uL (ref 0.7–4.0)
MCH: 31.5 pg (ref 26.0–34.0)
MCV: 96.6 fL (ref 78.0–100.0)
Monocytes Relative: 10 % (ref 3–12)
RBC: 2.38 MIL/uL — ABNORMAL LOW (ref 4.22–5.81)

## 2012-06-20 LAB — GLUCOSE, CAPILLARY
Glucose-Capillary: 190 mg/dL — ABNORMAL HIGH (ref 70–99)
Glucose-Capillary: 208 mg/dL — ABNORMAL HIGH (ref 70–99)
Glucose-Capillary: 217 mg/dL — ABNORMAL HIGH (ref 70–99)

## 2012-06-20 LAB — BASIC METABOLIC PANEL
BUN: 57 mg/dL — ABNORMAL HIGH (ref 6–23)
CO2: 21 mEq/L (ref 19–32)
GFR calc non Af Amer: 35 mL/min — ABNORMAL LOW (ref >90)
Glucose, Bld: 252 mg/dL — ABNORMAL HIGH (ref 70–99)
Potassium: 3.8 mEq/L (ref 3.5–5.1)

## 2012-06-20 MED ORDER — FUROSEMIDE 10 MG/ML IJ SOLN
40.0000 mg | Freq: Four times a day (QID) | INTRAMUSCULAR | Status: DC
  Administered 2012-06-20 – 2012-06-21 (×4): 40 mg via INTRAVENOUS
  Filled 2012-06-20 (×6): qty 4

## 2012-06-20 NOTE — Progress Notes (Signed)
UR Completed.  Robert Mcclure [phone] 06/20/2012

## 2012-06-20 NOTE — Progress Notes (Signed)
Clinical Social Worker staffed case with PMT SW and MD.  CSW to continue to follow and assist as needed.   Angelia Mould, MSW, [place] [phone]

## 2012-06-20 NOTE — Progress Notes (Signed)
Name: Robert Mcclure MRN: [id number] DOB: [birth year]    LOS: 18  Referring Robert Mcclure:  Robert Mcgregor NP Reason for Referral:  Hypoxic respiratory failure  PULMONARY / CRITICAL CARE MEDICINE    Brief patient description:  67 y/o with CVA, seizure d/o and HTN, intubated for respiratory failure following witnessed aspiration event. He was admitted on 06/06/2012 to the ICU on the PCCM service in status epilepticus (required intubation for airway protection). He was extubated on 2/19 and transferred to the hospitalist service on 2/21. He has had trouble with blood pressure management since transfer. He has also required an NG tube for medications and feeding. Early in the morning on 3/2 (around 1:30 am). He vomited what appeared to be coffee grounds. He then developed respiratory distress   Lines: 3/2 ETT>>> 2/28 left  PICC>>>   ABX: vanc 3/2>>>3/4 Zosyn 3/2>>>3/4 Linazolid 3/4>>>3/5 Amp 3/4>>>  Micro: Sputum 3/2>>>amp sens enteroccus Sputum 3/2>>>same Urine 3/2>>> BC 3/2 x 4>>>  Events/ studies: 3/2 - vomited, reintubated 3/4- eeg neg focus, enceph 3/5- family meeting- NCB, no trach /peg option 3/6 eeg>>>  Events Since Admission: Am shacking rt  Vital Signs: Temp:  [100 F (37.8 C)-101.8 F (38.8 C)] 100.4 F (38 C) (03/06 0900) Pulse Rate:  [106-125] 125 (03/06 0900) Resp:  [13-27] 21 (03/06 0900) BP: (132-179)/(70-93) 167/93 mmHg (03/06 0900) SpO2:  [97 %-100 %] 98 % (03/06 0900) FiO2 (%):  [40 %] 40 % (03/06 0913)  Physical Examination: General:  Rt tremors Neuro:  Sedated rass -2, not following commands HEENT: jvd wnl Neck:  Supple, no masses, jvd wnl Cardiovascular:  s1 s2 RRR, no mrg Lungs: ronchi Abdomen:  Soft, NTND, +BS Musculoskeletal:  No joint abnormalities Skin:  No rashes   Principal Problem:   Acute respiratory failure Active Problems:   HTN (hypertension), malignant   CVA (cerebral vascular accident)   Diabetes mellitus   Aspiration  pneumonia   Encephalopathy, metabolic   Dysphagia, unspecified   Altered mental status   Weakness generalized   ASSESSMENT AND PLAN  PULMONARY  Recent Labs Lab 06/16/12 0135 06/16/12 0229  PHART 7.405 7.360  PCO2ART 36.8 40.4  PO2ART 60.4* 218.0*  HCO3 22.3 21.7  O2SAT 90.8 99.4   Ventilator Settings: Vent Mode:  [-] PRVC FiO2 (%):  [40 %] 40 % Set Rate:  [20 bmp] 20 bmp Vt Set:  [620 mL] 620 mL PEEP:  [5 cmH20] 5 cmH20 Pressure Support:  [10 cmH20] 10 cmH20 Plateau Pressure:  [22 cmH20-26 cmH20] 26 cmH20 Dg Chest Port 1 View  06/19/2012  *RADIOLOGY REPORT*  Clinical Data: Assess ETT tube  PORTABLE CHEST - 1 VIEW  Comparison: 06/18/2012  Findings: Increased interstitial markings, possibly reflecting mild interstitial edema.  Mild patchy left lower lobe opacity, atelectasis versus pneumonia.  Small left pleural effusion. No pneumothorax.  Endotracheal tube terminates 1.5 cm above the carina.  Stable enteric tube coursing below the diaphragm.  Stable left arm PICC.  Mild cardiomegaly  IMPRESSION: Endotracheal tube terminates 1.5 cm above the carina.  Cardiomegaly with suspected mild interstitial edema.  Small left pleural effusion.  Mild left lower lobe opacity, atelectasis versus pneumonia.   Original Report Authenticated By: Charline Bills, M.D.    PULM pcxr- bilat infiltrates  Improved hilum  A:  Aspiration pneumonitis Acute respiratory failure NO TRACH wished under any circumstance P:   Wean with sedation WUA, PS 5 required, cpap 5 - failed rapid Could consider repeat attempt in afternoon -consider lasix to neg balance  CARDIOVASCULAR  Recent Labs Lab 06/16/12 0900  PROBNP 3241.0*   ECG:  Sinus tachycardia Lines: left picc line  A: HTN way improved control, r/o seziures P:  Tele BP wnl labetolol increase Clonidine tube improved BP control  RENAL  Recent Labs Lab 06/13/12 1627 06/14/12 0432  06/16/12 2120 06/17/12 0500 06/17/12 0930 06/18/12 0425  06/19/12 0443 06/20/12 0410  NA  --  146*  < > 141  --  141 139 144 141  K  --  3.6  < > 4.5  --  3.7 3.9 4.0 3.8  CL  --  113*  < > 105  --  104 103 108 107  CO2  --  22  < > 21  --  18* 21 22 21   BUN  --  29*  < > 38*  --  47* 53* 47* 57*  CREATININE  --  1.77*  < > 2.89*  --  3.09* 2.68* 1.97* 1.91*  CALCIUM  --  8.4  < > 7.7*  --  7.9* 7.9* 8.0* 7.9*  MG 2.1 2.2  --  2.0 2.0  --   --   --   --   PHOS  --   --   --   --  4.8*  --   --   --   --   < > = values in this interval not displayed. Intake/Output     03/05 0701 - 03/06 0700 03/06 0701 - 03/07 0700   I.V. (mL/kg) 1475.5 (15.6) 37.5 (0.4)   NG/GT 960 110   IV Piggyback 250 45   Total Intake(mL/kg) 2685.5 (28.4) 192.5 (2)   Urine (mL/kg/hr) 1495 (0.7) 150 (0.5)   Emesis/NG output     Total Output 1495 150   Net +1190.5 +42.5         Foley:  In place  A:  CKD, ARF, pos balance again P:   kvo Lasix increase Chem in am   GASTROINTESTINAL  Recent Labs Lab 06/15/12 0500 06/16/12 2120 06/17/12 0930 06/18/12 0823  AST 30 26 25   --   ALT 33 25 23  --   ALKPHOS 87 65 57  --   BILITOT 0.5 0.5 0.4  --   PROT 6.0 5.9* 5.8*  --   ALBUMIN 2.6* 2.1* 2.0* 2.0*    A:  Failed modified barium swallow, dysphagia, ileus improved P:   Improved with reglan, can re reduce as no residuals TF to goal PEG not wished  HEMATOLOGIC  Recent Labs Lab 06/16/12 2120 06/17/12 0500 06/18/12 0425 06/18/12 1150 06/20/12 0410  HGB 9.0* 8.4* 7.6* 7.7* 7.5*  HCT 27.4* 26.1* 22.7* 22.9* 23.0*  PLT 208 205 222 238 237  INR  --   --   --  1.32  --   APTT  --   --   --  41*  --    A:  Anemia, dvt prevention, dilutional anemia likely P:  scd Lasix increase  INFECTIOUS  Recent Labs Lab 06/16/12 2120 06/17/12 0500 06/18/12 0425 06/18/12 1150 06/20/12 0410  WBC 9.6 10.9* 10.0 11.7* 9.8   A:  enteroccal asp pNA P:   Continue amp, stop date in place  ENDOCRINE  Recent Labs Lab 06/19/12 1605 06/19/12 2015  06/19/12 2348 06/20/12 0446 06/20/12 0729  GLUCAP 137* 167* 175* 221* 190*   A:  DM   P:   q4h accuchecks and ssi  NEUROLOGIC  A:  S/p CVA, seizure d/o, dysphagia, eeg neg 3/4,  prop off secondary to trig P:   Continue dilantin, lacosamide Neuro following WUA eeg repeat ordered Global: treating ileus, weaning, family meeting  BEST PRACTICE / DISPOSITION Level of Care:  ICU Primary Service:  PCCM Consultants: neurology Code Status:  NCB, no trach or peg option Diet:  Tube feeds DVT Px:  scd GI Px:  PPI Skin Integrity:  good Social / Family:  Ccm time 30 min   Mcarthur Rossetti. Tyson Alias, MD, FACP Pgr: [phone] [facility]

## 2012-06-20 NOTE — Progress Notes (Signed)
NEURO HOSPITALIST PROGRESS NOTE   SUBJECTIVE:                                                                                                                        Patient is non responsive and just has received 2 mg versed due to left arm high velocity tremor.  The tremor is mostly noted when patient is touched or manipulated but can also occur when he is not manipulated.  Eyes are straight forward without deviation,  No response to external stimuli. No TC movements.   OBJECTIVE:                                                                                                                           Vital signs in last 24 hours: Temp:  [100 F (37.8 C)-101.8 F (38.8 C)] 100 F (37.8 C) (03/06 0411) Pulse Rate:  [106-123] 111 (03/06 0411) Resp:  [13-27] 24 (03/06 0411) BP: (132-179)/(70-91) 150/78 mmHg (03/06 0411) SpO2:  [97 %-100 %] 100 % (03/06 0721) FiO2 (%):  [40 %] 40 % (03/06 0721)  Intake/Output from previous day: 03/05 0701 - 03/06 0700 In: 2595.5 [I.V.:1455.5; NG/GT:890; IV Piggyback:250] Out: 1495 [Urine:1495] Intake/Output this shift:   Nutritional status: NPO  Past Medical History  Diagnosis Date  . Hypertension   . Diabetes mellitus   . CVA (cerebral vascular accident)   . Stroke      Neurologic Exam:   Mental Status: Intubated breathing over vent, left> right arm tremor-- tremor is mostly noted when patient is touched or manipulated but can also occur when he is not manipulated.  Eyes are straight forward without deviation,  No response to external stimuli. No TC movements.   Cranial Nerves: II: no blink to threat, pupils equal, round, reactive to light and accommodation III,IV, VI: ptosis not present, no doll's noted V,VII: smile symmetric, VIII: unable to assess IX,X: gag reflex present XI: bilateral shoulder shrug XII: unable to assess Motor: No spontaneous or purposeful movement.  As noted above he has a  high velocity tremor left arm greater than right and occurs largely when patient is manipulated then fatigues and stops.  No increased tone and extremities flaccid throughout.  Tone and bulk:normal tone throughout; no  atrophy noted Sensory: no response to pain throughout Deep Tendon Reflexes: depressed throughout except at the ankles which are brisk at 3+ Plantars: Right: downgoing   Left: downgoing CV: pulses palpable throughout   Lab Results: Lab Results  Component Value Date/Time   CHOL  Value: 212        ATP III CLASSIFICATION:  <200     mg/dL   Desirable  161-096  mg/dL   Borderline High  >=045    mg/dL   High       * 07/24/8117  6:45 AM   Lipid Panel  Recent Labs  06/19/12 0443  TRIG 342*    Studies/Results: Dg Chest Port 1 View  06/19/2012  *RADIOLOGY REPORT*  Clinical Data: Assess ETT tube  PORTABLE CHEST - 1 VIEW  Comparison: 06/18/2012  Findings: Increased interstitial markings, possibly reflecting mild interstitial edema.  Mild patchy left lower lobe opacity, atelectasis versus pneumonia.  Small left pleural effusion. No pneumothorax.  Endotracheal tube terminates 1.5 cm above the carina.  Stable enteric tube coursing below the diaphragm.  Stable left arm PICC.  Mild cardiomegaly  IMPRESSION: Endotracheal tube terminates 1.5 cm above the carina.  Cardiomegaly with suspected mild interstitial edema.  Small left pleural effusion.  Mild left lower lobe opacity, atelectasis versus pneumonia.   Original Report Authenticated By: Charline Bills, M.D.    Dg Abd Portable 1v  06/18/2012  *RADIOLOGY REPORT*  Clinical Data: Abdominal distention.  PORTABLE ABDOMEN - 1 VIEW  Comparison: 06/15/2012.  Findings: The NG tube is in the fundal region of the stomach and the proximal port is in the distal esophagus.  This could be advanced several centimeters.  Moderate air in the colon and scattered air in the small bowel suggests a mild ileus.  No free air or findings for small bowel obstruction.   The soft tissue shadows are maintained.  The bony structures are intact.  IMPRESSION:  1.  The NG tube tip is in the fundal region of the stomach just below the GE junction.  The proximal port is in the distal esophagus.  This could be advanced several centimeters. 2.  Mild diffuse ileus bowel gas pattern.   Original Report Authenticated By: Rudie Meyer, M.D.     MEDICATIONS                                                                                                                        Scheduled: . albuterol  8 puff Inhalation Q4H  . ampicillin-sulbactam (UNASYN) IV  3 g Intravenous Q8H  . antiseptic oral rinse  15 mL Mouth Rinse QID  . chlorhexidine  15 mL Mouth Rinse BID  . cloNIDine  0.2 mg Oral TID  . feeding supplement  30 mL Per Tube TID  . furosemide  40 mg Intravenous Q12H  . insulin aspart  0-9 Units Subcutaneous Q4H  . ipratropium  6 puff Inhalation Q4H  . labetalol  100 mg Oral BID  . lacosamide (VIMPAT) IV  200 mg Intravenous Q12H  . levETIRAcetam  500 mg Intravenous Q12H  . metoCLOPramide (REGLAN) injection  5 mg Intravenous Q6H  . pantoprazole (PROTONIX) IV  40 mg Intravenous Q12H  . phenytoin (DILANTIN) IV  100 mg Intravenous Q8H  . sodium chloride  10-40 mL Intracatheter Q12H    ASSESSMENT/PLAN:                                                                                                             Encephalopathic process most likely multifactorial in etiology, including metabolic and infectious abnormalities, as well as sedating medications. Per nurse patient has been having tremulous activity over last 12 hours as noted in above exam. 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  No further recommendations at this time.   Assessment and plan discussed with with attending physician and they are in agreement.    Felicie Morn PA-C [facility] [phone]  06/20/2012, 9:08 AM

## 2012-06-20 NOTE — Progress Notes (Signed)
Portable routine EEG completed.

## 2012-06-20 NOTE — Progress Notes (Signed)
Pt was weaning on C/PS 10/5, pt was having increased work of breathing, increased RR, and increased HR.  Pt placed back on full support. RT will continue to monitor.

## 2012-06-21 LAB — GLUCOSE, CAPILLARY
Glucose-Capillary: 210 mg/dL — ABNORMAL HIGH (ref 70–99)
Glucose-Capillary: 275 mg/dL — ABNORMAL HIGH (ref 70–99)

## 2012-06-21 MED ORDER — LABETALOL HCL 200 MG PO TABS
200.0000 mg | ORAL_TABLET | Freq: Two times a day (BID) | ORAL | Status: DC
  Administered 2012-06-21 (×2): 200 mg via ORAL
  Filled 2012-06-21 (×4): qty 1

## 2012-06-21 MED ORDER — FUROSEMIDE 10 MG/ML IJ SOLN
80.0000 mg | Freq: Three times a day (TID) | INTRAMUSCULAR | Status: DC
  Administered 2012-06-21 – 2012-06-22 (×3): 80 mg via INTRAVENOUS
  Filled 2012-06-21 (×4): qty 8

## 2012-06-21 NOTE — Progress Notes (Signed)
NEURO HOSPITALIST PROGRESS NOTE   SUBJECTIVE:                                                                                                                        No seizures noted.  Patient remains intubated and on fentanyl.   OBJECTIVE:                                                                                                                           Vital signs in last 24 hours: Temp:  [100.1 F (37.8 C)-101.3 F (38.5 C)] 100.3 F (37.9 C) (03/07 1000) Pulse Rate:  [107-129] 112 (03/07 1000) Resp:  [17-28] 19 (03/07 1000) BP: (123-197)/(70-103) 157/81 mmHg (03/07 1000) SpO2:  [98 %-100 %] 100 % (03/07 1000) FiO2 (%):  [40 %] 40 % (03/07 1109) Weight:  [96.1 kg (211 lb 13.8 oz)] 96.1 kg (211 lb 13.8 oz) (03/07 0433)  Intake/Output from previous day: 03/06 0701 - 03/07 0700 In: 2972.5 [I.V.:822.5; NG/GT:1540; IV Piggyback:600] Out: 2335 [Urine:2335] Intake/Output this shift: Total I/O In: 310 [I.V.:115; NG/GT:195] Out: 410 [Urine:410] Nutritional status: NPO  Past Medical History  Diagnosis Date  . Hypertension   . Diabetes mellitus   . CVA (cerebral vascular accident)   . Stroke     Neurologic Exam:  Mental Status:  Intubated breathing over vent. No tremor noted today. Eyes are straight forward without deviation, No response to external stimuli.  Cranial Nerves:  II: no blink to threat, pupils equal, round, reactive to light and accommodation  III,IV, VI: ptosis not present, no doll's noted  V,VII: smile symmetric,  VIII: unable to assess  IX,X: gag reflex present  XI: bilateral shoulder shrug  XII: unable to assess  Motor:  No spontaneous or purposeful movement. No increased tone and extremities flaccid throughout.  Tone and bulk:normal tone throughout; no atrophy noted  Sensory: no response to pain throughout  Deep Tendon Reflexes: depressed throughout except at the ankles which are brisk at 3+  Plantars:   Right: downgoing  Left: downgoing  CV: pulses palpable throughout    Lab Results: Lab Results  Component Value Date/Time   CHOL  Value: 212        ATP III CLASSIFICATION:  <200  mg/dL   Desirable  161-096  mg/dL   Borderline High  >=045    mg/dL   High       * 07/24/8117  6:45 AM   Lipid Panel  Recent Labs  06/19/12 0443  TRIG 342*    Studies/Results: No results found.  MEDICATIONS                                                                                                                        Scheduled: . albuterol  8 puff Inhalation Q4H  . ampicillin-sulbactam (UNASYN) IV  3 g Intravenous Q8H  . antiseptic oral rinse  15 mL Mouth Rinse QID  . chlorhexidine  15 mL Mouth Rinse BID  . cloNIDine  0.2 mg Oral TID  . feeding supplement  30 mL Per Tube TID  . furosemide  80 mg Intravenous Q8H  . insulin aspart  0-9 Units Subcutaneous Q4H  . ipratropium  6 puff Inhalation Q4H  . labetalol  200 mg Oral BID  . lacosamide (VIMPAT) IV  200 mg Intravenous Q12H  . levETIRAcetam  500 mg Intravenous Q12H  . pantoprazole (PROTONIX) IV  40 mg Intravenous Q12H  . phenytoin (DILANTIN) IV  100 mg Intravenous Q8H  . sodium chloride  10-40 mL Intracatheter Q12H    ASSESSMENT/PLAN:                                                                                                             Encephalopathic process most likely multifactorial in etiology, including metabolic and infectious abnormalities, as well as sedating medications. Pending EEG. Continue AED Keppra, Dilantin and Vimpat.  No further recommendations at this time.   Per nurse, family meeting tomorrow pertaining to withdrawal.    Assessment and plan discussed with with attending physician and they are in agreement.    Felicie Morn PA-C [facility] [phone]  06/21/2012, 11:27 AM

## 2012-06-21 NOTE — Progress Notes (Signed)
Name: Robert Mcclure MRN: [id number] DOB: 10-15-1945    LOS: 19  Referring Provider:  [name] NP Reason for Referral:  Hypoxic respiratory failure  PULMONARY / CRITICAL CARE MEDICINE    Brief patient description:  67 y/o with CVA, seizure d/o and HTN, intubated for respiratory failure following witnessed aspiration event. He was admitted on 05/29/2012 to the ICU on the PCCM service in status epilepticus (required intubation for airway protection). He was extubated on 2/19 and transferred to the hospitalist service on 2/21. He has had trouble with blood pressure management since transfer. He has also required an NG tube for medications and feeding. Early in the morning on 3/2 (around 1:30 am). He vomited what appeared to be coffee grounds. He then developed respiratory distress   Lines: 3/2 ETT>>> 2/28 left  PICC>>>   ABX: vanc 3/2>>>3/4 Zosyn 3/2>>>3/4 Linazolid 3/4>>>3/5 Amp 3/4>>>  Micro: Sputum 3/2>>>amp sens enteroccus Sputum 3/2>>>same Urine 3/2>>> BC 3/2 x 4>>>  Events/ studies: 3/2 - vomited, reintubated 3/4- eeg neg focus, enceph 3/5- family meeting- NCB, no trach /peg option 3/6 eeg>>>  Events Since Admission: No improved neurostatus  Vital Signs: Temp:  [100.1 F (37.8 C)-101.3 F (38.5 C)] 100.3 F (37.9 C) (03/07 0800) Pulse Rate:  [100-129] 118 (03/07 0800) Resp:  [17-28] 19 (03/07 0800) BP: (116-197)/(67-103) 181/94 mmHg (03/07 0800) SpO2:  [98 %-100 %] 100 % (03/07 0800) FiO2 (%):  [40 %] 40 % (03/07 0800) Weight:  [96.1 kg (211 lb 13.8 oz)] 96.1 kg (211 lb 13.8 oz) (03/07 0433)  Physical Examination: General:  No purposeful movement Neuro:  Sedated rass -2, not following commands HEENT: jvd wnl Neck:  Supple, no masses, jvd wnl Cardiovascular:  s1 s2 RRR, no mrg Lungs: ronchi unchanged Abdomen:  Soft, NTND, +BS Musculoskeletal:  No joint abnormalities Skin:  No rashes   Principal Problem:   Acute respiratory failure Active  Problems:   HTN (hypertension), malignant   CVA (cerebral vascular accident)   Diabetes mellitus   Aspiration pneumonia   Encephalopathy, metabolic   Dysphagia, unspecified   Altered mental status   Weakness generalized   ASSESSMENT AND PLAN  PULMONARY  Recent Labs Lab 06/16/12 0135 06/16/12 0229  PHART 7.405 7.360  PCO2ART 36.8 40.4  PO2ART 60.4* 218.0*  HCO3 22.3 21.7  O2SAT 90.8 99.4   Ventilator Settings: Vent Mode:  [-] PRVC FiO2 (%):  [40 %] 40 % Set Rate:  [20 bmp] 20 bmp Vt Set:  [620 mL] 620 mL PEEP:  [5 cmH20] 5 cmH20 Plateau Pressure:  [20 cmH20-27 cmH20] 20 cmH20 No results found. PULM pcxr- bilat infiltrates  Improved hilum  A:  Aspiration pneumonitis Acute respiratory failure NO TRACH wished under any circumstance P:   Wean as goal PS 10, cpsp 5 -consider comfort care in am  -consider lasix increase to neg balance, still pos 600 cc  CARDIOVASCULAR  Recent Labs Lab 06/16/12 0900  PROBNP 3241.0*   ECG:  Sinus tachycardia Lines: left picc line  A: HTN, r/o seziures P:  Tele BP wnl labetolol increase to 200 q12h Clonidine tube improved BP control Control neuro events?  RENAL  Recent Labs Lab 06/16/12 2120 06/17/12 0500 06/17/12 0930 06/18/12 0425 06/19/12 0443 06/20/12 0410  NA 141  --  141 139 144 141  K 4.5  --  3.7 3.9 4.0 3.8  CL 105  --  104 103 108 107  CO2 21  --  18* 21 22 21   BUN 38*  --  47* 53* 47* 57*  CREATININE 2.89*  --  3.09* 2.68* 1.97* 1.91*  CALCIUM 7.7*  --  7.9* 7.9* 8.0* 7.9*  MG 2.0 2.0  --   --   --   --   PHOS  --  4.8*  --   --   --   --    Intake/Output     03/06 0701 - 03/07 0700 03/07 0701 - 03/08 0700   I.V. (mL/kg) 822.5 (8.6)    Other 10    NG/GT 1540    IV Piggyback 600    Total Intake(mL/kg) 2972.5 (30.9)    Urine (mL/kg/hr) 2335 (1)    Total Output 2335     Net +637.5           Foley:  In place  A:  CKD, ARF, pos balance again P:   kvo Lasix increase Chem awaited Chem in  am  May need additional metalazone  GASTROINTESTINAL  Recent Labs Lab 06/15/12 0500 06/16/12 2120 06/17/12 0930 06/18/12 0823  AST 30 26 25   --   ALT 33 25 23  --   ALKPHOS 87 65 57  --   BILITOT 0.5 0.5 0.4  --   PROT 6.0 5.9* 5.8*  --   ALBUMIN 2.6* 2.1* 2.0* 2.0*    A:  Failed modified barium swallow, dysphagia, ileus improved P:   May need repeat reglan TF to goal, residuals slight increased but still low PEG not wished Follow last BM  HEMATOLOGIC  Recent Labs Lab 06/16/12 2120 06/17/12 0500 06/18/12 0425 06/18/12 1150 06/20/12 0410  HGB 9.0* 8.4* 7.6* 7.7* 7.5*  HCT 27.4* 26.1* 22.7* 22.9* 23.0*  PLT 208 205 222 238 237  INR  --   --   --  1.32  --   APTT  --   --   --  41*  --    A:  Anemia, dvt prevention, dilutional anemia likely P:  scd Cbc awaited  INFECTIOUS  Recent Labs Lab 06/16/12 2120 06/17/12 0500 06/18/12 0425 06/18/12 1150 06/20/12 0410  WBC 9.6 10.9* 10.0 11.7* 9.8   A:  enteroccal asp pNA P:   Continue amp till 9th  ENDOCRINE  Recent Labs Lab 06/20/12 1720 06/20/12 1936 06/20/12 2358 06/21/12 0418 06/21/12 0728  GLUCAP 217* 177* 134* 213* 176*   A:  DM , uncontraolled at times P:   q4h accuchecks and ssi Likely to NPo soon for weaning / extubation, hold of increase insulin  NEUROLOGIC  A:  S/p CVA, seizure d/o, dysphagia, eeg neg 3/4, prop off secondary to trig, LIkely poor fxnal recovery P:   Continue dilantin, lacosamide Neuro following WUA eeg repeat - awaited  Global: ileus better, poor neuro status, will consider comfort in am , will call family  BEST PRACTICE / DISPOSITION Level of Care:  ICU Primary Service:  PCCM Consultants: neurology Code Status:  NCB, no trach or peg option Diet:  Tube feeds DVT Px:  scd GI Px:  PPI Skin Integrity:  good Social / Family: will discuss consideration comfort in am   Ccm time 30 min   Mcarthur Rossetti. Tyson Alias, MD, FACP Pgr: [phone] [facility]

## 2012-06-21 NOTE — Progress Notes (Signed)
Inpatient Diabetes Program Recommendations  AACE/ADA: New Consensus Statement on Inpatient Glycemic Control (2013)  Target Ranges:  Prepandial:   less than 140 mg/dL      Peak postprandial:   less than 180 mg/dL (1-2 hours)      Critically ill patients:  140 - 180 mg/dL    Results for ILARIO, DHALIWAL (MRN [id number]) as of 06/21/2012 15:06  Ref. Range 06/19/2012 23:48 06/20/2012 04:46 06/20/2012 07:29 06/20/2012 12:32 06/20/2012 17:20 06/20/2012 19:36  Glucose-Capillary Latest Range: 70-99 mg/dL 130 (H) 865 (H) 784 (H) 208 (H) 217 (H) 177 (H)   Results for RAVINDER, LUKEHART (MRN [id number]) as of 06/21/2012 15:06  Ref. Range 06/20/2012 23:58 06/21/2012 04:18 06/21/2012 07:28 06/21/2012 12:15  Glucose-Capillary Latest Range: 70-99 mg/dL 132 (H) 440 (H) 102 (H) 210 (H)   Patient takes Lantus 30 units at home per home med rec.  Patient having glucose elevations.  Inpatient Diabetes Program Recommendations Insulin - Basal: Please consider adding 1/3 patient's home dose of Lantus- Lantus 10 units QHS.  Note: Will follow. Ambrose Finland RN, MSN, CDE Diabetes Coordinator Inpatient Diabetes Program [phone]

## 2012-06-22 LAB — CULTURE, BLOOD (ROUTINE X 2): Culture: NO GROWTH

## 2012-06-22 LAB — GLUCOSE, CAPILLARY: Glucose-Capillary: 232 mg/dL — ABNORMAL HIGH (ref 70–99)

## 2012-06-22 LAB — BASIC METABOLIC PANEL
GFR calc non Af Amer: 39 mL/min — ABNORMAL LOW (ref >90)
Glucose, Bld: 201 mg/dL — ABNORMAL HIGH (ref 70–99)
Potassium: 4.1 mEq/L (ref 3.5–5.1)
Sodium: 153 mEq/L — ABNORMAL HIGH (ref 135–145)

## 2012-06-22 MED ORDER — IPRATROPIUM BROMIDE 0.02 % IN SOLN
RESPIRATORY_TRACT | Status: AC
  Filled 2012-06-22: qty 2.5

## 2012-06-22 MED ORDER — DEXTROSE 5 % IV SOLN
INTRAVENOUS | Status: DC
  Administered 2012-06-22: 50 mL via INTRAVENOUS
  Administered 2012-06-23: 10:00:00 via INTRAVENOUS

## 2012-06-22 MED ORDER — IPRATROPIUM BROMIDE 0.02 % IN SOLN
0.5000 mg | Freq: Four times a day (QID) | RESPIRATORY_TRACT | Status: DC
  Administered 2012-06-22 – 2012-06-23 (×3): 0.5 mg via RESPIRATORY_TRACT
  Filled 2012-06-22 (×2): qty 2.5

## 2012-06-22 MED ORDER — LORAZEPAM 2 MG/ML IJ SOLN
2.0000 mg | INTRAMUSCULAR | Status: DC | PRN
  Administered 2012-06-22 – 2012-06-23 (×3): 2 mg via INTRAVENOUS
  Filled 2012-06-22 (×2): qty 1

## 2012-06-22 MED ORDER — LABETALOL HCL 300 MG PO TABS
300.0000 mg | ORAL_TABLET | Freq: Two times a day (BID) | ORAL | Status: DC
  Administered 2012-06-22 (×2): 300 mg via ORAL
  Filled 2012-06-22 (×4): qty 1

## 2012-06-22 MED ORDER — ALBUTEROL SULFATE (5 MG/ML) 0.5% IN NEBU
INHALATION_SOLUTION | RESPIRATORY_TRACT | Status: AC
  Filled 2012-06-22: qty 0.5

## 2012-06-22 MED ORDER — ALBUTEROL SULFATE (5 MG/ML) 0.5% IN NEBU
2.5000 mg | INHALATION_SOLUTION | RESPIRATORY_TRACT | Status: DC
  Administered 2012-06-22 – 2012-06-23 (×5): 2.5 mg via RESPIRATORY_TRACT
  Filled 2012-06-22 (×4): qty 0.5

## 2012-06-22 NOTE — Procedures (Signed)
Extubation Procedure Note  Patient Details:   Name: Robert Mcclure DOB: [birth year] MRN: [id number]   Airway Documentation:     Evaluation  O2 sats: currently acceptable Complications: Complications of desaturation, resolved with nasal cannula Patient did tolerate procedure well. Bilateral Breath Sounds: Clear;Diminished (clear with suctioning ) Suctioning: Airway No  Ok Anis, MA 06/22/2012, 10:14 AM

## 2012-06-22 NOTE — Progress Notes (Signed)
Name: Robert Mcclure MRN: [id number] DOB: [birth year]    LOS: 20  Referring Provider:  [name] NP Reason for Referral:  Hypoxic respiratory failure  PULMONARY / CRITICAL CARE MEDICINE    Brief patient description:  67 y/o with CVA, seizure d/o and HTN, intubated for respiratory failure following witnessed aspiration event. He was admitted on 05/25/2012 to the ICU on the PCCM service in status epilepticus (required intubation for airway protection). He was extubated on 2/19 and transferred to the hospitalist service on 2/21. He has had trouble with blood pressure management since transfer. He has also required an NG tube for medications and feeding. Early in the morning on 3/2 (around 1:30 am). He vomited what appeared to be coffee grounds. He then developed respiratory distress   Lines: 3/2 ETT>>> 2/28 left  PICC>>>   ABX: vanc 3/2>>>3/4 Zosyn 3/2>>>3/4 Linazolid 3/4>>>3/5 Amp 3/4>>>  Micro: Sputum 3/2>>>amp sens enteroccus Sputum 3/2>>>same Urine 3/2>>> BC 3/2 x 4>>>  Events/ studies: 3/2 - vomited, reintubated 3/4- eeg neg focus, enceph 3/5- family meeting- NCB, no trach /peg option 3/6 eeg>>>still pending  Events Since Admission: No seizures noted, opens eyes  Vital Signs: Temp:  [99.6 F (37.6 C)-101.9 F (38.8 C)] 99.8 F (37.7 C) (03/08 0800) Pulse Rate:  [99-118] 115 (03/08 0800) Resp:  [19-23] 20 (03/08 0800) BP: (125-168)/(60-112) 157/69 mmHg (03/08 0800) SpO2:  [93 %-100 %] 100 % (03/08 0800) FiO2 (%):  [40 %] 40 % (03/08 0800) Weight:  [95.3 kg (210 lb 1.6 oz)] 95.3 kg (210 lb 1.6 oz) (03/08 0442)  Physical Examination: General:  No purposeful movement Neuro:  Sedated rass -2, not following commands, calmer HEENT: jvd wnl Neck:  Supple, no masses, jvd wnl Cardiovascular:  s1 s2 RRR, no mrg Lungs: less coarse Abdomen:  Soft, NTND, +BS Musculoskeletal:  No joint abnormalities Skin:  No rashes   Principal Problem:   Acute respiratory  failure Active Problems:   HTN (hypertension), malignant   CVA (cerebral vascular accident)   Diabetes mellitus   Aspiration pneumonia   Encephalopathy, metabolic   Dysphagia, unspecified   Altered mental status   Weakness generalized   ASSESSMENT AND PLAN  PULMONARY  Recent Labs Lab 06/16/12 0135 06/16/12 0229  PHART 7.405 7.360  PCO2ART 36.8 40.4  PO2ART 60.4* 218.0*  HCO3 22.3 21.7  O2SAT 90.8 99.4   Ventilator Settings: Vent Mode:  [-] PRVC FiO2 (%):  [40 %] 40 % Set Rate:  [20 bmp] 20 bmp Vt Set:  [620 mL] 620 mL PEEP:  [5 cmH20] 5 cmH20 Plateau Pressure:  [18 cmH20-21 cmH20] 18 cmH20 No results found. PULM pcxr- bilat infiltrates  Improved hilum  A:  Aspiration pneumonitis Acute respiratory failure NO TRACH wished under any circumstance P:   Wean as goal PS 10, cpsp 5, to goal 5, then assess needs morphine if extubated secretions are minimal Goal to ps 5 Appears that today is best window for extubation success, family to arrive  CARDIOVASCULAR  Recent Labs Lab 06/16/12 0900  PROBNP 3241.0*   ECG:  Sinus tachycardia Lines: left picc line  A: HTN, r/o seziures P:  Tele BP wnl labetolol increase to 200 q12h, increase Clonidine tube improved BP control consider benzo addition if tachy, HTN episodes are seziure  RENAL  Recent Labs Lab 06/16/12 2120 06/17/12 0500 06/17/12 0930 06/18/12 0425 06/19/12 0443 06/20/12 0410 06/22/12 0435  NA 141  --  141 139 144 141 153*  K 4.5  --  3.7 3.9 4.0  3.8 4.1  CL 105  --  104 103 108 107 117*  CO2 21  --  18* 21 22 21 26   BUN 38*  --  47* 53* 47* 57* 66*  CREATININE 2.89*  --  3.09* 2.68* 1.97* 1.91* 1.75*  CALCIUM 7.7*  --  7.9* 7.9* 8.0* 7.9* 8.4  MG 2.0 2.0  --   --   --   --   --   PHOS  --  4.8*  --   --   --   --   --    Intake/Output     03/07 0701 - 03/08 0700 03/08 0701 - 03/09 0700   I.V. (mL/kg) 875 (9.2) 30 (0.3)   Other     NG/GT 780    IV Piggyback 575    Total Intake(mL/kg)  2230 (23.4) 30 (0.3)   Urine (mL/kg/hr) 1780 (0.8) 450 (3.9)   Total Output 1780 450   Net +450 -420         Foley:  In place  A:  CKD, ARF, pos balance again, hypernatremia after lasix day P:   Hold lasix Add d5w Chem inam   GASTROINTESTINAL  Recent Labs Lab 06/16/12 2120 06/17/12 0930 06/18/12 0823  AST 26 25  --   ALT 25 23  --   ALKPHOS 65 57  --   BILITOT 0.5 0.4  --   PROT 5.9* 5.8*  --   ALBUMIN 2.1* 2.0* 2.0*    A:  Failed modified barium swallow, dysphagia, ileus improved P:   Hold T for extubation Keep NGT in   HEMATOLOGIC  Recent Labs Lab 06/16/12 2120 06/17/12 0500 06/18/12 0425 06/18/12 1150 06/20/12 0410  HGB 9.0* 8.4* 7.6* 7.7* 7.5*  HCT 27.4* 26.1* 22.7* 22.9* 23.0*  PLT 208 205 222 238 237  INR  --   --   --  1.32  --   APTT  --   --   --  41*  --    A:  Anemia, dvt prevention, dilutional anemia likely P:  scd Tx will not change outcome  INFECTIOUS  Recent Labs Lab 06/16/12 2120 06/17/12 0500 06/18/12 0425 06/18/12 1150 06/20/12 0410  WBC 9.6 10.9* 10.0 11.7* 9.8   A:  enteroccal asp pNA P:   Continue amp till 9th  ENDOCRINE  Recent Labs Lab 06/21/12 1547 06/21/12 2010 06/22/12 0001 06/22/12 0404 06/22/12 0741  GLUCAP 275* 221* 232* 172* 151*   A:  DM , uncontraolled at times P:    ssi   NEUROLOGIC  A:  S/p CVA, seizure d/o, dysphagia, eeg neg 3/4, prop off secondary to trig, LIkely poor fxnal recovery P:   Continue dilantin, lacosamide Neuro following WUA eeg repeat - awaited, will ask for report  Global:Weaning well, neuro status poor remains. Likely for extubation and comfort if fils  BEST PRACTICE / DISPOSITION Level of Care:  ICU Primary Service:  PCCM Consultants: neurology Code Status:  NCB, no trach or peg option Diet:  Tube feeds DVT Px:  scd GI Px:  PPI Skin Integrity:  good Social / Family: Family to arrive, will update and confirm plans dc vent  Ccm time 30 min   Mcarthur Rossetti.  Tyson Alias, MD, FACP Pgr: [phone] [facility]

## 2012-06-23 DIAGNOSIS — R0600 Dyspnea, unspecified: Secondary | ICD-10-CM

## 2012-06-23 DIAGNOSIS — R0989 Other specified symptoms and signs involving the circulatory and respiratory systems: Secondary | ICD-10-CM

## 2012-06-23 LAB — GLUCOSE, CAPILLARY: Glucose-Capillary: 207 mg/dL — ABNORMAL HIGH (ref 70–99)

## 2012-06-23 LAB — BASIC METABOLIC PANEL
CO2: 25 mEq/L (ref 19–32)
Calcium: 8.3 mg/dL — ABNORMAL LOW (ref 8.4–10.5)
Chloride: 116 mEq/L — ABNORMAL HIGH (ref 96–112)
Glucose, Bld: 160 mg/dL — ABNORMAL HIGH (ref 70–99)
Potassium: 3.1 mEq/L — ABNORMAL LOW (ref 3.5–5.1)
Sodium: 155 mEq/L — ABNORMAL HIGH (ref 135–145)

## 2012-06-23 MED ORDER — LORAZEPAM BOLUS VIA INFUSION
2.0000 mg | INTRAVENOUS | Status: DC | PRN
  Filled 2012-06-23: qty 5

## 2012-06-23 MED ORDER — BIOTENE DRY MOUTH MT LIQD
15.0000 mL | Freq: Two times a day (BID) | OROMUCOSAL | Status: DC
  Administered 2012-06-23: 15 mL via OROMUCOSAL

## 2012-06-23 MED ORDER — MORPHINE BOLUS VIA INFUSION
5.0000 mg | INTRAVENOUS | Status: DC | PRN
  Filled 2012-06-23: qty 20

## 2012-06-23 MED ORDER — CHLORHEXIDINE GLUCONATE 0.12 % MT SOLN
15.0000 mL | Freq: Two times a day (BID) | OROMUCOSAL | Status: DC
  Administered 2012-06-23: 15 mL via OROMUCOSAL
  Filled 2012-06-23 (×3): qty 15

## 2012-06-23 MED ORDER — FENTANYL CITRATE 0.05 MG/ML IJ SOLN
25.0000 ug | INTRAMUSCULAR | Status: DC | PRN
  Administered 2012-06-23: 25 ug via INTRAVENOUS

## 2012-06-23 MED ORDER — DEXTROSE 5 % IV SOLN
10.0000 mg/h | INTRAVENOUS | Status: DC
  Administered 2012-06-23: 5 mg/h via INTRAVENOUS
  Filled 2012-06-23: qty 10

## 2012-06-23 MED ORDER — LORAZEPAM 2 MG/ML IJ SOLN
5.0000 mg/h | INTRAVENOUS | Status: DC
  Administered 2012-06-23: 2 mg/h via INTRAVENOUS
  Filled 2012-06-23: qty 25

## 2012-06-23 MED ORDER — FENTANYL CITRATE 0.05 MG/ML IJ SOLN
INTRAMUSCULAR | Status: AC
  Filled 2012-06-23: qty 2

## 2012-06-24 NOTE — Care Management Note (Signed)
    Page 1 of 2   06/24/2012     8:17:01 AM   CARE MANAGEMENT NOTE 06/24/2012  Patient:  Robert Mcclure, Robert Mcclure   Account Number:  [id number]  Date Initiated:  06/06/2012  Documentation initiated by:  Carlyle Lipa  Subjective/Objective Assessment:   resp failure, seizures     Action/Plan:   await PT/OT evals to determine d/c needs; just extubated within last 48hr   Anticipated DC Date:  06/18/2012   Anticipated DC Plan:  SKILLED NURSING FACILITY  In-house referral  Clinical Social Worker      DC Planning Services  CM consult      Choice offered to / List presented to:             Status of service:  Completed, signed off Medicare Important Message given?   (If response is "NO", the following Medicare IM given date fields will be blank) Date Medicare IM given:   Date Additional Medicare IM given:    Discharge Disposition:  EXPIRED  Per UR Regulation:  Reviewed for med. necessity/level of care/duration of stay  If discussed at Long Length of Stay Meetings, dates discussed:   06/11/2012  06/13/2012  06/18/2012    Comments:  06-20-12 10am Avie Arenas, RNBSN - [phone] Remains on vent.  Not responding.  Family conference, made DNR.  No escalation of care but plan to continue to treat. If no improvement - plan for comfort.  06-17-12 9:40am Avie Arenas, [phone] Vomited - coffee grounds - SOB requiring intubation - tx back to unit - plan for dc to SNF.  06/14/12 1100- Donn Pierini RN, BSN [phone] Pt mental status back to somulent this am after being alert and interactive 2/27- per conversation with MD- will restart TF per NGT and watch over weekend- if pt mental status improved enough by 3/3 will try to repeat swallow eval and if able restart po's- if pt unable to pass swallow then will proceed with PEG placement if family still desires full tx.  CSW continues to follow for SNF placement.  06/13/12- 0830- Donn Pierini RN, BSN [phone] Pt now on SDU with decline  in status- NGT still to sx, NPO- PC consulted on 2/25- will met with family again today- pt for SNF vs deciding on comfort care- if family still wants full tx then will need to decide long term nutritional support.  06-10-12 1516 Tomi Bamberger, RN,BSN Plan for repeat MRI and family to decide if will need long term nutritional support with PEG tube, Plan for SNF at d/c.

## 2012-06-30 NOTE — Discharge Summary (Signed)
NAME:  Robert Mcclure, Robert Mcclure.:  [id number]  MEDICAL RECORD NO.:  [id number]  LOCATION:  [id number]                        FACILITY:  [facility]  PHYSICIAN:  Nelda Bucks, MD DATE OF BIRTH:  17-Nov-1945  DATE OF ADMISSION:  06/01/2012 DATE OF DISCHARGE:  06/27/2012                              DISCHARGE SUMMARY   DEATH SUMMARY  This is a 67 year old, African American male with history of stroke, seizure disorder, hypertension, intubated for respiratory failure requiring after witnessed aspiration event.  Essentially, the patient had a prior stroke, came in with status epilepticus, was being managed on ventilator machine x2 secondary to recurrent aspiration events with aspiration pneumonia, and the second event secondary to continued status epilepticus and significant difficult-to-control seizures.  Neurology was consulted in managing this patient's seizures.  The patient was treated with antibiotics, did grow sensitive enterococcus in the sputum, which was treated appropriately.  He had an EEG, which was negative for focused, but positive for encephalopathy on June 18, 2012 with family meetings.  On June 19, 2012, for which medical power of attorney sister and daughter and son decided for no code blue status and no trach and no PEG option secondary to the patient's prior wishes on circumstances of poor declining status and status epilepticus is likely poor functional quality.  EEG was performed again on June 20, 2012 as well.  The patient failed to make any significant progress.  We did give the patient longer period of time on the ventilator machine to see if he could make improvements with seizure control and given chance for extubation success.  Unfortunately, the patient continued to make 0 progress.  The patient's family opted for comfort care and the patient expired.  FINAL DIAGNOSES UPON DEATH: 1. Status epilepticus prior stroke focus. 2. Prior cerebrovascular  accident. 3. Acute respiratory failure secondary to aspiration event. 4. Aspiration. 5. Metabolic encephalopathy. 6. Dysphagia.     Nelda Bucks, MD     DJF/MEDQ  D:  06/30/2012  T:  06/30/2012  Job:  [id number]

## 2012-07-16 NOTE — Progress Notes (Signed)
Progress Note from the Palliative Medicine Team at [facility]  Subjective: patient is resting comfortable, NAD,   -placed call to sister [name] to update on her brother's condition,  She is aware of full comfort care approach and reports family is comfortable with this, patient has been moved out of ICU     Objective: No Known Allergies Scheduled Meds: . antiseptic oral rinse  15 mL Mouth Rinse q12n4p  . chlorhexidine  15 mL Mouth Rinse BID  . ipratropium  0.5 mg Nebulization QID   Continuous Infusions: . dextrose 50 mL/hr at 07/12/2012 1018  . LORazepam (ATIVAN) infusion 2 mg/hr (07/06/2012 0933)  . morphine 5 mg/hr (07/12/2012 0933)   PRN Meds:.LORazepam, morphine, sodium chloride  BP 174/74  Pulse 110  Temp(Src) 99.7 F (37.6 C) (Core (Comment))  Resp 27  Ht 6' (1.829 m)  Wt 95.3 kg (210 lb 1.6 oz)  BMI 28.49 kg/m2  SpO2 99%   PPS: 10 %  Pain Score:unable to communicate needs    Intake/Output Summary (Last 24 hours) at 06/16/2012 1237 Last data filed at 07/08/2012 0933  Gross per 24 hour  Intake   1565 ml  Output   1005 ml  Net    560 ml       Physical Exam: General: ill appearing, appears comfortable on current gtts, unresponsive to gentle touch and verbal stimuli HEENT: Moist buccal membranes Chest: diminished in bases, scattered coarse BS CVS: tachycardic  Abdomen: soft +BS  Ext: trace edema BLE  Skin warm and dry  Neuro:  unresponsive   Labs: CBC    Component Value Date/Time   WBC 9.8 06/20/2012 0410   RBC 2.38* 06/20/2012 0410   HGB 7.5* 06/20/2012 0410   HCT 23.0* 06/20/2012 0410   PLT 237 06/20/2012 0410   MCV 96.6 06/20/2012 0410   MCH 31.5 06/20/2012 0410   MCHC 32.6 06/20/2012 0410   RDW 15.4 06/20/2012 0410   LYMPHSABS 1.4 06/20/2012 0410   MONOABS 1.0 06/20/2012 0410   EOSABS 0.1 06/20/2012 0410   BASOSABS 0.0 06/20/2012 0410    BMET    Component Value Date/Time   NA 155* 06/18/2012 0700   K 3.1* 06/21/2012 0700   CL 116* 07/08/2012 0700   CO2 25 06/20/2012  0700   GLUCOSE 160* 07/04/2012 0700   BUN 60* 07/08/2012 0700   CREATININE 1.59* 06/24/2012 0700   CALCIUM 8.3* 06/17/2012 0700   GFRNONAA 43* 06/18/2012 0700   GFRAA 50* 07/03/2012 0700    CMP     Component Value Date/Time   NA 155* 07/05/2012 0700   K 3.1* 06/15/2012 0700   CL 116* 07/04/2012 0700   CO2 25 06/19/2012 0700   GLUCOSE 160* 07/02/2012 0700   BUN 60* 07/01/2012 0700   CREATININE 1.59* 07/11/2012 0700   CALCIUM 8.3* 06/24/2012 0700   PROT 5.8* 06/17/2012 0930   ALBUMIN 2.0* 06/18/2012 0823   AST 25 06/17/2012 0930   ALT 23 06/17/2012 0930   ALKPHOS 57 06/17/2012 0930   BILITOT 0.4 06/17/2012 0930   GFRNONAA 43* 06/17/2012 0700   GFRAA 50* 06/29/2012 0700    1  Assessment and Plan: 1. Code Status: DNR/DNI-comfort is main focus of care 2. Symptom Control: Initiated by CCM and patient appears comfortable        Pain/Dyspnea: Morphine gtt  At present titrated to 7mg /hr        Agitation:Ativan gtt 2mg /hr 3. Psycho/Social: spoke with sister Tyler Aas, she reports that the family are all  comfortable with the comfort approach at this time 4. Spiritual Chaplain involved 5. Disposition: Dependant on outcomes, prognosis is hrs to days, expect hospital death  PMT will continue to support holistically   Lorinda Creed NP  Palliative Medicine Team Team Phone # [phone] Pager [phone]   1

## 2012-07-16 NOTE — Progress Notes (Signed)
Awaiting family arrival. Patient body in bed, awaiting family to transport patient to the morgue. Robert Mcclure

## 2012-07-16 NOTE — Progress Notes (Signed)
1515 Went into assess patient, patient found non responsive and not breathing.  Charge Nurse notified, came in to assess pt.  Pt was pronounced dead at 1520. Lorinda Creed, NP and Dr. Marin Shutter called and made aware.  1545 Call placed to [name], patients sister at [phone], left a message, awaiting call back.  200cc of Morphine 1mg /ml in Dextrose 5% wasted and 20cc of Ativan 1mg /ml in Dextrose 5% wasted in the sink with Vanna Scotland, Forensic scientist.  PICC Line and Foley D/C'd. A. Day, RN

## 2012-07-16 NOTE — Progress Notes (Signed)
Pt. Transported to morgue. Logged patient in with security at morgue to receive pt.  Robert Mcclure

## 2012-07-16 NOTE — Progress Notes (Signed)
eLink Physician-Brief Progress Note Patient Name: Robert Mcclure DOB: Jan 13, 1946 MRN: [id number]  Date of Service  Jul 19, 2012   HPI/Events of Note   Restless, increased WOB  eICU Interventions  Low dose fentanyl for relief of dyspnea      Teresina Bugaj July 19, 2012, 4:51 AM

## 2012-07-16 NOTE — Progress Notes (Signed)
1730 [name] here to see patient, states that she has other family members on their way to see patient.  Awaiting the arrival of the rest of the family to see patient. A. Kaamil Morefield, RN

## 2012-07-16 NOTE — Procedures (Signed)
EEG NUMBER:  [id number].  REFERRING PHYSICIAN:  Konstantin Zubelevitskiy.  INDICATION FOR STUDY:  A 67 year old man, intubated and on mechanical ventilation with encephalopathic state as well as rhythmic muscle activity, likely mild clonus.  Study is being performed to rule out possible focal seizure activity.  DESCRIPTION:  This is a routine EEG recording performed at the patient's bedside intensive care unit.  Predominant background activity consisted of low amplitude, continuous irregular delta and theta activity diffusely and symmetrically.  Significant muscle artifact was recorded throughout the EEG recording.  Photic stimulation produced no appreciable occipital driving response on either side.  No epileptiform discharges were recorded.  There were no areas of disproportionate focal slowing of brain activity.  INTERPRETATION:  The EEG showed severe generalized nonspecific slowing of cerebral activity diffusely and symmetrically.  This pattern of slowing is consistent with severe encephalopathic state.  No evidence of epileptic disorder was demonstrated.     Noel Christmas, MD    [id number] D:  06/20/2012 14:24:04  T:  06/21/2012 03:34:19  Job #:  [id number]

## 2012-07-16 NOTE — Progress Notes (Signed)
1635 Spoke to [employer], patient's sister, informed her of her brothers death.  States she is on her way to see patient.  Awaiting sisters arrival.  A. Day, RN

## 2012-07-16 NOTE — Progress Notes (Signed)
Name: Robert Mcclure MRN: [id number] DOB: 13-Dec-1945    LOS: 21  Referring Eusebio Blazejewski:  [name] NP Reason for Referral:  Hypoxic respiratory failure  PULMONARY / CRITICAL CARE MEDICINE    Brief patient description:  67 y/o with CVA, seizure d/o and HTN, intubated for respiratory failure following witnessed aspiration event. He was admitted on Jun 09, 2012 to the ICU on the PCCM service in status epilepticus (required intubation for airway protection). He was extubated on 2/19 and transferred to the hospitalist service on 2/21. He has had trouble with blood pressure management since transfer. He has also required an NG tube for medications and feeding. Early in the morning on 3/2 (around 1:30 am). He vomited what appeared to be coffee grounds. He then developed respiratory distress   Lines: 3/2 ETT>>>3/9 2/28 left  PICC>>>   ABX: vanc 3/2>>>3/4 Zosyn 3/2>>>3/4 Linazolid 3/4>>>3/5 Amp 3/4>>>  Micro: Sputum 3/2>>>amp sens enteroccus Sputum 3/2>>>same Urine 3/2>>> BC 3/2 x 4>>>  Events/ studies: 3/2 - vomited, reintubated 3/4- eeg neg focus, enceph 3/5- family meeting- NCB, no trach /peg option 3/6 eeg>>>still pending  Events Since Admission: Extubated, increased distress  Vital Signs: Temp:  [98.4 F (36.9 C)-100.8 F (38.2 C)] 99.8 F (37.7 C) (03/09 0800) Pulse Rate:  [94-118] 109 (03/09 0800) Resp:  [14-34] 29 (03/09 0800) BP: (87-196)/(62-123) 141/73 mmHg (03/09 0800) SpO2:  [94 %-100 %] 100 % (03/09 0800) FiO2 (%):  [40 %] 40 % (03/08 1000)  Physical Examination: General:  No purposeful movement, distress Neuro:  Unresponsive, distress HEENT: jvd wnl Neck:  Supple, no masses, jvd wnl Cardiovascular:  s1 s2 RRR, no mrg Lungs: increased ronchi Abdomen:  Soft, NTND, +BS Musculoskeletal:  No joint abnormalities Skin:  No rashes   Principal Problem:   Acute respiratory failure Active Problems:   HTN (hypertension), malignant   CVA (cerebral vascular  accident)   Diabetes mellitus   Aspiration pneumonia   Encephalopathy, metabolic   Dysphagia, unspecified   Altered mental status   Weakness generalized   ASSESSMENT AND PLAN  PULMONARY No results found for this basename: PHART, PCO2, PCO2ART, PO2ART, HCO3, O2SAT,  in the last 168 hours Ventilator Settings: Vent Mode:  [-]  FiO2 (%):  [40 %] 40 % No results found. PULM pcxr- bilat infiltrates  Improved hilum  A:  Aspiration pneumonitis Acute respiratory failure NO TRACH wished under any circumstance NOW distres post extubation atttempt P:   Start morphine drip as discussed with med poa daughter Titrate to rr 12-18 and pain  NEUROLOGIC  A:  S/p CVA, seizure d/o, dysphagia, eeg neg 3/4, prop off secondary to trig, LIkely poor fxnal recovery Transition to comfort / pall P:   Continue dilantin, lacosamide May need versed for control / comfort  Global: distress, start morphine, versed  Mcarthur Rossetti. Tyson Alias, MD, FACP Pgr: [phone] [facility]

## 2012-07-16 DEATH — deceased

## 2014-12-21 IMAGING — CR DG CHEST 1V PORT
1 series · 1 of 1 positions shown · non-contrast
Comparison: 06/17/2012

CLINICAL DATA: Check ETT

PORTABLE CHEST - 1 VIEW

[AP]
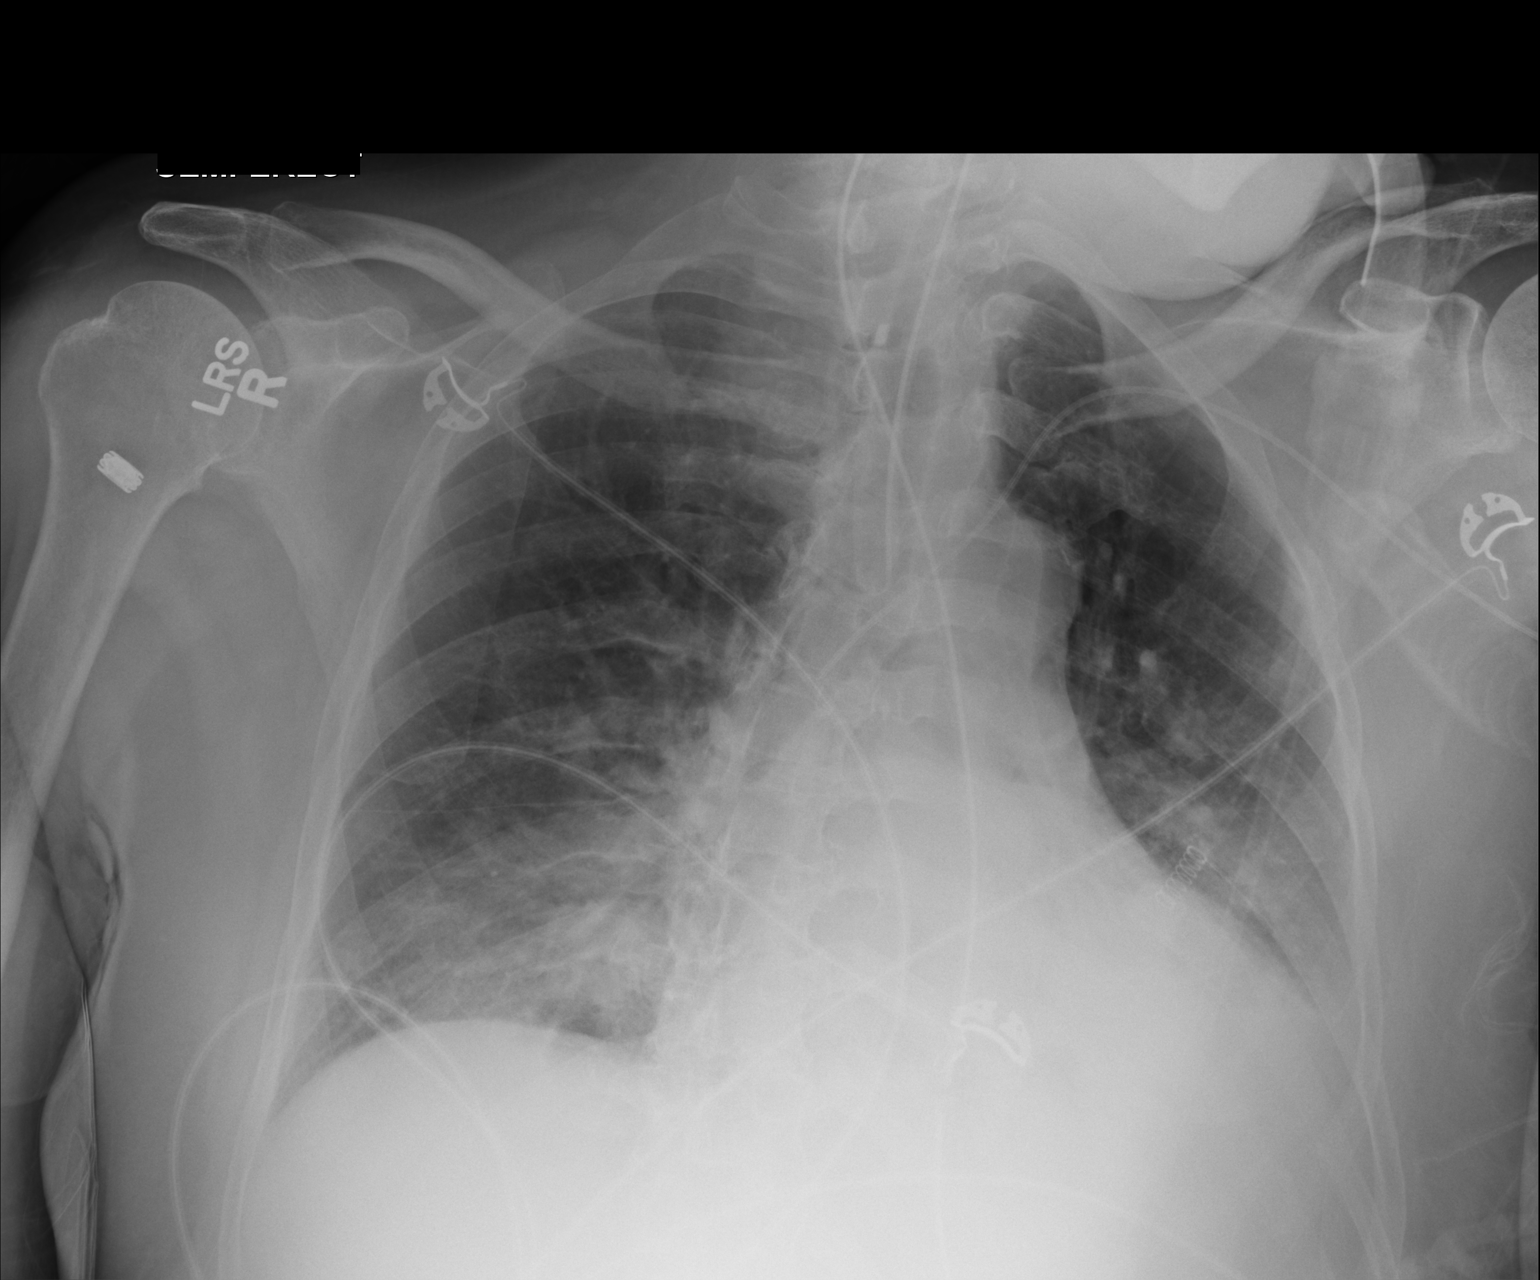

[1 of 1 positions shown; findings below may reference images not displayed]

FINDINGS: Endotracheal tube terminates 2 cm above the carina.

Enteric tube courses below the diaphragm.  Stable left arm PICC.
Patchy opacities within the left perihilar region and bilateral
lower lobes, suspicious for pneumonia.  Superimposed mild
interstitial edema is possible.  Small left pleural effusion.

No pneumothorax.

Cardiomegaly.
IMPRESSION: Endotracheal tube terminates 2 cm above the carina.

Patchy opacities within the left perihilar region and bilateral
lower lobes, suspicious for pneumonia.

Small left pleural effusion.
